# Patient Record
Sex: Female | Born: 1940 | Race: White | Hispanic: No | Smoking: Never smoker
Health system: Southern US, Community
[De-identification: ages and names within clinical notes are randomized; demographics above are authoritative.]

## PROBLEM LIST (undated history)

## (undated) DIAGNOSIS — T7840XA Allergy, unspecified, initial encounter: Secondary | ICD-10-CM

## (undated) DIAGNOSIS — I10 Essential (primary) hypertension: Secondary | ICD-10-CM

## (undated) HISTORY — PX: NO PAST SURGERIES: SHX2092

## (undated) HISTORY — DX: Allergy, unspecified, initial encounter: T78.40XA

## (undated) HISTORY — DX: Essential (primary) hypertension: I10

---

## 2005-11-29 ENCOUNTER — Emergency Department (HOSPITAL_COMMUNITY): Admission: EM | Admit: 2005-11-29 | Discharge: 2005-11-29 | Payer: Self-pay | Admitting: Emergency Medicine

## 2006-10-23 ENCOUNTER — Ambulatory Visit: Payer: Self-pay | Admitting: Family Medicine

## 2006-10-23 DIAGNOSIS — I10 Essential (primary) hypertension: Secondary | ICD-10-CM

## 2006-10-23 DIAGNOSIS — J309 Allergic rhinitis, unspecified: Secondary | ICD-10-CM | POA: Insufficient documentation

## 2006-10-26 LAB — CONVERTED CEMR LAB
BUN: 15 mg/dL (ref 6–23)
CO2: 33 meq/L — ABNORMAL HIGH (ref 19–32)
Calcium: 9.2 mg/dL (ref 8.4–10.5)
Chloride: 104 meq/L (ref 96–112)
Creatinine, Ser: 0.9 mg/dL (ref 0.4–1.2)

## 2007-01-11 ENCOUNTER — Encounter: Admission: RE | Admit: 2007-01-11 | Discharge: 2007-01-11 | Payer: Self-pay | Admitting: Family Medicine

## 2007-01-23 ENCOUNTER — Encounter: Payer: Self-pay | Admitting: Family Medicine

## 2007-02-06 ENCOUNTER — Encounter: Payer: Self-pay | Admitting: Family Medicine

## 2007-02-06 ENCOUNTER — Ambulatory Visit: Payer: Self-pay | Admitting: Family Medicine

## 2007-02-06 ENCOUNTER — Other Ambulatory Visit: Admission: RE | Admit: 2007-02-06 | Discharge: 2007-02-06 | Payer: Self-pay | Admitting: Family Medicine

## 2007-02-06 DIAGNOSIS — K921 Melena: Secondary | ICD-10-CM

## 2007-02-07 LAB — CONVERTED CEMR LAB
Alkaline Phosphatase: 109 units/L (ref 39–117)
Bilirubin, Direct: 0.1 mg/dL (ref 0.0–0.3)
CO2: 29 meq/L (ref 19–32)
Chloride: 103 meq/L (ref 96–112)
Creatinine, Ser: 0.8 mg/dL (ref 0.4–1.2)
Eosinophils Absolute: 0.1 10*3/uL (ref 0.0–0.6)
GFR calc Af Amer: 92 mL/min
HCT: 43.1 % (ref 36.0–46.0)
LDL Cholesterol: 89 mg/dL (ref 0–99)
MCHC: 33.6 g/dL (ref 30.0–36.0)
MCV: 89.1 fL (ref 78.0–100.0)
Monocytes Relative: 6.3 % (ref 3.0–11.0)
Neutrophils Relative %: 74.4 % (ref 43.0–77.0)
RBC: 4.84 M/uL (ref 3.87–5.11)
RDW: 12.1 % (ref 11.5–14.6)
Sodium: 140 meq/L (ref 135–145)
Total CHOL/HDL Ratio: 3.3
Triglycerides: 126 mg/dL (ref 0–149)
VLDL: 25 mg/dL (ref 0–40)

## 2007-02-10 DIAGNOSIS — R87619 Unspecified abnormal cytological findings in specimens from cervix uteri: Secondary | ICD-10-CM

## 2007-05-18 ENCOUNTER — Telehealth (INDEPENDENT_AMBULATORY_CARE_PROVIDER_SITE_OTHER): Payer: Self-pay | Admitting: *Deleted

## 2007-05-20 ENCOUNTER — Emergency Department (HOSPITAL_COMMUNITY): Admission: EM | Admit: 2007-05-20 | Discharge: 2007-05-20 | Payer: Self-pay | Admitting: Family Medicine

## 2008-01-23 ENCOUNTER — Encounter: Payer: Self-pay | Admitting: Family Medicine

## 2008-01-24 ENCOUNTER — Ambulatory Visit: Payer: Self-pay | Admitting: Family Medicine

## 2008-01-25 ENCOUNTER — Encounter: Payer: Self-pay | Admitting: Family Medicine

## 2008-01-28 ENCOUNTER — Encounter (INDEPENDENT_AMBULATORY_CARE_PROVIDER_SITE_OTHER): Payer: Self-pay | Admitting: *Deleted

## 2008-01-30 ENCOUNTER — Encounter (INDEPENDENT_AMBULATORY_CARE_PROVIDER_SITE_OTHER): Payer: Self-pay | Admitting: *Deleted

## 2008-01-30 LAB — CONVERTED CEMR LAB
BUN: 17 mg/dL (ref 6–23)
Basophils Relative: 0.1 % (ref 0.0–3.0)
Bilirubin, Direct: 0.1 mg/dL (ref 0.0–0.3)
Calcium: 9.6 mg/dL (ref 8.4–10.5)
Chloride: 105 meq/L (ref 96–112)
Creatinine, Ser: 1.1 mg/dL (ref 0.4–1.2)
Eosinophils Absolute: 0.1 10*3/uL (ref 0.0–0.7)
GFR calc non Af Amer: 52 mL/min
Glucose, Bld: 117 mg/dL — ABNORMAL HIGH (ref 70–99)
HCT: 44 % (ref 36.0–46.0)
Hemoglobin: 15 g/dL (ref 12.0–15.0)
Lymphocytes Relative: 15.9 % (ref 12.0–46.0)
MCV: 88 fL (ref 78.0–100.0)
Neutro Abs: 6.2 10*3/uL (ref 1.4–7.7)
Neutrophils Relative %: 77.2 % — ABNORMAL HIGH (ref 43.0–77.0)
Platelets: 256 10*3/uL (ref 150–400)
RDW: 12.2 % (ref 11.5–14.6)
TSH: 2.63 microintl units/mL (ref 0.35–5.50)
Total CHOL/HDL Ratio: 3.2
Triglycerides: 130 mg/dL (ref 0–149)
WBC: 8.1 10*3/uL (ref 4.5–10.5)

## 2008-10-16 ENCOUNTER — Ambulatory Visit: Payer: Self-pay | Admitting: Family Medicine

## 2008-10-23 ENCOUNTER — Ambulatory Visit: Payer: Self-pay | Admitting: Family Medicine

## 2008-10-23 LAB — CONVERTED CEMR LAB
AST: 19 units/L (ref 0–37)
BUN: 20 mg/dL (ref 6–23)
Bilirubin, Direct: 0.1 mg/dL (ref 0.0–0.3)
Chloride: 108 meq/L (ref 96–112)
Cholesterol: 152 mg/dL (ref 0–200)
GFR calc non Af Amer: 47.19 mL/min (ref >60)
Glucose, Bld: 88 mg/dL (ref 70–99)
LDL Cholesterol: 78 mg/dL (ref 0–99)
Potassium: 4.2 meq/L (ref 3.5–5.1)
Total Bilirubin: 0.9 mg/dL (ref 0.3–1.2)
Total CHOL/HDL Ratio: 3
Total Protein: 6.9 g/dL (ref 6.0–8.3)

## 2008-10-27 ENCOUNTER — Encounter (INDEPENDENT_AMBULATORY_CARE_PROVIDER_SITE_OTHER): Payer: Self-pay | Admitting: *Deleted

## 2009-05-25 ENCOUNTER — Telehealth (INDEPENDENT_AMBULATORY_CARE_PROVIDER_SITE_OTHER): Payer: Self-pay | Admitting: *Deleted

## 2009-06-04 ENCOUNTER — Ambulatory Visit: Payer: Self-pay | Admitting: Family Medicine

## 2009-10-23 ENCOUNTER — Ambulatory Visit: Payer: Self-pay | Admitting: Family Medicine

## 2009-10-23 DIAGNOSIS — R7309 Other abnormal glucose: Secondary | ICD-10-CM

## 2009-11-02 LAB — CONVERTED CEMR LAB
ALT: 16 units/L (ref 0–35)
AST: 21 units/L (ref 0–37)
BUN: 20 mg/dL (ref 6–23)
Chloride: 106 meq/L (ref 96–112)
Cholesterol: 166 mg/dL (ref 0–200)
Eosinophils Absolute: 0.1 10*3/uL (ref 0.0–0.7)
Eosinophils Relative: 1.7 % (ref 0.0–5.0)
GFR calc non Af Amer: 57.41 mL/min (ref >60)
Glucose, Bld: 102 mg/dL — ABNORMAL HIGH (ref 70–99)
LDL Cholesterol: 85 mg/dL (ref 0–99)
Lymphocytes Relative: 22.1 % (ref 12.0–46.0)
Lymphs Abs: 1.2 10*3/uL (ref 0.7–4.0)
MCHC: 34.1 g/dL (ref 30.0–36.0)
Monocytes Absolute: 0.5 10*3/uL (ref 0.1–1.0)
Monocytes Relative: 9.4 % (ref 3.0–12.0)
Neutro Abs: 3.7 10*3/uL (ref 1.4–7.7)
RBC: 4.42 M/uL (ref 3.87–5.11)
Total Bilirubin: 0.7 mg/dL (ref 0.3–1.2)
Total Protein: 6.9 g/dL (ref 6.0–8.3)
Triglycerides: 114 mg/dL (ref 0.0–149.0)
WBC: 5.6 10*3/uL (ref 4.5–10.5)

## 2010-05-06 NOTE — Progress Notes (Signed)
Summary: Phone  Phone Note Call from Patient Call back at [phone]   Caller: Patient Summary of Call: Patient requesting labs stated she requested a med refill on amlodpine. Please advise which labs Initial call taken by: Silva Bandy,  May 25, 2009 12:33 PM  Follow-up for Phone Call        pt need OV per note attached to refills. labs not due until 10-23-09, last OV 10-16-08.Marland KitchenMarland KitchenFelecia Deloach CMA  May 25, 2009 12:46 PM     Additional Follow-up for Phone Call Additional follow up Details #2::    patient has an ov scheduled for Feb 24,2011 and labs scheduled on July 22,2011 Follow-up by: Silva Bandy,  May 25, 2009 1:07 PM

## 2010-05-06 NOTE — Assessment & Plan Note (Signed)
Summary: [facility]/LABS-790.6,272.4,401.9,LIP,BMP,HEP,HGBA1C--PH   Vital Signs:  Patient profile:   70 year old female Height:      62.75 inches Weight:      124 pounds BMI:     22.22 Temp:     98.3 degrees F oral Pulse rate:   68 / minute BP sitting:   130 / 76  (left arm)  Vitals Entered By: Rolla Flatten CMA (October 23, 2009 8:12 AM) CC: fasting labs   History of Present Illness:  Hypertension follow-up      This is a 70 year old woman who presents for Hypertension follow-up.  The patient denies lightheadedness, urinary frequency, headaches, edema, impotence, rash, and fatigue.  The patient denies the following associated symptoms: chest pain, chest pressure, exercise intolerance, dyspnea, palpitations, syncope, leg edema, and pedal edema.  Compliance with medications (by patient report) has been near 100%.  The patient reports that dietary compliance has been good.  The patient reports exercising 3-4X per week.  Adjunctive measures currently used by the patient include salt restriction.    Current Medications (verified): 1)  Norvasc 10 Mg  Tabs (Amlodipine Besylate) .Marland Kitchen.. 1 Once Daily. 2)  Lisinopril 20 Mg Tabs (Lisinopril) .Marland Kitchen.. 1 By Mouth Once Daily 3)  Ibuprofen 200 Mg Caps (Ibuprofen) 4)  Ranitidine Hcl 150 Mg Tabs (Ranitidine Hcl)  Allergies (verified): No Known Drug Allergies  Past History:  Past Medical History: Last updated: 10/23/2006 Allergic rhinitis Hypertension  Past Surgical History: Last updated: 10/23/2006 Denies surgical history  Family History: Last updated: 10/23/2006 Family History Hypertension- MOTHER AND FATHER  Family History of Arthritis-FATHER Family History Depression-MOTHER  Social History: Last updated: 10/23/2009  Single Never Smoked Alcohol use-no Drug use-no  Regular exercise-no  Risk Factors: Caffeine Use: 0 (06/04/2009) Exercise: no (06/04/2009)  Risk Factors: Smoking Status: never (06/04/2009) Passive Smoke Exposure:  no (06/04/2009)  Family History: Reviewed history from 10/23/2006 and no changes required. Family History Hypertension- MOTHER AND FATHER  Family History of Arthritis-FATHER Family History Depression-MOTHER  Social History:  Single Never Smoked Alcohol use-no Drug use-no  Regular exercise-no  Review of Systems      See HPI  Physical Exam  General:  Well-developed,well-nourished,in no acute distress; alert,appropriate and cooperative throughout examination Neck:  No deformities, masses, or tenderness noted. Lungs:  Normal respiratory effort, chest expands symmetrically. Lungs are clear to auscultation, no crackles or wheezes. Heart:  normal rate and no murmur.   Extremities:  No clubbing, cyanosis, edema, or deformity noted with normal full range of motion of all joints.   Psych:  Oriented X3 and flat affect.     Impression & Recommendations:  Problem # 1:  HYPERTENSION (ICD-401.9)  Her updated medication list for this problem includes:    Norvasc 10 Mg Tabs (Amlodipine besylate) .Marland Kitchen... 1 once daily.    Lisinopril 20 Mg Tabs (Lisinopril) .Marland Kitchen... 1 by mouth once daily  Orders: Venipuncture (40768) TLB-Lipid Panel (80061-LIPID) TLB-BMP (Basic Metabolic Panel-BMET) (08811-SRPRXYV) TLB-CBC Platelet - w/Differential (85025-CBCD) TLB-Hepatic/Liver Function Pnl (80076-HEPATIC) TLB-A1C / Hgb A1C (Glycohemoglobin) (83036-A1C)  BP today: 130/76 Prior BP: 124/78 (06/04/2009)  Labs Reviewed: K+: 4.2 (10/23/2008) Creat: : 1.2 (10/23/2008)   Chol: 152 (10/23/2008)   HDL: 59.30 (10/23/2008)   LDL: 78 (10/23/2008)   TG: 76.0 (10/23/2008)  Problem # 2:  [place] (ICD-V70.0) pt refused vaccines, pap, mammo, bmd, colon or ifob.  Complete Medication List: 1)  Norvasc 10 Mg Tabs (Amlodipine besylate) .Marland Kitchen.. 1 once daily. 2)  Lisinopril 20 Mg Tabs (Lisinopril) .Marland KitchenMarland KitchenMarland Kitchen  1 by mouth once daily 3)  Ibuprofen 200 Mg Caps (Ibuprofen) 4)  Ranitidine Hcl 150 Mg Tabs (Ranitidine  hcl) Prescriptions: NORVASC 10 MG  TABS (AMLODIPINE BESYLATE) 1 once daily.  #30 x 5   Entered and Authorized by:   [name] DO   Signed by:   [name] DO on 10/23/2009   Method used:   Electronically to        [facility] (retail)       [address]       [place], [place]  [address]       Ph: [phone]       Fax: [phone]   RxID:   [id number]   Flu Vaccine Next Due:  Refused TD Next Due:  Refused Pneumovax Next Due:  Refused Herpes Zoster Next Due:  Not Indicated Last Colonoscopy:  Normal (10/09/1991 10:49:15 AM) Colonoscopy Next Due:  Refused Last PAP:  Normal (10/13/1999 10:49:45 AM) PAP Next Due:  Not Indicated Last Mammogram:  Abnormal Right (01/16/2007 10:28:50 AM) Mammogram Next Due:  Refused Bone Density Next Due: Refused    Appended Document: [facility]/LABS-790.6,272.4,401.9,LIP,BMP,HEP,HGBA1C--PH    Clinical Lists Changes  Orders: Added new Service order of Specimen Handling (99000) - Signed      Appended Document: [facility]/LABS-790.6,272.4,401.9,LIP,BMP,HEP,HGBA1C--PH    Clinical Lists Changes  Orders: Added new Service order of UA Dipstick w/o Micro (manual) (81002) - Signed Observations: Added new observation of [place] URINE: 5.0  (10/23/2009 9:20) Added new observation of SPEC GR URIN: 1.010  (10/23/2009 9:20) Added new observation of WBC DIPSTK U: negative  (10/23/2009 9:20) Added new observation of NITRITE URN: negative  (10/23/2009 9:20) Added new observation of UROBILINOGEN: 0.2  (10/23/2009 9:20) Added new observation of PROTEIN, URN: negative  (10/23/2009 9:20) Added new observation of BLOOD UR DIP: negative  (10/23/2009 9:20) Added new observation of KETONES URN: negative  (10/23/2009 9:20) Added new observation of BILIRUBIN UR: negative  (10/23/2009 9:20) Added new observation of GLUCOSE, URN: negative  (10/23/2009 9:20)      Laboratory Results   Urine Tests    Routine  Urinalysis   Glucose: negative   (Normal Range: Negative) Bilirubin: negative   (Normal Range: Negative) Ketone: negative   (Normal Range: Negative) Spec. Gravity: 1.010   (Normal Range: 1.003-1.035) Blood: negative   (Normal Range: Negative) pH: 5.0   (Normal Range: 5.0-8.0) Protein: negative   (Normal Range: Negative) Urobilinogen: 0.2   (Normal Range: 0-1) Nitrite: negative   (Normal Range: Negative) Leukocyte Esterace: negative   (Normal Range: Negative)

## 2010-05-06 NOTE — Assessment & Plan Note (Signed)
Summary: ov for refills/kdc   Vital Signs:  Patient profile:   70 year old female Weight:      117 pounds Temp:     97.4 degrees F oral Pulse rate:   76 / minute Pulse rhythm:   regular BP sitting:   124 / 78  (left arm) Cuff size:   regular  Vitals Entered By: Allyn Kenner CMA (June 04, 2009 11:04 AM) CC: refill on meds    History of Present Illness:  Hypertension follow-up      This is a 70 year old woman who presents for Hypertension follow-up.  The patient denies lightheadedness, urinary frequency, headaches, edema, impotence, rash, and fatigue.  The patient denies the following associated symptoms: chest pain, chest pressure, exercise intolerance, dyspnea, palpitations, syncope, leg edema, and pedal edema.  Compliance with medications (by patient report) has been near 100%.  The patient reports that dietary compliance has been good.  The patient reports exercising 3-4X per week.  Adjunctive measures currently used by the patient include salt restriction.    Preventive Screening-Counseling & Management  Alcohol-Tobacco     Smoking Status: never     Passive Smoke Exposure: no  Caffeine-Diet-Exercise     Caffeine use/day: 0     Does Patient Exercise: no     Type of exercise: walking     Times/week: <3  Current Medications (verified): 1)  Norvasc 10 Mg  Tabs (Amlodipine Besylate) .Marland Kitchen.. 1 Once Daily. 2)  Lisinopril 20 Mg Tabs (Lisinopril) .Marland Kitchen.. 1 By Mouth Once Daily 3)  Ibuprofen 200 Mg Caps (Ibuprofen) 4)  Ranitidine Hcl 150 Mg Tabs (Ranitidine Hcl)  Allergies (verified): No Known Drug Allergies  Past History:  Past medical, surgical, family and social histories (including risk factors) reviewed for relevance to current acute and chronic problems.  Past Medical History: Reviewed history from 10/23/2006 and no changes required. Allergic rhinitis Hypertension  Past Surgical History: Reviewed history from 10/23/2006 and no changes required. Denies surgical  history  Family History: Reviewed history from 10/23/2006 and no changes required. Family History Hypertension- MOTHER AND FATHER  Family History of Arthritis-FATHER Family History Depression-MOTHER  Social History: Reviewed history from 01/24/2008 and no changes required. Occupation:  [employer] Single Never Smoked Alcohol use-no Drug use-no  Regular exercise-no Does Patient Exercise:  no  Review of Systems      See HPI  Physical Exam  General:  Well-developed,well-nourished,in no acute distress; alert,appropriate and cooperative throughout examination Lungs:  Normal respiratory effort, chest expands symmetrically. Lungs are clear to auscultation, no crackles or wheezes. Heart:  normal rate and no murmur.   Extremities:  No clubbing, cyanosis, edema, or deformity noted with normal full range of motion of all joints.   Psych:  Oriented X3 and normally interactive.     Impression & Recommendations:  Problem # 1:  HYPERTENSION (ICD-401.9)  Her updated medication list for this problem includes:    Norvasc 10 Mg Tabs (Amlodipine besylate) .Marland Kitchen... 1 once daily.    Lisinopril 20 Mg Tabs (Lisinopril) .Marland Kitchen... 1 by mouth once daily  BP today: 124/78 Prior BP: 124/72 (10/16/2008)  Labs Reviewed: K+: 4.2 (10/23/2008) Creat: : 1.2 (10/23/2008)   Chol: 152 (10/23/2008)   HDL: 59.30 (10/23/2008)   LDL: 78 (10/23/2008)   TG: 76.0 (10/23/2008)  Complete Medication List: 1)  Norvasc 10 Mg Tabs (Amlodipine besylate) .Marland Kitchen.. 1 once daily. 2)  Lisinopril 20 Mg Tabs (Lisinopril) .Marland Kitchen.. 1 by mouth once daily 3)  Ibuprofen 200 Mg Caps (Ibuprofen)  4)  Ranitidine Hcl 150 Mg Tabs (Ranitidine hcl)  Patient Instructions: 1)  cpe july Prescriptions: NORVASC 10 MG  TABS (AMLODIPINE BESYLATE) 1 once daily.  #30 x 5   Entered and Authorized by:   [name] DO   Signed by:   [name] DO on 06/04/2009   Method used:   Electronically to        [facility] (retail)        [address]       [place], [place]  [address]       Ph: [phone]       Fax: [phone]   RxID:   [id number]

## 2010-05-17 ENCOUNTER — Encounter (INDEPENDENT_AMBULATORY_CARE_PROVIDER_SITE_OTHER): Payer: Self-pay | Admitting: *Deleted

## 2010-05-24 ENCOUNTER — Ambulatory Visit: Payer: Self-pay | Admitting: Family Medicine

## 2010-05-26 NOTE — Letter (Signed)
Summary: Primary Care Appointment Letter  [facility]   [place], [place] [address]   Phone: [phone]  Fax: [phone]    05/17/2010 MRN: [id number]    [facility] [place], [place]  [address]     Dear [name],     Your Primary Care Physician [name] DO has indicated that:    ___X____it is time to schedule an appointment for your Annual physical with fastin labs.    _______you missed your appointment on______ and need to call and          reschedule.    _______you need to have lab work done.    _______you need to schedule an appointment discuss lab or test results.    _______you need to call to reschedule your appointment that is                       scheduled on _________.     Please call our office as soon as possible. Our phone number is [phone]. Please press option 1. Our office is open 8a-5p, Monday through Friday.     Thank you,  [place]

## 2010-05-27 ENCOUNTER — Encounter: Payer: Self-pay | Admitting: Family Medicine

## 2010-05-27 ENCOUNTER — Ambulatory Visit (INDEPENDENT_AMBULATORY_CARE_PROVIDER_SITE_OTHER): Payer: Medicare Other | Admitting: Family Medicine

## 2010-05-27 ENCOUNTER — Other Ambulatory Visit: Payer: Self-pay | Admitting: Family Medicine

## 2010-05-27 DIAGNOSIS — I10 Essential (primary) hypertension: Secondary | ICD-10-CM

## 2010-05-27 DIAGNOSIS — R7309 Other abnormal glucose: Secondary | ICD-10-CM

## 2010-05-27 LAB — HEPATIC FUNCTION PANEL
ALT: 20 U/L (ref 0–35)
AST: 20 U/L (ref 0–37)
Albumin: 3.9 g/dL (ref 3.5–5.2)
Alkaline Phosphatase: 84 U/L (ref 39–117)
Bilirubin, Direct: 0.1 mg/dL (ref 0.0–0.3)
Total Bilirubin: 0.6 mg/dL (ref 0.3–1.2)
Total Protein: 6.5 g/dL (ref 6.0–8.3)

## 2010-05-27 LAB — BASIC METABOLIC PANEL WITH GFR
BUN: 21 mg/dL (ref 6–23)
CO2: 30 meq/L (ref 19–32)
Calcium: 9.7 mg/dL (ref 8.4–10.5)
Chloride: 106 meq/L (ref 96–112)
Creatinine, Ser: 1.1 mg/dL (ref 0.4–1.2)
GFR: 54.8 mL/min — ABNORMAL LOW (ref >60.00)
Glucose, Bld: 89 mg/dL (ref 70–99)
Potassium: 4.7 meq/L (ref 3.5–5.1)
Sodium: 140 meq/L (ref 135–145)

## 2010-05-27 LAB — HEMOGLOBIN A1C: Hgb A1c MFr Bld: 5.9 % (ref 4.6–6.5)

## 2010-05-27 LAB — LIPID PANEL: LDL Cholesterol: 84 mg/dL (ref 0–99)

## 2010-06-01 NOTE — Assessment & Plan Note (Signed)
Summary: med refill/cbs   Vital Signs:  Patient profile:   70 year old female Weight:      117.4 pounds Pulse rate:   76 / minute Pulse rhythm:   regular BP sitting:   114 / 78  (left arm) Cuff size:   regular  Vitals Entered By: Aron Baba CMA Deborra Medina) (May 27, 2010 9:57 AM) CC: ROV---MED REVIEW AND FASTING LABS   History of Present Illness:  Hypertension follow-up      This is a 70 year old woman who presents for Hypertension follow-up.  The patient denies lightheadedness, urinary frequency, headaches, edema, impotence, rash, and fatigue.  The patient denies the following associated symptoms: chest pain, chest pressure, exercise intolerance, dyspnea, palpitations, syncope, leg edema, and pedal edema.  Compliance with medications (by patient report) has been near 100%.  The patient reports that dietary compliance has been good.  The patient reports exercising occasionally.  Adjunctive measures currently used by the patient include salt restriction.    Current Medications (verified): 1)  Norvasc 10 Mg  Tabs (Amlodipine Besylate) .Marland Kitchen.. 1 Once Daily. Labs Are Due Now_call For and Appointment! 2)  Lisinopril 20 Mg Tabs (Lisinopril) .Marland Kitchen.. 1 By Mouth Once Daily 3)  Ibuprofen 200 Mg Caps (Ibuprofen)  Allergies (verified): No Known Drug Allergies  Physical Exam  General:  Well-developed,well-nourished,in no acute distress; alert,appropriate and cooperative throughout examination Lungs:  Normal respiratory effort, chest expands symmetrically. Lungs are clear to auscultation, no crackles or wheezes. Heart:  normal rate and no murmur.   Extremities:  No clubbing, cyanosis, edema, or deformity noted with normal full range of motion of all joints.   Psych:  Oriented X3 and normally interactive.     Impression & Recommendations:  Problem # 1:  HYPERGLYCEMIA (ICD-790.29)  Orders: Venipuncture (77939) TLB-Lipid Panel (80061-LIPID) TLB-BMP (Basic Metabolic Panel-BMET)  (03009-QZRAQTM) TLB-Hepatic/Liver Function Pnl (80076-HEPATIC) TLB-A1C / Hgb A1C (Glycohemoglobin) (83036-A1C) Specimen Handling (99000)  Labs Reviewed: Creat: 1.0 (10/23/2009)     Problem # 2:  HYPERTENSION (ICD-401.9)  Her updated medication list for this problem includes:    Norvasc 10 Mg Tabs (Amlodipine besylate) .Marland Kitchen... 1 once daily.    Lisinopril 20 Mg Tabs (Lisinopril) .Marland Kitchen... 1 by mouth once daily  Orders: Venipuncture (22633) TLB-Lipid Panel (80061-LIPID) TLB-BMP (Basic Metabolic Panel-BMET) (35456-YBWLSLH) TLB-Hepatic/Liver Function Pnl (80076-HEPATIC) TLB-A1C / Hgb A1C (Glycohemoglobin) (83036-A1C) Specimen Handling (99000)  BP today: 114/78 Prior BP: 130/76 (10/23/2009)  Labs Reviewed: K+: 4.7 (10/23/2009) Creat: : 1.0 (10/23/2009)   Chol: 166 (10/23/2009)   HDL: 58.00 (10/23/2009)   LDL: 85 (10/23/2009)   TG: 114.0 (10/23/2009)  Complete Medication List: 1)  Norvasc 10 Mg Tabs (Amlodipine besylate) .Marland Kitchen.. 1 once daily. 2)  Lisinopril 20 Mg Tabs (Lisinopril) .Marland Kitchen.. 1 by mouth once daily 3)  Ibuprofen 200 Mg Caps (Ibuprofen)  Patient Instructions: 1)  Please schedule a follow-up appointment in 6 months .  Prescriptions: NORVASC 10 MG  TABS (AMLODIPINE BESYLATE) 1 once daily.  #90 x 3   Entered and Authorized by:   [name] DO   Signed by:   [name] DO on 05/27/2010   Method used:   Electronically to        [facility] (retail)       [address]       [place], [place]  [address]       Ph: [phone]       Fax: [phone]   RxID:  [id number] LISINOPRIL 20 MG TABS (LISINOPRIL) 1 by mouth once daily  #90 Tablet x 3   Entered and Authorized by:   [name] DO   Signed by:   [name] DO on 05/27/2010   Method used:   Electronically to        [facility] (retail)       [address]       [place], [place]  [address]       Ph: [phone]       Fax: [phone]    RxID:   [id number]    Orders Added: 1)  Venipuncture [78412] 2)  TLB-Lipid Panel [80061-LIPID] 3)  TLB-BMP (Basic Metabolic Panel-BMET) [82081-NGITJLL] 4)  TLB-Hepatic/Liver Function Pnl [80076-HEPATIC] 5)  TLB-A1C / Hgb A1C (Glycohemoglobin) [83036-A1C] 6)  Specimen Handling [99000] 7)  Est. Patient Level III [97471]

## 2010-11-01 ENCOUNTER — Ambulatory Visit (INDEPENDENT_AMBULATORY_CARE_PROVIDER_SITE_OTHER): Payer: Medicare Other | Admitting: Family Medicine

## 2010-11-01 ENCOUNTER — Encounter: Payer: Self-pay | Admitting: Family Medicine

## 2010-11-01 VITALS — BP 132/74 | Temp 99.2°F | Ht 62.0 in | Wt 117.4 lb

## 2010-11-01 DIAGNOSIS — R05 Cough: Secondary | ICD-10-CM | POA: Insufficient documentation

## 2010-11-01 MED ORDER — BENZONATATE 200 MG PO CAPS: 200.0000 mg | ORAL_CAPSULE | Freq: Three times a day (TID) | ORAL | Status: AC | PRN

## 2010-11-01 MED ORDER — AZITHROMYCIN 250 MG PO TABS: 250.0000 mg | ORAL_TABLET | Freq: Every day | ORAL | Status: AC

## 2010-11-01 NOTE — Patient Instructions (Signed)
Start the Azithromycin for possible pneumonia Go to [facility] tomorrow and get your chest xray- we'll call you with the results Use the cough pills as needed Continue to alternate the tylenol and ibuprofen every 4 hours as needed for pain relief Use a heating pad for pain relief Call with any questions or concerns Hang in there!!!

## 2010-11-01 NOTE — Progress Notes (Signed)
  Subjective:    Patient ID: Morgan Morrow, female    DOB: [birth year], 70 y.o.   MRN: [id number]  HPI Chest congestion- sxs started 10-11 days ago after traveling.  Pt reports cough is very 'wet and loose'.  Denies recent fevers.  Reports this AM developed severe thoracic back pain on R side.  Denies ear pain, sore throat, facial pain/pressure.  Has been alternating tylenol and ibuprofen for pain.   Review of Systems For ROS see HPI     Objective:   Physical Exam  Vitals reviewed. Constitutional: She appears well-developed and well-nourished. No distress.  HENT:  Head: Normocephalic and atraumatic.       TMs normal bilaterally no nasal congestion Throat w/out erythema, edema, or exudate  Eyes: Conjunctivae and EOM are normal. Pupils are equal, round, and reactive to light.  Neck: Normal range of motion. Neck supple.  Cardiovascular: Normal rate, regular rhythm and intact distal pulses.   Pulmonary/Chest: Effort normal. No respiratory distress. She has no wheezes. She has rales (coarse BS, R>L).       + loose,hacking cough  Musculoskeletal: She exhibits no tenderness (no TTP over thoracic spine, + pain w/ cough).  Lymphadenopathy:    She has no cervical adenopathy.          Assessment & Plan:   No problem-specific assessment & plan notes found for this encounter.

## 2010-11-02 ENCOUNTER — Ambulatory Visit (INDEPENDENT_AMBULATORY_CARE_PROVIDER_SITE_OTHER)
Admission: RE | Admit: 2010-11-02 | Discharge: 2010-11-02 | Disposition: A | Discharge status: Home / Self Care | Payer: Medicare Other | Source: Ambulatory Visit | Attending: Family Medicine | Admitting: Family Medicine

## 2010-11-02 DIAGNOSIS — R05 Cough: Secondary | ICD-10-CM

## 2010-11-09 NOTE — Assessment & Plan Note (Signed)
Given pt's duration of illness, character of cough, and sudden back pain there is a high suspicion for PNA.  Start cough meds and abx.  Send pt for CXR.  Reviewed supportive care and red flags that should prompt return.  Pt expressed understanding and is in agreement w/ plan.

## 2011-06-02 ENCOUNTER — Other Ambulatory Visit: Payer: Self-pay | Admitting: Family Medicine

## 2011-06-02 NOTE — Telephone Encounter (Signed)
Letter mailed to schedule and OV     KP

## 2011-07-08 ENCOUNTER — Other Ambulatory Visit: Payer: Self-pay | Admitting: Family Medicine

## 2011-07-27 ENCOUNTER — Encounter: Payer: Self-pay | Admitting: Family Medicine

## 2011-07-27 ENCOUNTER — Ambulatory Visit (INDEPENDENT_AMBULATORY_CARE_PROVIDER_SITE_OTHER): Payer: Medicare Other | Admitting: Family Medicine

## 2011-07-27 VITALS — BP 122/78 | HR 88 | Temp 97.8°F | Ht 62.0 in | Wt 120.6 lb

## 2011-07-27 DIAGNOSIS — I10 Essential (primary) hypertension: Secondary | ICD-10-CM

## 2011-07-27 MED ORDER — AMLODIPINE BESYLATE 10 MG PO TABS: 10.0000 mg | ORAL_TABLET | Freq: Every day | ORAL | Status: DC

## 2011-07-27 MED ORDER — LISINOPRIL 20 MG PO TABS: 20.0000 mg | ORAL_TABLET | Freq: Every day | ORAL | Status: DC

## 2011-07-27 NOTE — Assessment & Plan Note (Signed)
Chronic problem.  Well controlled.  Asymptomatic.  Needs CPE w/ PCP.  No changes.

## 2011-07-27 NOTE — Patient Instructions (Signed)
Schedule your complete physical w/ Dr Etter Sjogren You look good!  Keep it up! Call with any questions or concerns [place]!!!

## 2011-07-27 NOTE — Progress Notes (Signed)
  Subjective:    Patient ID: Samayah Novinger, female    DOB: 02-13-1941, 71 y.o.   MRN: [id number]  HPI HTN- chronic problem for pt, excellent control on Amlodipine and Lisinopril.  No CP, SOB, HAs, visual changes, edema.  Overdue for CPE.  No concerns about health.   Review of Systems For ROS see HPI     Objective:   Physical Exam  Vitals reviewed. Constitutional: She is oriented to person, place, and time. She appears well-developed and well-nourished. No distress.  HENT:  Head: Normocephalic and atraumatic.  Eyes: Conjunctivae and EOM are normal. Pupils are equal, round, and reactive to light.  Neck: Normal range of motion. Neck supple. No thyromegaly present.  Cardiovascular: Normal rate, regular rhythm, normal heart sounds and intact distal pulses.   No murmur heard. Pulmonary/Chest: Effort normal and breath sounds normal. No respiratory distress.  Abdominal: Soft. She exhibits no distension. There is no tenderness.  Musculoskeletal: She exhibits no edema.  Lymphadenopathy:    She has no cervical adenopathy.  Neurological: She is alert and oriented to person, place, and time.  Skin: Skin is warm and dry.  Psychiatric: She has a normal mood and affect. Her behavior is normal.          Assessment & Plan:

## 2011-09-16 ENCOUNTER — Encounter: Payer: Self-pay | Admitting: Family Medicine

## 2011-09-16 ENCOUNTER — Ambulatory Visit (INDEPENDENT_AMBULATORY_CARE_PROVIDER_SITE_OTHER): Payer: Medicare Other | Admitting: Family Medicine

## 2011-09-16 VITALS — BP 134/72 | HR 75 | Temp 97.3°F | Ht 61.5 in | Wt 121.8 lb

## 2011-09-16 DIAGNOSIS — R7309 Other abnormal glucose: Secondary | ICD-10-CM

## 2011-09-16 DIAGNOSIS — R739 Hyperglycemia, unspecified: Secondary | ICD-10-CM

## 2011-09-16 DIAGNOSIS — Z0000 Encounter for general adult medical examination without abnormal findings: Secondary | ICD-10-CM

## 2011-09-16 DIAGNOSIS — I10 Essential (primary) hypertension: Secondary | ICD-10-CM

## 2011-09-16 LAB — CBC WITH DIFFERENTIAL/PLATELET
Hemoglobin: 13 g/dL (ref 12.0–15.0)
Monocytes Absolute: 0.4 10*3/uL (ref 0.1–1.0)
Neutrophils Relative %: 66.2 % (ref 43.0–77.0)
Platelets: 199 10*3/uL (ref 150.0–400.0)
RBC: 4.42 Mil/uL (ref 3.87–5.11)
RDW: 13.7 % (ref 11.5–14.6)
WBC: 5.4 10*3/uL (ref 4.5–10.5)

## 2011-09-16 LAB — BASIC METABOLIC PANEL
Calcium: 9.4 mg/dL (ref 8.4–10.5)
Chloride: 106 mEq/L (ref 96–112)
Creatinine, Ser: 1.1 mg/dL (ref 0.4–1.2)
Glucose, Bld: 85 mg/dL (ref 70–99)
Sodium: 140 mEq/L (ref 135–145)

## 2011-09-16 LAB — HEPATIC FUNCTION PANEL
ALT: 16 U/L (ref 0–35)
Albumin: 3.7 g/dL (ref 3.5–5.2)
Alkaline Phosphatase: 85 U/L (ref 39–117)
Bilirubin, Direct: 0.1 mg/dL (ref 0.0–0.3)
Total Protein: 6.9 g/dL (ref 6.0–8.3)

## 2011-09-16 LAB — LIPID PANEL
Cholesterol: 157 mg/dL (ref 0–200)
LDL Cholesterol: 75 mg/dL (ref 0–99)
VLDL: 17.2 mg/dL (ref 0.0–40.0)

## 2011-09-16 MED ORDER — LISINOPRIL 20 MG PO TABS: 20.0000 mg | ORAL_TABLET | Freq: Every day | ORAL | Status: DC

## 2011-09-16 MED ORDER — AMLODIPINE BESYLATE 10 MG PO TABS: 10.0000 mg | ORAL_TABLET | Freq: Every day | ORAL | Status: DC

## 2011-09-16 NOTE — Assessment & Plan Note (Signed)
Stable con't meds 

## 2011-09-16 NOTE — Patient Instructions (Addendum)
Preventive Care for Adults, Female A healthy lifestyle and preventive care can promote health and wellness. Preventive health guidelines for women include the following key practices.  A routine yearly physical is a good way to check with your caregiver about your health and preventive screening. It is a chance to share any concerns and updates on your health, and to receive a thorough exam.   Visit your dentist for a routine exam and preventive care every 6 months. Brush your teeth twice a day and floss once a day. Good oral hygiene prevents tooth decay and gum disease.   The frequency of eye exams is based on your age, health, family medical history, use of contact lenses, and other factors. Follow your caregiver's recommendations for frequency of eye exams.   Eat a healthy diet. Foods like vegetables, fruits, whole grains, low-fat dairy products, and lean protein foods contain the nutrients you need without too many calories. Decrease your intake of foods high in solid fats, added sugars, and salt. Eat the right amount of calories for you.Get information about a proper diet from your caregiver, if necessary.   Regular physical exercise is one of the most important things you can do for your health. Most adults should get at least 150 minutes of moderate-intensity exercise (any activity that increases your heart rate and causes you to sweat) each week. In addition, most adults need muscle-strengthening exercises on 2 or more days a week.   Maintain a healthy weight. The body mass index (BMI) is a screening tool to identify possible weight problems. It provides an estimate of body fat based on height and weight. Your caregiver can help determine your BMI, and can help you achieve or maintain a healthy weight.For adults 20 years and older:   A BMI below 18.5 is considered underweight.   A BMI of 18.5 to 24.9 is normal.   A BMI of 25 to 29.9 is considered overweight.   A BMI of 30 and above is  considered obese.   Maintain normal blood lipids and cholesterol levels by exercising and minimizing your intake of saturated fat. Eat a balanced diet with plenty of fruit and vegetables. Blood tests for lipids and cholesterol should begin at age 60 and be repeated every 5 years. If your lipid or cholesterol levels are high, you are over 50, or you are at high risk for heart disease, you may need your cholesterol levels checked more frequently.Ongoing high lipid and cholesterol levels should be treated with medicines if diet and exercise are not effective.   If you smoke, find out from your caregiver how to quit. If you do not use tobacco, do not start.   If you are pregnant, do not drink alcohol. If you are breastfeeding, be very cautious about drinking alcohol. If you are not pregnant and choose to drink alcohol, do not exceed 1 drink per day. One drink is considered to be 12 ounces (355 mL) of beer, 5 ounces (148 mL) of wine, or 1.5 ounces (44 mL) of liquor.   Avoid use of street drugs. Do not share needles with anyone. Ask for help if you need support or instructions about stopping the use of drugs.   High blood pressure causes heart disease and increases the risk of stroke. Your blood pressure should be checked at least every 1 to 2 years. Ongoing high blood pressure should be treated with medicines if weight loss and exercise are not effective.   If you are 55 to 73  years old, ask your caregiver if you should take aspirin to prevent strokes.   Diabetes screening involves taking a blood sample to check your fasting blood sugar level. This should be done once every 3 years, after age 49, if you are within normal weight and without risk factors for diabetes. Testing should be considered at a younger age or be carried out more frequently if you are overweight and have at least 1 risk factor for diabetes.   Breast cancer screening is essential preventive care for women. You should practice "breast  self-awareness." This means understanding the normal appearance and feel of your breasts and may include breast self-examination. Any changes detected, no matter how small, should be reported to a caregiver. Women in their 71s and 30s should have a clinical breast exam (CBE) by a caregiver as part of a regular health exam every 1 to 3 years. After age 46, women should have a CBE every year. Starting at age 30, women should consider having a mammography (breast X-ray test) every year. Women who have a family history of breast cancer should talk to their caregiver about genetic screening. Women at a high risk of breast cancer should talk to their caregivers about having magnetic resonance imaging (MRI) and a mammography every year.   The Pap test is a screening test for cervical cancer. A Pap test can show cell changes on the cervix that might become cervical cancer if left untreated. A Pap test is a procedure in which cells are obtained and examined from the lower end of the uterus (cervix).   Women should have a Pap test starting at age 42.   Between ages 45 and 66, Pap tests should be repeated every 2 years.   Beginning at age 48, you should have a Pap test every 3 years as long as the past 3 Pap tests have been normal.   Some women have medical problems that increase the chance of getting cervical cancer. Talk to your caregiver about these problems. It is especially important to talk to your caregiver if a new problem develops soon after your last Pap test. In these cases, your caregiver may recommend more frequent screening and Pap tests.   The above recommendations are the same for women who have or have not gotten the vaccine for human papillomavirus (HPV).   If you had a hysterectomy for a problem that was not cancer or a condition that could lead to cancer, then you no longer need Pap tests. Even if you no longer need a Pap test, a regular exam is a good idea to make sure no other problems are  starting.   If you are between ages 1 and 22, and you have had normal Pap tests going back 10 years, you no longer need Pap tests. Even if you no longer need a Pap test, a regular exam is a good idea to make sure no other problems are starting.   If you have had past treatment for cervical cancer or a condition that could lead to cancer, you need Pap tests and screening for cancer for at least 20 years after your treatment.   If Pap tests have been discontinued, risk factors (such as a new sexual partner) need to be reassessed to determine if screening should be resumed.   The HPV test is an additional test that may be used for cervical cancer screening. The HPV test looks for the virus that can cause the cell changes on the cervix.  The cells collected during the Pap test can be tested for HPV. The HPV test could be used to screen women aged 70 years and older, and should be used in women of any age who have unclear Pap test results. After the age of 15, women should have HPV testing at the same frequency as a Pap test.   Colorectal cancer can be detected and often prevented. Most routine colorectal cancer screening begins at the age of 66 and continues through age 65. However, your caregiver may recommend screening at an earlier age if you have risk factors for colon cancer. On a yearly basis, your caregiver may provide home test kits to check for hidden blood in the stool. Use of a small camera at the end of a tube, to directly examine the colon (sigmoidoscopy or colonoscopy), can detect the earliest forms of colorectal cancer. Talk to your caregiver about this at age 76, when routine screening begins. Direct examination of the colon should be repeated every 5 to 10 years through age 41, unless early forms of pre-cancerous polyps or small growths are found.   Hepatitis C blood testing is recommended for all people born from [birth year] through 1965 and any individual with known risks for hepatitis C.    Practice safe sex. Use condoms and avoid high-risk sexual practices to reduce the spread of sexually transmitted infections (STIs). STIs include gonorrhea, chlamydia, syphilis, trichomonas, herpes, HPV, and human immunodeficiency virus (HIV). Herpes, HIV, and HPV are viral illnesses that have no cure. They can result in disability, cancer, and death. Sexually active women aged 72 and younger should be checked for chlamydia. Older women with new or multiple partners should also be tested for chlamydia. Testing for other STIs is recommended if you are sexually active and at increased risk.   Osteoporosis is a disease in which the bones lose minerals and strength with aging. This can result in serious bone fractures. The risk of osteoporosis can be identified using a bone density scan. Women ages 15 and over and women at risk for fractures or osteoporosis should discuss screening with their caregivers. Ask your caregiver whether you should take a calcium supplement or vitamin D to reduce the rate of osteoporosis.   Menopause can be associated with physical symptoms and risks. Hormone replacement therapy is available to decrease symptoms and risks. You should talk to your caregiver about whether hormone replacement therapy is right for you.   Use sunscreen with sun protection factor (SPF) of 30 or more. Apply sunscreen liberally and repeatedly throughout the day. You should seek shade when your shadow is shorter than you. Protect yourself by wearing long sleeves, pants, a wide-brimmed hat, and sunglasses year round, whenever you are outdoors.   Once a month, do a whole body skin exam, using a mirror to look at the skin on your back. Notify your caregiver of new moles, moles that have irregular borders, moles that are larger than a pencil eraser, or moles that have changed in shape or color.   Stay current with required immunizations.   Influenza. You need a dose every fall (or winter). The composition of  the flu vaccine changes each year, so being vaccinated once is not enough.   Pneumococcal polysaccharide. You need 1 to 2 doses if you smoke cigarettes or if you have certain chronic medical conditions. You need 1 dose at age 38 (or older) if you have never been vaccinated.   Tetanus, diphtheria, pertussis (Tdap, Td). Get 1 dose of  Tdap vaccine if you are younger than age 44, are over 93 and have contact with an infant, are a Dietitian, are pregnant, or simply want to be protected from whooping cough. After that, you need a Td booster dose every 10 years. Consult your caregiver if you have not had at least 3 tetanus and diphtheria-containing shots sometime in your life or have a deep or dirty wound.   HPV. You need this vaccine if you are a woman age 62 or younger. The vaccine is given in 3 doses over 6 months.   Measles, mumps, rubella (MMR). You need at least 1 dose of MMR if you were born in 1957 or later. You may also need a second dose.   Meningococcal. If you are age 41 to 41 and a first-year college student living in a residence hall, or have one of several medical conditions, you need to get vaccinated against meningococcal disease. You may also need additional booster doses.   Zoster (shingles). If you are age 49 or older, you should get this vaccine.   Varicella (chickenpox). If you have never had chickenpox or you were vaccinated but received only 1 dose, talk to your caregiver to find out if you need this vaccine.   Hepatitis A. You need this vaccine if you have a specific risk factor for hepatitis A virus infection or you simply wish to be protected from this disease. The vaccine is usually given as 2 doses, 6 to 18 months apart.   Hepatitis B. You need this vaccine if you have a specific risk factor for hepatitis B virus infection or you simply wish to be protected from this disease. The vaccine is given in 3 doses, usually over 6 months.  Preventive Services /  Frequency Ages 79 to 53  Blood pressure check.** / Every 1 to 2 years.   Lipid and cholesterol check.** / Every 5 years beginning at age 62.   Clinical breast exam.** / Every 3 years for women in their 74s and 52s.   Pap test.** / Every 2 years from ages 36 through 35. Every 3 years starting at age 28 through age 2 or 71 with a history of 3 consecutive normal Pap tests.   HPV screening.** / Every 3 years from ages 63 through ages 36 to 79 with a history of 3 consecutive normal Pap tests.   Hepatitis C blood test.** / For any individual with known risks for hepatitis C.   Skin self-exam. / Monthly.   Influenza immunization.** / Every year.   Pneumococcal polysaccharide immunization.** / 1 to 2 doses if you smoke cigarettes or if you have certain chronic medical conditions.   Tetanus, diphtheria, pertussis (Tdap, Td) immunization. / A one-time dose of Tdap vaccine. After that, you need a Td booster dose every 10 years.   HPV immunization. / 3 doses over 6 months, if you are 15 and younger.   Measles, mumps, rubella (MMR) immunization. / You need at least 1 dose of MMR if you were born in 1957 or later. You may also need a second dose.   Meningococcal immunization. / 1 dose if you are age 84 to 25 and a first-year college student living in a residence hall, or have one of several medical conditions, you need to get vaccinated against meningococcal disease. You may also need additional booster doses.   Varicella immunization.** / Consult your caregiver.   Hepatitis A immunization.** / Consult your caregiver. 2 doses, 6 to 18 months  apart.   Hepatitis B immunization.** / Consult your caregiver. 3 doses usually over 6 months.  Ages 7 to 39  Blood pressure check.** / Every 1 to 2 years.   Lipid and cholesterol check.** / Every 5 years beginning at age 60.   Clinical breast exam.** / Every year after age 57.   Mammogram.** / Every year beginning at age 27 and continuing for as  long as you are in good health. Consult with your caregiver.   Pap test.** / Every 3 years starting at age 67 through age 22 or 70 with a history of 3 consecutive normal Pap tests.   HPV screening.** / Every 3 years from ages 66 through ages 24 to 37 with a history of 3 consecutive normal Pap tests.   Fecal occult blood test (FOBT) of stool. / Every year beginning at age 69 and continuing until age 10. You may not need to do this test if you get a colonoscopy every 10 years.   Flexible sigmoidoscopy or colonoscopy.** / Every 5 years for a flexible sigmoidoscopy or every 10 years for a colonoscopy beginning at age 48 and continuing until age 77.   Hepatitis C blood test.** / For all people born from [birth year] through 1967 and any individual with known risks for hepatitis C.   Skin self-exam. / Monthly.   Influenza immunization.** / Every year.   Pneumococcal polysaccharide immunization.** / 1 to 2 doses if you smoke cigarettes or if you have certain chronic medical conditions.   Tetanus, diphtheria, pertussis (Tdap, Td) immunization.** / A one-time dose of Tdap vaccine. After that, you need a Td booster dose every 10 years.   Measles, mumps, rubella (MMR) immunization. / You need at least 1 dose of MMR if you were born in 1957 or later. You may also need a second dose.   Varicella immunization.** / Consult your caregiver.   Meningococcal immunization.** / Consult your caregiver.   Hepatitis A immunization.** / Consult your caregiver. 2 doses, 6 to 18 months apart.   Hepatitis B immunization.** / Consult your caregiver. 3 doses, usually over 6 months.  Ages 79 and over  Blood pressure check.** / Every 1 to 2 years.   Lipid and cholesterol check.** / Every 5 years beginning at age 27.   Clinical breast exam.** / Every year after age 55.   Mammogram.** / Every year beginning at age 44 and continuing for as long as you are in good health. Consult with your caregiver.   Pap test.** /  Every 3 years starting at age 65 through age 87 or 69 with a 3 consecutive normal Pap tests. Testing can be stopped between 65 and 70 with 3 consecutive normal Pap tests and no abnormal Pap or HPV tests in the past 10 years.   HPV screening.** / Every 3 years from ages 37 through ages 41 or 85 with a history of 3 consecutive normal Pap tests. Testing can be stopped between 65 and 70 with 3 consecutive normal Pap tests and no abnormal Pap or HPV tests in the past 10 years.   Fecal occult blood test (FOBT) of stool. / Every year beginning at age 7 and continuing until age 46. You may not need to do this test if you get a colonoscopy every 10 years.   Flexible sigmoidoscopy or colonoscopy.** / Every 5 years for a flexible sigmoidoscopy or every 10 years for a colonoscopy beginning at age 74 and continuing until age 48.   Hepatitis  C blood test.** / For all people born from [birth year] through 1967 and any individual with known risks for hepatitis C.   Osteoporosis screening.** / A one-time screening for women ages 7 and over and women at risk for fractures or osteoporosis.   Skin self-exam. / Monthly.   Influenza immunization.** / Every year.   Pneumococcal polysaccharide immunization.** / 1 dose at age 88 (or older) if you have never been vaccinated.   Tetanus, diphtheria, pertussis (Tdap, Td) immunization. / A one-time dose of Tdap vaccine if you are over 65 and have contact with an infant, are a Dietitian, or simply want to be protected from whooping cough. After that, you need a Td booster dose every 10 years.   Varicella immunization.** / Consult your caregiver.   Meningococcal immunization.** / Consult your caregiver.   Hepatitis A immunization.** / Consult your caregiver. 2 doses, 6 to 18 months apart.   Hepatitis B immunization.** / Check with your caregiver. 3 doses, usually over 6 months.  ** Family history and personal history of risk and conditions may change your caregiver's  recommendations. Document Released: 05/17/2001 Document Revised: 03/10/2011 Document Reviewed: 08/16/2010 [facility] Patient Information 2012 [place], [place].

## 2011-09-16 NOTE — Progress Notes (Signed)
Subjective:    Morgan Morrow is a 71 y.o. female who presents for Medicare Annual/Subsequent preventive examination.  Preventive Screening-Counseling & Management  Tobacco History  Smoking status  . Never Smoker   Smokeless tobacco  . Not on file     Problems Prior to Visit 1.   Current Problems (verified) Patient Active Problem List  Diagnosis  . HYPERTENSION  . ALLERGIC RHINITIS  . HEMOCCULT POSITIVE STOOL  . HYPERGLYCEMIA  . PAP SMEAR, ABNORMAL  . Cough    Medications Prior to Visit Current Outpatient Prescriptions on File Prior to Visit  Medication Sig Dispense Refill  . ibuprofen (ADVIL,MOTRIN) 200 MG tablet Take 200 mg by mouth every 6 (six) hours as needed.        Marland Kitchen DISCONTD: amLODipine (NORVASC) 10 MG tablet Take 1 tablet (10 mg total) by mouth daily.  30 tablet  1  . DISCONTD: lisinopril (PRINIVIL,ZESTRIL) 20 MG tablet Take 1 tablet (20 mg total) by mouth daily.  30 tablet  1    Current Medications (verified) Current Outpatient Prescriptions  Medication Sig Dispense Refill  . amLODipine (NORVASC) 10 MG tablet Take 1 tablet (10 mg total) by mouth daily.  30 tablet  11  . ibuprofen (ADVIL,MOTRIN) 200 MG tablet Take 200 mg by mouth every 6 (six) hours as needed.        Marland Kitchen lisinopril (PRINIVIL,ZESTRIL) 20 MG tablet Take 1 tablet (20 mg total) by mouth daily.  90 tablet  3  . DISCONTD: amLODipine (NORVASC) 10 MG tablet Take 1 tablet (10 mg total) by mouth daily.  30 tablet  1  . DISCONTD: lisinopril (PRINIVIL,ZESTRIL) 20 MG tablet Take 1 tablet (20 mg total) by mouth daily.  30 tablet  1     Allergies (verified) Review of patient's allergies indicates no known allergies.   PAST HISTORY  Family History Family History  Problem Relation Age of Onset  . Hypertension Mother   . Depression Mother   . Hypertension Father   . Arthritis Father     Social History History  Substance Use Topics  . Smoking status: Never Smoker   . Smokeless tobacco: Not on file    . Alcohol Use: No     Are there smokers in your home (other than you)? No  Risk Factors Current exercise habits: walks daily  Dietary issues discussed: na   Cardiac risk factors: advanced age (older than 59 for men, 37 for women), dyslipidemia and hypertension.  Depression Screen (Note: if answer to either of the following is "Yes", a more complete depression screening is indicated)   Over the past two weeks, have you felt down, depressed or hopeless? No  Over the past two weeks, have you felt little interest or pleasure in doing things? No  Have you lost interest or pleasure in daily life? No  Do you often feel hopeless? No  Do you cry easily over simple problems? No  Activities of Daily Living In your present state of health, do you have any difficulty performing the following activities?:  Driving? No Managing money?  No Feeding yourself? No Getting from bed to chair? No Climbing a flight of stairs? No Preparing food and eating?: No Bathing or showering? No Getting dressed: No Getting to the toilet? No Using the toilet:No Moving around from place to place: No In the past year have you fallen or had a near fall?:No   Are you sexually active?  No  Do you have more than one partner?  No  Hearing Difficulties: No Do you often ask people to speak up or repeat themselves? No Do you experience ringing or noises in your ears? No Do you have difficulty understanding soft or whispered voices? No   Do you feel that you have a problem with memory? No  Do you often misplace items? No  Do you feel safe at home?  No  Cognitive Testing  Alert? Yes  Normal Appearance?Yes  Oriented to person? Yes  Place? Yes   Time? Yes  Recall of three objects?  Yes  Can perform simple calculations? Yes  Displays appropriate judgment?Yes  Can read the correct time from a watch face?Yes   Advanced Directives have been discussed with the patient? Yes  List the Names of Other  Physician/Practitioners you currently use: 1.oph--pearl 2 dentist-- no Indicate any recent Medical Services you may have received from other than [facility] providers in the past year (date may be approximate).   There is no immunization history on file for this patient.  Screening Tests Health Maintenance  Topic Date Due  . Tetanus/tdap  09/06/1957  . Zostavax  09/07/1998  . Pneumococcal Polysaccharide Vaccine Age 22 And Over  09/07/2003  . Mammogram  10/24/2010  . Influenza Vaccine  01/03/2012  . Colonoscopy  10/24/2019    All answers were reviewed with the patient and necessary referrals were made:  [name], DO   09/16/2011   History reviewed: allergies, current medications, past family history, past medical history, past social history, past surgical history and problem list  Review of Systems  Review of Systems  Constitutional: Negative for activity change, appetite change and fatigue.  HENT: Negative for hearing loss, congestion, tinnitus and ear discharge.   Eyes: Negative for visual disturbance (see optho q1y -- vision corrected to 20/20 with glasses).  Respiratory: Negative for cough, chest tightness and shortness of breath.   Cardiovascular: Negative for chest pain, palpitations and leg swelling.  Gastrointestinal: Negative for abdominal pain, diarrhea, constipation and abdominal distention.  Genitourinary: Negative for urgency, frequency, decreased urine volume and difficulty urinating.  Musculoskeletal: Negative for back pain, arthralgias and gait problem.  Skin: Negative for color change, pallor and rash.  Neurological: Negative for dizziness, light-headedness, numbness and headaches.  Hematological: Negative for adenopathy. Does not bruise/bleed easily.  Psychiatric/Behavioral: Negative for suicidal ideas, confusion, sleep disturbance, self-injury, dysphoric mood, decreased concentration and agitation.  Pt is able to read and write and can do all ADLs No risk for  falling No abuse/ violence in home     Objective:     Vision by Snellen chart: right eye:20/50, left eye:20/50  With glasses  Body mass index is 22.64 kg/(m^2). BP 134/72  Pulse 75  Temp 97.3 F (36.3 C) (Oral)  Ht 5' 1.5" (1.562 m)  Wt 121 lb 12.8 oz (55.248 kg)  BMI 22.64 kg/m2  SpO2 97%  BP 134/72  Pulse 75  Temp 97.3 F (36.3 C) (Oral)  Ht 5' 1.5" (1.562 m)  Wt 121 lb 12.8 oz (55.248 kg)  BMI 22.64 kg/m2  SpO2 97% General appearance: alert, cooperative, appears stated age and no distress Head: Normocephalic, without obvious abnormality, atraumatic Eyes: conjunctivae/corneas clear. PERRL, EOM's intact. Fundi benign. Ears: normal TM's and external ear canals both ears Nose: Nares normal. Septum midline. Mucosa normal. No drainage or sinus tenderness. Throat: lips, mucosa, and tongue normal; teeth and gums normal Neck: no adenopathy, no carotid bruit, no JVD, supple, symmetrical, trachea midline and thyroid not enlarged, symmetric, no tenderness/mass/nodules Back: no skin  lesions, erythema, or scars, no tenderness to percussion or palpation Lungs: clear to auscultation bilaterally Breasts: Inspection negative, No nipple retraction or dimpling, No nipple discharge or bleeding, No axillary or supraclavicular adenopathy, dense breasts Heart: S1, S2 normal,  + murmur Abdomen: soft, non-tender; bowel sounds normal; no masses,  no organomegaly Pelvic: not indicated; post-menopausal, no abnormal Pap smears in past and pt refused Extremities: extremities normal, atraumatic, no cyanosis or edema Pulses: 2+ and symmetric Skin: Skin color, texture, turgor normal. No rashes or lesions Lymph nodes: Cervical, supraclavicular, and axillary nodes normal. Neurologic: Alert and oriented X 3, normal strength and tone. Normal symmetric reflexes. Normal coordination and gait Psych-- no depression, no anxiety     Assessment:     cpe      Plan:     During the course of the visit  the patient was educated and counseled about appropriate screening and preventive services including:  All vaccines and preventative tests were refused.  Pt states " I don't think anyone at my age needs those things. "  Pneumococcal vaccine   Screening mammography  Screening Pap smear and pelvic exam   Advanced directives: has NO advanced directive - not interested in additional information  Diet review for nutrition referral? Yes ____  Not Indicated __x__   Patient Instructions (the written plan) was given to the patient.  Medicare Attestation I have personally reviewed: The patient's medical and social history Their use of alcohol, tobacco or illicit drugs Their current medications and supplements The patient's functional ability including ADLs,fall risks, home safety risks, cognitive, and hearing and visual impairment Diet and physical activities Evidence for depression or mood disorders  The patient's weight, height, BMI, and visual acuity have been recorded in the chart.  I have made referrals, counseling, and provided education to the patient based on review of the above and I have provided the patient with a written personalized care plan for preventive services.     [name], DO   09/16/2011

## 2012-09-04 ENCOUNTER — Other Ambulatory Visit: Payer: Self-pay | Admitting: Family Medicine

## 2012-10-04 ENCOUNTER — Other Ambulatory Visit: Payer: Self-pay | Admitting: Family Medicine

## 2012-10-18 ENCOUNTER — Encounter: Payer: Self-pay | Admitting: Family Medicine

## 2012-10-18 ENCOUNTER — Ambulatory Visit (INDEPENDENT_AMBULATORY_CARE_PROVIDER_SITE_OTHER): Payer: Medicare Other | Admitting: Family Medicine

## 2012-10-18 VITALS — BP 122/70 | HR 74 | Temp 98.0°F | Ht 62.0 in | Wt 120.4 lb

## 2012-10-18 DIAGNOSIS — Z0000 Encounter for general adult medical examination without abnormal findings: Secondary | ICD-10-CM

## 2012-10-18 DIAGNOSIS — I10 Essential (primary) hypertension: Secondary | ICD-10-CM

## 2012-10-18 LAB — HEPATIC FUNCTION PANEL
Albumin: 4 g/dL (ref 3.5–5.2)
Total Protein: 7 g/dL (ref 6.0–8.3)

## 2012-10-18 LAB — BASIC METABOLIC PANEL
Creatinine, Ser: 1.1 mg/dL (ref 0.4–1.2)
Potassium: 4.1 mEq/L (ref 3.5–5.1)

## 2012-10-18 LAB — CBC WITH DIFFERENTIAL/PLATELET
Basophils Relative: 1 % (ref 0.0–3.0)
Eosinophils Relative: 1.2 % (ref 0.0–5.0)
Hemoglobin: 13.1 g/dL (ref 12.0–15.0)
Lymphocytes Relative: 17.5 % (ref 12.0–46.0)
Lymphs Abs: 1.3 10*3/uL (ref 0.7–4.0)
MCHC: 33.3 g/dL (ref 30.0–36.0)
MCV: 89.9 fl (ref 78.0–100.0)
Neutro Abs: 5.3 10*3/uL (ref 1.4–7.7)
Neutrophils Relative %: 74.2 % (ref 43.0–77.0)
RBC: 4.39 Mil/uL (ref 3.87–5.11)
RDW: 13.2 % (ref 11.5–14.6)
WBC: 7.2 10*3/uL (ref 4.5–10.5)

## 2012-10-18 LAB — LIPID PANEL
Cholesterol: 153 mg/dL (ref 0–200)
HDL: 56.4 mg/dL (ref >39.00)
Triglycerides: 83 mg/dL (ref 0.0–149.0)
VLDL: 16.6 mg/dL (ref 0.0–40.0)

## 2012-10-18 MED ORDER — LISINOPRIL 20 MG PO TABS: ORAL_TABLET | ORAL | Status: DC

## 2012-10-18 MED ORDER — AMLODIPINE BESYLATE 10 MG PO TABS: ORAL_TABLET | ORAL | Status: DC

## 2012-10-18 MED ORDER — LORATADINE 10 MG PO TABS: 10.0000 mg | ORAL_TABLET | Freq: Every day | ORAL | Status: DC

## 2012-10-18 MED ORDER — DOXYCYCLINE HYCLATE 100 MG PO TABS: 100.0000 mg | ORAL_TABLET | Freq: Two times a day (BID) | ORAL | Status: DC

## 2012-10-18 NOTE — Progress Notes (Signed)
Subjective:    Morgan Morrow is a 72 y.o. female who presents for Medicare Annual/Subsequent preventive examination.  Preventive Screening-Counseling & Management  Tobacco History  Smoking status  . Never Smoker   Smokeless tobacco  . Not on file     Problems Prior to Visit 1.   Current Problems (verified) Patient Active Problem List   Diagnosis Date Noted  . Cough 11/01/2010  . HYPERGLYCEMIA 10/23/2009  . PAP SMEAR, ABNORMAL 02/10/2007  . HEMOCCULT POSITIVE STOOL 02/06/2007  . HYPERTENSION 10/23/2006  . ALLERGIC RHINITIS 10/23/2006    Medications Prior to Visit Current Outpatient Prescriptions on File Prior to Visit  Medication Sig Dispense Refill  . ibuprofen (ADVIL,MOTRIN) 200 MG tablet Take 200 mg by mouth every 6 (six) hours as needed.         No current facility-administered medications on file prior to visit.    Current Medications (verified) Current Outpatient Prescriptions  Medication Sig Dispense Refill  . amLODipine (NORVASC) 10 MG tablet TAKE ONE TABLET BY MOUTH ONE TIME DAILY  90 tablet  3  . ibuprofen (ADVIL,MOTRIN) 200 MG tablet Take 200 mg by mouth every 6 (six) hours as needed.        Marland Kitchen lisinopril (PRINIVIL,ZESTRIL) 20 MG tablet TAKE ONE TABLET BY MOUTH ONE TIME DAILY  90 tablet  3   No current facility-administered medications for this visit.     Allergies (verified) Review of patient's allergies indicates no known allergies.   PAST HISTORY  Family History Family History  Problem Relation Age of Onset  . Hypertension Mother   . Depression Mother   . Hypertension Father   . Arthritis Father     Social History History  Substance Use Topics  . Smoking status: Never Smoker   . Smokeless tobacco: Not on file  . Alcohol Use: No     Are there smokers in your home (other than you)? No  Risk Factors Current exercise habits: walk   Dietary issues discussed: na   Cardiac risk factors: advanced age (older than 16 for men, 48 for women)  and hypertension.  Depression Screen (Note: if answer to either of the following is "Yes", a more complete depression screening is indicated)   Over the past two weeks, have you felt down, depressed or hopeless? No  Over the past two weeks, have you felt little interest or pleasure in doing things? No  Have you lost interest or pleasure in daily life? No  Do you often feel hopeless? No  Do you cry easily over simple problems? No  Activities of Daily Living In your present state of health, do you have any difficulty performing the following activities?:  Driving? No Managing money?  No Feeding yourself? No Getting from bed to chair? No Climbing a flight of stairs? No Preparing food and eating?: No Bathing or showering? No Getting dressed: No Getting to the toilet? No Using the toilet:No Moving around from place to place: No In the past year have you fallen or had a near fall?:No   Are you sexually active?  No  Do you have more than one partner?  No  Hearing Difficulties: No Do you often ask people to speak up or repeat themselves? No Do you experience ringing or noises in your ears? No Do you have difficulty understanding soft or whispered voices? No   Do you feel that you have a problem with memory? No  Do you often misplace items? No  Do you feel safe at home?  No  Cognitive Testing  Alert? Yes  Normal Appearance?Yes  Oriented to person? Yes  Place? Yes   Time? Yes  Recall of three objects?  Yes  Can perform simple calculations? Yes  Displays appropriate judgment?Yes  Can read the correct time from a watch face?Yes   Advanced Directives have been discussed with the patient? Yes  List the Names of Other Physician/Practitioners you currently use: 1.    Indicate any recent Medical Services you may have received from other than [facility] providers in the past year (date may be approximate).   There is no immunization history on file for this patient.  Screening  Tests Health Maintenance  Topic Date Due  . Tetanus/tdap  09/06/1957  . Mammogram  10/24/2010  . Influenza Vaccine  12/03/2012  . Colonoscopy  10/24/2019  . Pneumococcal Polysaccharide Vaccine Age 30 And Over  Addressed  . Zostavax  Addressed    All answers were reviewed with the patient and necessary referrals were made:  [name], DO   10/18/2012   History reviewed:  She  has a past medical history of Allergy and Hypertension. She  does not have any pertinent problems on file. She  has no past surgical history on file. Her family history includes Arthritis in her father; Depression in her mother; and Hypertension in her father and mother. She  reports that she has never smoked. She does not have any smokeless tobacco history on file. She reports that she does not drink alcohol or use illicit drugs. She has a current medication list which includes the following prescription(s): amlodipine, ibuprofen, and lisinopril. Current Outpatient Prescriptions on File Prior to Visit  Medication Sig Dispense Refill  . ibuprofen (ADVIL,MOTRIN) 200 MG tablet Take 200 mg by mouth every 6 (six) hours as needed.         No current facility-administered medications on file prior to visit.   She has No Known Allergies.  Review of Systems  Review of Systems  Constitutional: Negative for activity change, appetite change and fatigue.  HENT: Negative for hearing loss, congestion, tinnitus and ear discharge.   Eyes: Negative for visual disturbance (see optho q1y -- vision corrected to 20/20 with glasses).  Respiratory: Negative for cough, chest tightness and shortness of breath.   Cardiovascular: Negative for chest pain, palpitations and leg swelling.  Gastrointestinal: Negative for abdominal pain, diarrhea, constipation and abdominal distention.  Genitourinary: Negative for urgency, frequency, decreased urine volume and difficulty urinating.  Musculoskeletal: Negative for back pain, arthralgias  and gait problem.  Skin: Negative for color change, pallor and rash.  Neurological: Negative for dizziness, light-headedness, numbness and headaches.  Hematological: Negative for adenopathy. Does not bruise/bleed easily.  Psychiatric/Behavioral: Negative for suicidal ideas, confusion, sleep disturbance, self-injury, dysphoric mood, decreased concentration and agitation.  Pt is able to read and write and can do all ADLs No risk for falling No abuse/ violence in home     Objective:     Vision by Snellen chart: oph Body mass index is 22.02 kg/(m^2). BP 122/70  Pulse 74  Temp(Src) 98 F (36.7 C) (Oral)  Ht 5' 2"  (1.575 m)  Wt 120 lb 6.4 oz (54.613 kg)  BMI 22.02 kg/m2  SpO2 95%  BP 122/70  Pulse 74  Temp(Src) 98 F (36.7 C) (Oral)  Ht 5' 2"  (1.575 m)  Wt 120 lb 6.4 oz (54.613 kg)  BMI 22.02 kg/m2  SpO2 95% General appearance: alert, cooperative, appears stated age and no distress Head: Normocephalic, without obvious abnormality,  atraumatic Eyes: conjunctivae/corneas clear. PERRL, EOM's intact. Fundi benign. Ears: normal TM's and external ear canals both ears Nose: Nares normal. Septum midline. Mucosa normal. No drainage or sinus tenderness. Throat: lips, mucosa, and tongue normal; teeth and gums normal Neck: no adenopathy, no carotid bruit, no JVD, supple, symmetrical, trachea midline and thyroid not enlarged, symmetric, no tenderness/mass/nodules Back: symmetric, no curvature. ROM normal. No CVA tenderness. Lungs: clear to auscultation bilaterally Breasts: normal appearance, no masses or tenderness, positive findings: skin dimpling on the right upper outer quadrant Heart: regular rate and rhythm, S1, S2 normal, no murmur, click, rub or gallop Abdomen: soft, non-tender; bowel sounds normal; no masses,  no organomegaly Pelvic: deferred-  Pt preference Extremities: extremities normal, atraumatic, no cyanosis or edema Pulses: 2+ and symmetric Skin: Skin color, texture, turgor  normal. No rashes or lesions Lymph nodes: Cervical, supraclavicular, and axillary nodes normal. Neurologic: Alert and oriented X 3, normal strength and tone. Normal symmetric reflexes. Normal coordination and gait Psych--no anxiety, no depression      Assessment:     cPE     Plan:     During the course of the visit the patient was educated and counseled about appropriate screening and preventive services including:    pt declined all preventative care-- no mammogram--pt knew I suspected a problem but refused mammogram.  Diet review for nutrition referral? Yes ____  Not Indicated ___x_   Patient Instructions (the written plan) was given to the patient.  Medicare Attestation I have personally reviewed: The patient's medical and social history Their use of alcohol, tobacco or illicit drugs Their current medications and supplements The patient's functional ability including ADLs,fall risks, home safety risks, cognitive, and hearing and visual impairment Diet and physical activities Evidence for depression or mood disorders  The patient's weight, height, BMI, and visual acuity have been recorded in the chart.  I have made referrals, counseling, and provided education to the patient based on review of the above and I have provided the patient with a written personalized care plan for preventive services.     [name], DO   10/18/2012

## 2012-10-18 NOTE — Patient Instructions (Addendum)
Preventive Care for Adults, Female A healthy lifestyle and preventive care can promote health and wellness. Preventive health guidelines for women include the following key practices.  A routine yearly physical is a good way to check with your caregiver about your health and preventive screening. It is a chance to share any concerns and updates on your health, and to receive a thorough exam.  Visit your dentist for a routine exam and preventive care every 6 months. Brush your teeth twice a day and floss once a day. Good oral hygiene prevents tooth decay and gum disease.  The frequency of eye exams is based on your age, health, family medical history, use of contact lenses, and other factors. Follow your caregiver's recommendations for frequency of eye exams.  Eat a healthy diet. Foods like vegetables, fruits, whole grains, low-fat dairy products, and lean protein foods contain the nutrients you need without too many calories. Decrease your intake of foods high in solid fats, added sugars, and salt. Eat the right amount of calories for you.Get information about a proper diet from your caregiver, if necessary.  Regular physical exercise is one of the most important things you can do for your health. Most adults should get at least 150 minutes of moderate-intensity exercise (any activity that increases your heart rate and causes you to sweat) each week. In addition, most adults need muscle-strengthening exercises on 2 or more days a week.  Maintain a healthy weight. The body mass index (BMI) is a screening tool to identify possible weight problems. It provides an estimate of body fat based on height and weight. Your caregiver can help determine your BMI, and can help you achieve or maintain a healthy weight.For adults 20 years and older:  A BMI below 18.5 is considered underweight.  A BMI of 18.5 to 24.9 is normal.  A BMI of 25 to 29.9 is considered overweight.  A BMI of 30 and above is  considered obese.  Maintain normal blood lipids and cholesterol levels by exercising and minimizing your intake of saturated fat. Eat a balanced diet with plenty of fruit and vegetables. Blood tests for lipids and cholesterol should begin at age 91 and be repeated every 5 years. If your lipid or cholesterol levels are high, you are over 50, or you are at high risk for heart disease, you may need your cholesterol levels checked more frequently.Ongoing high lipid and cholesterol levels should be treated with medicines if diet and exercise are not effective.  If you smoke, find out from your caregiver how to quit. If you do not use tobacco, do not start.  If you are pregnant, do not drink alcohol. If you are breastfeeding, be very cautious about drinking alcohol. If you are not pregnant and choose to drink alcohol, do not exceed 1 drink per day. One drink is considered to be 12 ounces (355 mL) of beer, 5 ounces (148 mL) of wine, or 1.5 ounces (44 mL) of liquor.  Avoid use of street drugs. Do not share needles with anyone. Ask for help if you need support or instructions about stopping the use of drugs.  High blood pressure causes heart disease and increases the risk of stroke. Your blood pressure should be checked at least every 1 to 2 years. Ongoing high blood pressure should be treated with medicines if weight loss and exercise are not effective.  If you are 68 to 74 years old, ask your caregiver if you should take aspirin to prevent strokes.  Diabetes  screening involves taking a blood sample to check your fasting blood sugar level. This should be done once every 3 years, after age 18, if you are within normal weight and without risk factors for diabetes. Testing should be considered at a younger age or be carried out more frequently if you are overweight and have at least 1 risk factor for diabetes.  Breast cancer screening is essential preventive care for women. You should practice "breast  self-awareness." This means understanding the normal appearance and feel of your breasts and may include breast self-examination. Any changes detected, no matter how small, should be reported to a caregiver. Women in their 8s and 30s should have a clinical breast exam (CBE) by a caregiver as part of a regular health exam every 1 to 3 years. After age 24, women should have a CBE every year. Starting at age 42, women should consider having a mammography (breast X-ray test) every year. Women who have a family history of breast cancer should talk to their caregiver about genetic screening. Women at a high risk of breast cancer should talk to their caregivers about having magnetic resonance imaging (MRI) and a mammography every year.  The Pap test is a screening test for cervical cancer. A Pap test can show cell changes on the cervix that might become cervical cancer if left untreated. A Pap test is a procedure in which cells are obtained and examined from the lower end of the uterus (cervix).  Women should have a Pap test starting at age 74.  Between ages 39 and 78, Pap tests should be repeated every 2 years.  Beginning at age 59, you should have a Pap test every 3 years as long as the past 3 Pap tests have been normal.  Some women have medical problems that increase the chance of getting cervical cancer. Talk to your caregiver about these problems. It is especially important to talk to your caregiver if a new problem develops soon after your last Pap test. In these cases, your caregiver may recommend more frequent screening and Pap tests.  The above recommendations are the same for women who have or have not gotten the vaccine for human papillomavirus (HPV).  If you had a hysterectomy for a problem that was not cancer or a condition that could lead to cancer, then you no longer need Pap tests. Even if you no longer need a Pap test, a regular exam is a good idea to make sure no other problems are  starting.  If you are between ages 36 and 63, and you have had normal Pap tests going back 10 years, you no longer need Pap tests. Even if you no longer need a Pap test, a regular exam is a good idea to make sure no other problems are starting.  If you have had past treatment for cervical cancer or a condition that could lead to cancer, you need Pap tests and screening for cancer for at least 20 years after your treatment.  If Pap tests have been discontinued, risk factors (such as a new sexual partner) need to be reassessed to determine if screening should be resumed.  The HPV test is an additional test that may be used for cervical cancer screening. The HPV test looks for the virus that can cause the cell changes on the cervix. The cells collected during the Pap test can be tested for HPV. The HPV test could be used to screen women aged 23 years and older, and should  be used in women of any age who have unclear Pap test results. After the age of 19, women should have HPV testing at the same frequency as a Pap test.  Colorectal cancer can be detected and often prevented. Most routine colorectal cancer screening begins at the age of 51 and continues through age 70. However, your caregiver may recommend screening at an earlier age if you have risk factors for colon cancer. On a yearly basis, your caregiver may provide home test kits to check for hidden blood in the stool. Use of a small camera at the end of a tube, to directly examine the colon (sigmoidoscopy or colonoscopy), can detect the earliest forms of colorectal cancer. Talk to your caregiver about this at age 26, when routine screening begins. Direct examination of the colon should be repeated every 5 to 10 years through age 31, unless early forms of pre-cancerous polyps or small growths are found.  Hepatitis C blood testing is recommended for all people born from [birth year] through 1965 and any individual with known risks for hepatitis C.  Practice  safe sex. Use condoms and avoid high-risk sexual practices to reduce the spread of sexually transmitted infections (STIs). STIs include gonorrhea, chlamydia, syphilis, trichomonas, herpes, HPV, and human immunodeficiency virus (HIV). Herpes, HIV, and HPV are viral illnesses that have no cure. They can result in disability, cancer, and death. Sexually active women aged 96 and younger should be checked for chlamydia. Older women with new or multiple partners should also be tested for chlamydia. Testing for other STIs is recommended if you are sexually active and at increased risk.  Osteoporosis is a disease in which the bones lose minerals and strength with aging. This can result in serious bone fractures. The risk of osteoporosis can be identified using a bone density scan. Women ages 45 and over and women at risk for fractures or osteoporosis should discuss screening with their caregivers. Ask your caregiver whether you should take a calcium supplement or vitamin D to reduce the rate of osteoporosis.  Menopause can be associated with physical symptoms and risks. Hormone replacement therapy is available to decrease symptoms and risks. You should talk to your caregiver about whether hormone replacement therapy is right for you.  Use sunscreen with sun protection factor (SPF) of 30 or more. Apply sunscreen liberally and repeatedly throughout the day. You should seek shade when your shadow is shorter than you. Protect yourself by wearing long sleeves, pants, a wide-brimmed hat, and sunglasses year round, whenever you are outdoors.  Once a month, do a whole body skin exam, using a mirror to look at the skin on your back. Notify your caregiver of new moles, moles that have irregular borders, moles that are larger than a pencil eraser, or moles that have changed in shape or color.  Stay current with required immunizations.  Influenza. You need a dose every fall (or winter). The composition of the flu vaccine  changes each year, so being vaccinated once is not enough.  Pneumococcal polysaccharide. You need 1 to 2 doses if you smoke cigarettes or if you have certain chronic medical conditions. You need 1 dose at age 61 (or older) if you have never been vaccinated.  Tetanus, diphtheria, pertussis (Tdap, Td). Get 1 dose of Tdap vaccine if you are younger than age 74, are over 1 and have contact with an infant, are a Dietitian, are pregnant, or simply want to be protected from whooping cough. After that, you need a Td  booster dose every 10 years. Consult your caregiver if you have not had at least 3 tetanus and diphtheria-containing shots sometime in your life or have a deep or dirty wound.  HPV. You need this vaccine if you are a woman age 96 or younger. The vaccine is given in 3 doses over 6 months.  Measles, mumps, rubella (MMR). You need at least 1 dose of MMR if you were born in 1957 or later. You may also need a second dose.  Meningococcal. If you are age 80 to 42 and a first-year college student living in a residence hall, or have one of several medical conditions, you need to get vaccinated against meningococcal disease. You may also need additional booster doses.  Zoster (shingles). If you are age 43 or older, you should get this vaccine.  Varicella (chickenpox). If you have never had chickenpox or you were vaccinated but received only 1 dose, talk to your caregiver to find out if you need this vaccine.  Hepatitis A. You need this vaccine if you have a specific risk factor for hepatitis A virus infection or you simply wish to be protected from this disease. The vaccine is usually given as 2 doses, 6 to 18 months apart.  Hepatitis B. You need this vaccine if you have a specific risk factor for hepatitis B virus infection or you simply wish to be protected from this disease. The vaccine is given in 3 doses, usually over 6 months. Preventive Services / Frequency Ages 70 to 64  Blood  pressure check.** / Every 1 to 2 years.  Lipid and cholesterol check.** / Every 5 years beginning at age 80.  Clinical breast exam.** / Every 3 years for women in their 81s and 49s.  Pap test.** / Every 2 years from ages 87 through 45. Every 3 years starting at age 51 through age 70 or 81 with a history of 3 consecutive normal Pap tests.  HPV screening.** / Every 3 years from ages 47 through ages 48 to 76 with a history of 3 consecutive normal Pap tests.  Hepatitis C blood test.** / For any individual with known risks for hepatitis C.  Skin self-exam. / Monthly.  Influenza immunization.** / Every year.  Pneumococcal polysaccharide immunization.** / 1 to 2 doses if you smoke cigarettes or if you have certain chronic medical conditions.  Tetanus, diphtheria, pertussis (Tdap, Td) immunization. / A one-time dose of Tdap vaccine. After that, you need a Td booster dose every 10 years.  HPV immunization. / 3 doses over 6 months, if you are 26 and younger.  Measles, mumps, rubella (MMR) immunization. / You need at least 1 dose of MMR if you were born in 1957 or later. You may also need a second dose.  Meningococcal immunization. / 1 dose if you are age 11 to 56 and a first-year college student living in a residence hall, or have one of several medical conditions, you need to get vaccinated against meningococcal disease. You may also need additional booster doses.  Varicella immunization.** / Consult your caregiver.  Hepatitis A immunization.** / Consult your caregiver. 2 doses, 6 to 18 months apart.  Hepatitis B immunization.** / Consult your caregiver. 3 doses usually over 6 months. Ages 25 to 4  Blood pressure check.** / Every 1 to 2 years.  Lipid and cholesterol check.** / Every 5 years beginning at age 4.  Clinical breast exam.** / Every year after age 82.  Mammogram.** / Every year beginning at age 74  and continuing for as long as you are in good health. Consult with your  caregiver.  Pap test.** / Every 3 years starting at age 75 through age 77 or 55 with a history of 3 consecutive normal Pap tests.  HPV screening.** / Every 3 years from ages 42 through ages 45 to 63 with a history of 3 consecutive normal Pap tests.  Fecal occult blood test (FOBT) of stool. / Every year beginning at age 80 and continuing until age 78. You may not need to do this test if you get a colonoscopy every 10 years.  Flexible sigmoidoscopy or colonoscopy.** / Every 5 years for a flexible sigmoidoscopy or every 10 years for a colonoscopy beginning at age 47 and continuing until age 6.  Hepatitis C blood test.** / For all people born from [birth year] through 1967 and any individual with known risks for hepatitis C.  Skin self-exam. / Monthly.  Influenza immunization.** / Every year.  Pneumococcal polysaccharide immunization.** / 1 to 2 doses if you smoke cigarettes or if you have certain chronic medical conditions.  Tetanus, diphtheria, pertussis (Tdap, Td) immunization.** / A one-time dose of Tdap vaccine. After that, you need a Td booster dose every 10 years.  Measles, mumps, rubella (MMR) immunization. / You need at least 1 dose of MMR if you were born in 1957 or later. You may also need a second dose.  Varicella immunization.** / Consult your caregiver.  Meningococcal immunization.** / Consult your caregiver.  Hepatitis A immunization.** / Consult your caregiver. 2 doses, 6 to 18 months apart.  Hepatitis B immunization.** / Consult your caregiver. 3 doses, usually over 6 months. Ages 37 and over  Blood pressure check.** / Every 1 to 2 years.  Lipid and cholesterol check.** / Every 5 years beginning at age 40.  Clinical breast exam.** / Every year after age 50.  Mammogram.** / Every year beginning at age 40 and continuing for as long as you are in good health. Consult with your caregiver.  Pap test.** / Every 3 years starting at age 19 through age 40 or 65 with a 3  consecutive normal Pap tests. Testing can be stopped between 65 and 70 with 3 consecutive normal Pap tests and no abnormal Pap or HPV tests in the past 10 years.  HPV screening.** / Every 3 years from ages 5 through ages 67 or 37 with a history of 3 consecutive normal Pap tests. Testing can be stopped between 65 and 70 with 3 consecutive normal Pap tests and no abnormal Pap or HPV tests in the past 10 years.  Fecal occult blood test (FOBT) of stool. / Every year beginning at age 26 and continuing until age 69. You may not need to do this test if you get a colonoscopy every 10 years.  Flexible sigmoidoscopy or colonoscopy.** / Every 5 years for a flexible sigmoidoscopy or every 10 years for a colonoscopy beginning at age 45 and continuing until age 89.  Hepatitis C blood test.** / For all people born from [birth year] through 1967 and any individual with known risks for hepatitis C.  Osteoporosis screening.** / A one-time screening for women ages 57 and over and women at risk for fractures or osteoporosis.  Skin self-exam. / Monthly.  Influenza immunization.** / Every year.  Pneumococcal polysaccharide immunization.** / 1 dose at age 31 (or older) if you have never been vaccinated.  Tetanus, diphtheria, pertussis (Tdap, Td) immunization. / A one-time dose of Tdap vaccine if you are over  53 and have contact with an infant, are a Dietitian, or simply want to be protected from whooping cough. After that, you need a Td booster dose every 10 years.  Varicella immunization.** / Consult your caregiver.  Meningococcal immunization.** / Consult your caregiver.  Hepatitis A immunization.** / Consult your caregiver. 2 doses, 6 to 18 months apart.  Hepatitis B immunization.** / Check with your caregiver. 3 doses, usually over 6 months. ** Family history and personal history of risk and conditions may change your caregiver's recommendations. Document Released: 05/17/2001 Document Revised: 06/13/2011  Document Reviewed: 08/16/2010 [facility] Patient Information 2014 [place], [place].

## 2012-10-22 LAB — POCT URINALYSIS DIPSTICK
Bilirubin, UA: NEGATIVE
Ketones, UA: NEGATIVE
Leukocytes, UA: NEGATIVE
Protein, UA: NEGATIVE
Spec Grav, UA: >=1.03
pH, UA: 5

## 2013-11-06 ENCOUNTER — Other Ambulatory Visit: Payer: Self-pay | Admitting: Family Medicine

## 2013-11-11 ENCOUNTER — Ambulatory Visit (INDEPENDENT_AMBULATORY_CARE_PROVIDER_SITE_OTHER): Payer: Medicare Other | Admitting: Family Medicine

## 2013-11-11 ENCOUNTER — Encounter: Payer: Self-pay | Admitting: Family Medicine

## 2013-11-11 VITALS — BP 118/70 | HR 84 | Resp 16 | Wt 130.2 lb

## 2013-11-11 DIAGNOSIS — I10 Essential (primary) hypertension: Secondary | ICD-10-CM

## 2013-11-11 MED ORDER — AMLODIPINE BESYLATE 10 MG PO TABS: ORAL_TABLET | ORAL | Status: DC

## 2013-11-11 MED ORDER — LISINOPRIL 20 MG PO TABS
ORAL_TABLET | ORAL | Status: DC
Start: 2013-11-11 — End: 2014-01-02

## 2013-11-11 NOTE — Patient Instructions (Signed)

## 2013-11-11 NOTE — Progress Notes (Signed)
  Subjective:    Patient here for follow-up of elevated blood pressure.  She is exercising and is adherent to a low-salt diet.  Blood pressure is well controlled at home. Cardiac symptoms: none. Patient denies: chest pain, chest pressure/discomfort, claudication, dyspnea, exertional chest pressure/discomfort, fatigue, irregular heart beat, lower extremity edema, near-syncope, orthopnea, palpitations, paroxysmal nocturnal dyspnea, syncope and tachypnea. Cardiovascular risk factors: advanced age (older than 55 for men, 33 for women) and hypertension. Use of agents associated with hypertension: none. History of target organ damage: none.  The following portions of the patient's history were reviewed and updated as appropriate: allergies, current medications, past family history, past medical history, past social history, past surgical history and problem list.  Review of Systems Pertinent items are noted in HPI.     Objective:    BP 118/70  Pulse 84  Resp 16  Wt 130 lb 3.2 oz (59.058 kg)  SpO2 97% General appearance: alert, cooperative, appears stated age and no distress Neck: no adenopathy, no carotid bruit, no JVD, supple, symmetrical, trachea midline and thyroid not enlarged, symmetric, no tenderness/mass/nodules Lungs: clear to auscultation bilaterally Heart: S1, S2 normal Extremities: extremities normal, atraumatic, no cyanosis or edema    Assessment:    Hypertension, normal blood pressure . Evidence of target organ damage: none.    Plan:    Medication: no change. Dietary sodium restriction. Regular aerobic exercise. Check blood pressures 2-3 times weekly and record. Follow up: 2 months and as needed. --  cpe

## 2013-11-11 NOTE — Progress Notes (Signed)
Pre visit review using our clinic review tool, if applicable. No additional management support is needed unless otherwise documented below in the visit note. 

## 2013-11-12 ENCOUNTER — Telehealth: Payer: Self-pay | Admitting: Family Medicine

## 2013-11-12 NOTE — Telephone Encounter (Signed)
Relevant patient education mailed to patient.  

## 2014-01-02 ENCOUNTER — Encounter: Payer: Self-pay | Admitting: Family Medicine

## 2014-01-02 ENCOUNTER — Ambulatory Visit (INDEPENDENT_AMBULATORY_CARE_PROVIDER_SITE_OTHER): Payer: Medicare Other | Admitting: Family Medicine

## 2014-01-02 VITALS — BP 138/82 | HR 103 | Temp 97.7°F | Ht 61.5 in | Wt 129.4 lb

## 2014-01-02 DIAGNOSIS — I10 Essential (primary) hypertension: Secondary | ICD-10-CM

## 2014-01-02 DIAGNOSIS — Z0000 Encounter for general adult medical examination without abnormal findings: Secondary | ICD-10-CM

## 2014-01-02 LAB — LIPID PANEL
CHOL/HDL RATIO: 3
CHOLESTEROL: 174 mg/dL (ref 0–200)
HDL: 57 mg/dL (ref >39.00)
LDL CALC: 90 mg/dL (ref 0–99)
NONHDL: 117
Triglycerides: 134 mg/dL (ref 0.0–149.0)
VLDL: 26.8 mg/dL (ref 0.0–40.0)

## 2014-01-02 LAB — BASIC METABOLIC PANEL
BUN: 24 mg/dL — AB (ref 6–23)
CO2: 28 mEq/L (ref 19–32)
Calcium: 10 mg/dL (ref 8.4–10.5)
Chloride: 104 mEq/L (ref 96–112)
Creatinine, Ser: 1.1 mg/dL (ref 0.4–1.2)
GFR: 50.36 mL/min — AB (ref >60.00)
Glucose, Bld: 102 mg/dL — ABNORMAL HIGH (ref 70–99)
POTASSIUM: 4.9 meq/L (ref 3.5–5.1)
SODIUM: 139 meq/L (ref 135–145)

## 2014-01-02 LAB — POCT URINALYSIS DIPSTICK
BILIRUBIN UA: NEGATIVE
Blood, UA: NEGATIVE
Glucose, UA: NEGATIVE
KETONES UA: NEGATIVE
LEUKOCYTES UA: NEGATIVE
Nitrite, UA: NEGATIVE
PROTEIN UA: NEGATIVE
Spec Grav, UA: >=1.03
Urobilinogen, UA: 0.2
pH, UA: 5

## 2014-01-02 LAB — CBC WITH DIFFERENTIAL/PLATELET
Basophils Absolute: 0 10*3/uL (ref 0.0–0.1)
Basophils Relative: 0.5 % (ref 0.0–3.0)
EOS PCT: 1.7 % (ref 0.0–5.0)
Eosinophils Absolute: 0.1 10*3/uL (ref 0.0–0.7)
HEMATOCRIT: 41.1 % (ref 36.0–46.0)
Hemoglobin: 13.9 g/dL (ref 12.0–15.0)
LYMPHS ABS: 1.3 10*3/uL (ref 0.7–4.0)
Lymphocytes Relative: 17.5 % (ref 12.0–46.0)
MCHC: 33.9 g/dL (ref 30.0–36.0)
MCV: 88.6 fl (ref 78.0–100.0)
MONOS PCT: 6.4 % (ref 3.0–12.0)
Monocytes Absolute: 0.5 10*3/uL (ref 0.1–1.0)
Neutro Abs: 5.7 10*3/uL (ref 1.4–7.7)
Neutrophils Relative %: 73.9 % (ref 43.0–77.0)
PLATELETS: 232 10*3/uL (ref 150.0–400.0)
RBC: 4.64 Mil/uL (ref 3.87–5.11)
RDW: 13.4 % (ref 11.5–15.5)
WBC: 7.7 10*3/uL (ref 4.0–10.5)

## 2014-01-02 LAB — HEPATIC FUNCTION PANEL
ALT: 16 U/L (ref 0–35)
AST: 20 U/L (ref 0–37)
Albumin: 4.2 g/dL (ref 3.5–5.2)
Alkaline Phosphatase: 103 U/L (ref 39–117)
BILIRUBIN DIRECT: 0.1 mg/dL (ref 0.0–0.3)
BILIRUBIN TOTAL: 0.5 mg/dL (ref 0.2–1.2)
Total Protein: 7.9 g/dL (ref 6.0–8.3)

## 2014-01-02 MED ORDER — LISINOPRIL 20 MG PO TABS: ORAL_TABLET | ORAL | Status: DC

## 2014-01-02 MED ORDER — AMLODIPINE BESYLATE 10 MG PO TABS: ORAL_TABLET | ORAL | Status: DC

## 2014-01-02 NOTE — Patient Instructions (Signed)
Preventive Care for Adults A healthy lifestyle and preventive care can promote health and wellness. Preventive health guidelines for women include the following key practices.  A routine yearly physical is a good way to check with your health care provider about your health and preventive screening. It is a chance to share any concerns and updates on your health and to receive a thorough exam.  Visit your dentist for a routine exam and preventive care every 6 months. Brush your teeth twice a day and floss once a day. Good oral hygiene prevents tooth decay and gum disease.  The frequency of eye exams is based on your age, health, family medical history, use of contact lenses, and other factors. Follow your health care provider's recommendations for frequency of eye exams.  Eat a healthy diet. Foods like vegetables, fruits, whole grains, low-fat dairy products, and lean protein foods contain the nutrients you need without too many calories. Decrease your intake of foods high in solid fats, added sugars, and salt. Eat the right amount of calories for you.Get information about a proper diet from your health care provider, if necessary.  Regular physical exercise is one of the most important things you can do for your health. Most adults should get at least 150 minutes of moderate-intensity exercise (any activity that increases your heart rate and causes you to sweat) each week. In addition, most adults need muscle-strengthening exercises on 2 or more days a week.  Maintain a healthy weight. The body mass index (BMI) is a screening tool to identify possible weight problems. It provides an estimate of body fat based on height and weight. Your health care provider can find your BMI and can help you achieve or maintain a healthy weight.For adults 20 years and older:  A BMI below 18.5 is considered underweight.  A BMI of 18.5 to 24.9 is normal.  A BMI of 25 to 29.9 is considered overweight.  A BMI of  30 and above is considered obese.  Maintain normal blood lipids and cholesterol levels by exercising and minimizing your intake of saturated fat. Eat a balanced diet with plenty of fruit and vegetables. Blood tests for lipids and cholesterol should begin at age 76 and be repeated every 5 years. If your lipid or cholesterol levels are high, you are over 50, or you are at high risk for heart disease, you may need your cholesterol levels checked more frequently.Ongoing high lipid and cholesterol levels should be treated with medicines if diet and exercise are not working.  If you smoke, find out from your health care provider how to quit. If you do not use tobacco, do not start.  Lung cancer screening is recommended for adults aged 22-80 years who are at high risk for developing lung cancer because of a history of smoking. A yearly low-dose CT scan of the lungs is recommended for people who have at least a 30-pack-year history of smoking and are a current smoker or have quit within the past 15 years. A pack year of smoking is smoking an average of 1 pack of cigarettes a day for 1 year (for example: 1 pack a day for 30 years or 2 packs a day for 15 years). Yearly screening should continue until the smoker has stopped smoking for at least 15 years. Yearly screening should be stopped for people who develop a health problem that would prevent them from having lung cancer treatment.  If you are pregnant, do not drink alcohol. If you are breastfeeding,  be very cautious about drinking alcohol. If you are not pregnant and choose to drink alcohol, do not have more than 1 drink per day. One drink is considered to be 12 ounces (355 mL) of beer, 5 ounces (148 mL) of wine, or 1.5 ounces (44 mL) of liquor.  Avoid use of street drugs. Do not share needles with anyone. Ask for help if you need support or instructions about stopping the use of drugs.  High blood pressure causes heart disease and increases the risk of  stroke. Your blood pressure should be checked at least every 1 to 2 years. Ongoing high blood pressure should be treated with medicines if weight loss and exercise do not work.  If you are 75-52 years old, ask your health care provider if you should take aspirin to prevent strokes.  Diabetes screening involves taking a blood sample to check your fasting blood sugar level. This should be done once every 3 years, after age 15, if you are within normal weight and without risk factors for diabetes. Testing should be considered at a younger age or be carried out more frequently if you are overweight and have at least 1 risk factor for diabetes.  Breast cancer screening is essential preventive care for women. You should practice "breast self-awareness." This means understanding the normal appearance and feel of your breasts and may include breast self-examination. Any changes detected, no matter how small, should be reported to a health care provider. Women in their 58s and 30s should have a clinical breast exam (CBE) by a health care provider as part of a regular health exam every 1 to 3 years. After age 16, women should have a CBE every year. Starting at age 53, women should consider having a mammogram (breast X-ray test) every year. Women who have a family history of breast cancer should talk to their health care provider about genetic screening. Women at a high risk of breast cancer should talk to their health care providers about having an MRI and a mammogram every year.  Breast cancer gene (BRCA)-related cancer risk assessment is recommended for women who have family members with BRCA-related cancers. BRCA-related cancers include breast, ovarian, tubal, and peritoneal cancers. Having family members with these cancers may be associated with an increased risk for harmful changes (mutations) in the breast cancer genes BRCA1 and BRCA2. Results of the assessment will determine the need for genetic counseling and  BRCA1 and BRCA2 testing.  Routine pelvic exams to screen for cancer are no longer recommended for nonpregnant women who are considered low risk for cancer of the pelvic organs (ovaries, uterus, and vagina) and who do not have symptoms. Ask your health care provider if a screening pelvic exam is right for you.  If you have had past treatment for cervical cancer or a condition that could lead to cancer, you need Pap tests and screening for cancer for at least 20 years after your treatment. If Pap tests have been discontinued, your risk factors (such as having a new sexual partner) need to be reassessed to determine if screening should be resumed. Some women have medical problems that increase the chance of getting cervical cancer. In these cases, your health care provider may recommend more frequent screening and Pap tests.  The HPV test is an additional test that may be used for cervical cancer screening. The HPV test looks for the virus that can cause the cell changes on the cervix. The cells collected during the Pap test can be  tested for HPV. The HPV test could be used to screen women aged 30 years and older, and should be used in women of any age who have unclear Pap test results. After the age of 30, women should have HPV testing at the same frequency as a Pap test.  Colorectal cancer can be detected and often prevented. Most routine colorectal cancer screening begins at the age of 50 years and continues through age 75 years. However, your health care provider may recommend screening at an earlier age if you have risk factors for colon cancer. On a yearly basis, your health care provider may provide home test kits to check for hidden blood in the stool. Use of a small camera at the end of a tube, to directly examine the colon (sigmoidoscopy or colonoscopy), can detect the earliest forms of colorectal cancer. Talk to your health care provider about this at age 50, when routine screening begins. Direct  exam of the colon should be repeated every 5-10 years through age 75 years, unless early forms of pre-cancerous polyps or small growths are found.  People who are at an increased risk for hepatitis B should be screened for this virus. You are considered at high risk for hepatitis B if:  You were born in a country where hepatitis B occurs often. Talk with your health care provider about which countries are considered high risk.  Your parents were born in a high-risk country and you have not received a shot to protect against hepatitis B (hepatitis B vaccine).  You have HIV or AIDS.  You use needles to inject street drugs.  You live with, or have sex with, someone who has hepatitis B.  You get hemodialysis treatment.  You take certain medicines for conditions like cancer, organ transplantation, and autoimmune conditions.  Hepatitis C blood testing is recommended for all people born from 1945 through 1965 and any individual with known risks for hepatitis C.  Practice safe sex. Use condoms and avoid high-risk sexual practices to reduce the spread of sexually transmitted infections (STIs). STIs include gonorrhea, chlamydia, syphilis, trichomonas, herpes, HPV, and human immunodeficiency virus (HIV). Herpes, HIV, and HPV are viral illnesses that have no cure. They can result in disability, cancer, and death.  You should be screened for sexually transmitted illnesses (STIs) including gonorrhea and chlamydia if:  You are sexually active and are younger than 24 years.  You are older than 24 years and your health care provider tells you that you are at risk for this type of infection.  Your sexual activity has changed since you were last screened and you are at an increased risk for chlamydia or gonorrhea. Ask your health care provider if you are at risk.  If you are at risk of being infected with HIV, it is recommended that you take a prescription medicine daily to prevent HIV infection. This is  called preexposure prophylaxis (PrEP). You are considered at risk if:  You are a heterosexual woman, are sexually active, and are at increased risk for HIV infection.  You take drugs by injection.  You are sexually active with a partner who has HIV.  Talk with your health care provider about whether you are at high risk of being infected with HIV. If you choose to begin PrEP, you should first be tested for HIV. You should then be tested every 3 months for as long as you are taking PrEP.  Osteoporosis is a disease in which the bones lose minerals and strength   with aging. This can result in serious bone fractures or breaks. The risk of osteoporosis can be identified using a bone density scan. Women ages 65 years and over and women at risk for fractures or osteoporosis should discuss screening with their health care providers. Ask your health care provider whether you should take a calcium supplement or vitamin D to reduce the rate of osteoporosis.  Menopause can be associated with physical symptoms and risks. Hormone replacement therapy is available to decrease symptoms and risks. You should talk to your health care provider about whether hormone replacement therapy is right for you.  Use sunscreen. Apply sunscreen liberally and repeatedly throughout the day. You should seek shade when your shadow is shorter than you. Protect yourself by wearing long sleeves, pants, a wide-brimmed hat, and sunglasses year round, whenever you are outdoors.  Once a month, do a whole body skin exam, using a mirror to look at the skin on your back. Tell your health care provider of new moles, moles that have irregular borders, moles that are larger than a pencil eraser, or moles that have changed in shape or color.  Stay current with required vaccines (immunizations).  Influenza vaccine. All adults should be immunized every year.  Tetanus, diphtheria, and acellular pertussis (Td, Tdap) vaccine. Pregnant women should  receive 1 dose of Tdap vaccine during each pregnancy. The dose should be obtained regardless of the length of time since the last dose. Immunization is preferred during the 27th-36th week of gestation. An adult who has not previously received Tdap or who does not know her vaccine status should receive 1 dose of Tdap. This initial dose should be followed by tetanus and diphtheria toxoids (Td) booster doses every 10 years. Adults with an unknown or incomplete history of completing a 3-dose immunization series with Td-containing vaccines should begin or complete a primary immunization series including a Tdap dose. Adults should receive a Td booster every 10 years.  Varicella vaccine. An adult without evidence of immunity to varicella should receive 2 doses or a second dose if she has previously received 1 dose. Pregnant females who do not have evidence of immunity should receive the first dose after pregnancy. This first dose should be obtained before leaving the health care facility. The second dose should be obtained 4-8 weeks after the first dose.  Human papillomavirus (HPV) vaccine. Females aged 13-26 years who have not received the vaccine previously should obtain the 3-dose series. The vaccine is not recommended for use in pregnant females. However, pregnancy testing is not needed before receiving a dose. If a female is found to be pregnant after receiving a dose, no treatment is needed. In that case, the remaining doses should be delayed until after the pregnancy. Immunization is recommended for any person with an immunocompromised condition through the age of 26 years if she did not get any or all doses earlier. During the 3-dose series, the second dose should be obtained 4-8 weeks after the first dose. The third dose should be obtained 24 weeks after the first dose and 16 weeks after the second dose.  Zoster vaccine. One dose is recommended for adults aged 60 years or older unless certain conditions are  present.  Measles, mumps, and rubella (MMR) vaccine. Adults born before 1957 generally are considered immune to measles and mumps. Adults born in 1957 or later should have 1 or more doses of MMR vaccine unless there is a contraindication to the vaccine or there is laboratory evidence of immunity to   each of the three diseases. A routine second dose of MMR vaccine should be obtained at least 28 days after the first dose for students attending postsecondary schools, health care workers, or international travelers. People who received inactivated measles vaccine or an unknown type of measles vaccine during 1963-1967 should receive 2 doses of MMR vaccine. People who received inactivated mumps vaccine or an unknown type of mumps vaccine before 1979 and are at high risk for mumps infection should consider immunization with 2 doses of MMR vaccine. For females of childbearing age, rubella immunity should be determined. If there is no evidence of immunity, females who are not pregnant should be vaccinated. If there is no evidence of immunity, females who are pregnant should delay immunization until after pregnancy. Unvaccinated health care workers born before 1957 who lack laboratory evidence of measles, mumps, or rubella immunity or laboratory confirmation of disease should consider measles and mumps immunization with 2 doses of MMR vaccine or rubella immunization with 1 dose of MMR vaccine.  Pneumococcal 13-valent conjugate (PCV13) vaccine. When indicated, a person who is uncertain of her immunization history and has no record of immunization should receive the PCV13 vaccine. An adult aged 19 years or older who has certain medical conditions and has not been previously immunized should receive 1 dose of PCV13 vaccine. This PCV13 should be followed with a dose of pneumococcal polysaccharide (PPSV23) vaccine. The PPSV23 vaccine dose should be obtained at least 8 weeks after the dose of PCV13 vaccine. An adult aged 19  years or older who has certain medical conditions and previously received 1 or more doses of PPSV23 vaccine should receive 1 dose of PCV13. The PCV13 vaccine dose should be obtained 1 or more years after the last PPSV23 vaccine dose.  Pneumococcal polysaccharide (PPSV23) vaccine. When PCV13 is also indicated, PCV13 should be obtained first. All adults aged 65 years and older should be immunized. An adult younger than age 65 years who has certain medical conditions should be immunized. Any person who resides in a nursing home or long-term care facility should be immunized. An adult smoker should be immunized. People with an immunocompromised condition and certain other conditions should receive both PCV13 and PPSV23 vaccines. People with human immunodeficiency virus (HIV) infection should be immunized as soon as possible after diagnosis. Immunization during chemotherapy or radiation therapy should be avoided. Routine use of PPSV23 vaccine is not recommended for American Indians, [place] Natives, or people younger than 65 years unless there are medical conditions that require PPSV23 vaccine. When indicated, people who have unknown immunization and have no record of immunization should receive PPSV23 vaccine. One-time revaccination 5 years after the first dose of PPSV23 is recommended for people aged 19-64 years who have chronic kidney failure, nephrotic syndrome, asplenia, or immunocompromised conditions. People who received 1-2 doses of PPSV23 before age 65 years should receive another dose of PPSV23 vaccine at age 65 years or later if at least 5 years have passed since the previous dose. Doses of PPSV23 are not needed for people immunized with PPSV23 at or after age 65 years.  Meningococcal vaccine. Adults with asplenia or persistent complement component deficiencies should receive 2 doses of quadrivalent meningococcal conjugate (MenACWY-D) vaccine. The doses should be obtained at least 2 months apart.  Microbiologists working with certain meningococcal bacteria, military recruits, people at risk during an outbreak, and people who travel to or live in countries with a high rate of meningitis should be immunized. A first-year college student up through age   21 years who is living in a residence hall should receive a dose if she did not receive a dose on or after her 16th birthday. Adults who have certain high-risk conditions should receive one or more doses of vaccine.  Hepatitis A vaccine. Adults who wish to be protected from this disease, have certain high-risk conditions, work with hepatitis A-infected animals, work in hepatitis A research labs, or travel to or work in countries with a high rate of hepatitis A should be immunized. Adults who were previously unvaccinated and who anticipate close contact with an international adoptee during the first 60 days after arrival in the [place] from a country with a high rate of hepatitis A should be immunized.  Hepatitis B vaccine. Adults who wish to be protected from this disease, have certain high-risk conditions, may be exposed to blood or other infectious body fluids, are household contacts or sex partners of hepatitis B positive people, are clients or workers in certain care facilities, or travel to or work in countries with a high rate of hepatitis B should be immunized.  Haemophilus influenzae type b (Hib) vaccine. A previously unvaccinated person with asplenia or sickle cell disease or having a scheduled splenectomy should receive 1 dose of Hib vaccine. Regardless of previous immunization, a recipient of a hematopoietic stem cell transplant should receive a 3-dose series 6-12 months after her successful transplant. Hib vaccine is not recommended for adults with HIV infection. Preventive Services / Frequency Ages 64 to 68 years  Blood pressure check.** / Every 1 to 2 years.  Lipid and cholesterol check.** / Every 5 years beginning at age  22.  Clinical breast exam.** / Every 3 years for women in their 88s and 53s.  BRCA-related cancer risk assessment.** / For women who have family members with a BRCA-related cancer (breast, ovarian, tubal, or peritoneal cancers).  Pap test.** / Every 2 years from ages 90 through 51. Every 3 years starting at age 21 through age 56 or 3 with a history of 3 consecutive normal Pap tests.  HPV screening.** / Every 3 years from ages 24 through ages 1 to 46 with a history of 3 consecutive normal Pap tests.  Hepatitis C blood test.** / For any individual with known risks for hepatitis C.  Skin self-exam. / Monthly.  Influenza vaccine. / Every year.  Tetanus, diphtheria, and acellular pertussis (Tdap, Td) vaccine.** / Consult your health care provider. Pregnant women should receive 1 dose of Tdap vaccine during each pregnancy. 1 dose of Td every 10 years.  Varicella vaccine.** / Consult your health care provider. Pregnant females who do not have evidence of immunity should receive the first dose after pregnancy.  HPV vaccine. / 3 doses over 6 months, if 72 and younger. The vaccine is not recommended for use in pregnant females. However, pregnancy testing is not needed before receiving a dose.  Measles, mumps, rubella (MMR) vaccine.** / You need at least 1 dose of MMR if you were born in 1957 or later. You may also need a 2nd dose. For females of childbearing age, rubella immunity should be determined. If there is no evidence of immunity, females who are not pregnant should be vaccinated. If there is no evidence of immunity, females who are pregnant should delay immunization until after pregnancy.  Pneumococcal 13-valent conjugate (PCV13) vaccine.** / Consult your health care provider.  Pneumococcal polysaccharide (PPSV23) vaccine.** / 1 to 2 doses if you smoke cigarettes or if you have certain conditions.  Meningococcal vaccine.** /  1 dose if you are age 19 to 21 years and a first-year college  student living in a residence hall, or have one of several medical conditions, you need to get vaccinated against meningococcal disease. You may also need additional booster doses.  Hepatitis A vaccine.** / Consult your health care provider.  Hepatitis B vaccine.** / Consult your health care provider.  Haemophilus influenzae type b (Hib) vaccine.** / Consult your health care provider. Ages 40 to 64 years  Blood pressure check.** / Every 1 to 2 years.  Lipid and cholesterol check.** / Every 5 years beginning at age 20 years.  Lung cancer screening. / Every year if you are aged 55-80 years and have a 30-pack-year history of smoking and currently smoke or have quit within the past 15 years. Yearly screening is stopped once you have quit smoking for at least 15 years or develop a health problem that would prevent you from having lung cancer treatment.  Clinical breast exam.** / Every year after age 40 years.  BRCA-related cancer risk assessment.** / For women who have family members with a BRCA-related cancer (breast, ovarian, tubal, or peritoneal cancers).  Mammogram.** / Every year beginning at age 40 years and continuing for as long as you are in good health. Consult with your health care provider.  Pap test.** / Every 3 years starting at age 30 years through age 65 or 70 years with a history of 3 consecutive normal Pap tests.  HPV screening.** / Every 3 years from ages 30 years through ages 65 to 70 years with a history of 3 consecutive normal Pap tests.  Fecal occult blood test (FOBT) of stool. / Every year beginning at age 50 years and continuing until age 75 years. You may not need to do this test if you get a colonoscopy every 10 years.  Flexible sigmoidoscopy or colonoscopy.** / Every 5 years for a flexible sigmoidoscopy or every 10 years for a colonoscopy beginning at age 50 years and continuing until age 75 years.  Hepatitis C blood test.** / For all people born from 1945 through  1965 and any individual with known risks for hepatitis C.  Skin self-exam. / Monthly.  Influenza vaccine. / Every year.  Tetanus, diphtheria, and acellular pertussis (Tdap/Td) vaccine.** / Consult your health care provider. Pregnant women should receive 1 dose of Tdap vaccine during each pregnancy. 1 dose of Td every 10 years.  Varicella vaccine.** / Consult your health care provider. Pregnant females who do not have evidence of immunity should receive the first dose after pregnancy.  Zoster vaccine.** / 1 dose for adults aged 60 years or older.  Measles, mumps, rubella (MMR) vaccine.** / You need at least 1 dose of MMR if you were born in 1957 or later. You may also need a 2nd dose. For females of childbearing age, rubella immunity should be determined. If there is no evidence of immunity, females who are not pregnant should be vaccinated. If there is no evidence of immunity, females who are pregnant should delay immunization until after pregnancy.  Pneumococcal 13-valent conjugate (PCV13) vaccine.** / Consult your health care provider.  Pneumococcal polysaccharide (PPSV23) vaccine.** / 1 to 2 doses if you smoke cigarettes or if you have certain conditions.  Meningococcal vaccine.** / Consult your health care provider.  Hepatitis A vaccine.** / Consult your health care provider.  Hepatitis B vaccine.** / Consult your health care provider.  Haemophilus influenzae type b (Hib) vaccine.** / Consult your health care provider. Ages 65   years and over  Blood pressure check.** / Every 1 to 2 years.  Lipid and cholesterol check.** / Every 5 years beginning at age 22 years.  Lung cancer screening. / Every year if you are aged 73-80 years and have a 30-pack-year history of smoking and currently smoke or have quit within the past 15 years. Yearly screening is stopped once you have quit smoking for at least 15 years or develop a health problem that would prevent you from having lung cancer  treatment.  Clinical breast exam.** / Every year after age 4 years.  BRCA-related cancer risk assessment.** / For women who have family members with a BRCA-related cancer (breast, ovarian, tubal, or peritoneal cancers).  Mammogram.** / Every year beginning at age 40 years and continuing for as long as you are in good health. Consult with your health care provider.  Pap test.** / Every 3 years starting at age 9 years through age 34 or 91 years with 3 consecutive normal Pap tests. Testing can be stopped between 65 and 70 years with 3 consecutive normal Pap tests and no abnormal Pap or HPV tests in the past 10 years.  HPV screening.** / Every 3 years from ages 57 years through ages 64 or 45 years with a history of 3 consecutive normal Pap tests. Testing can be stopped between 65 and 70 years with 3 consecutive normal Pap tests and no abnormal Pap or HPV tests in the past 10 years.  Fecal occult blood test (FOBT) of stool. / Every year beginning at age 15 years and continuing until age 17 years. You may not need to do this test if you get a colonoscopy every 10 years.  Flexible sigmoidoscopy or colonoscopy.** / Every 5 years for a flexible sigmoidoscopy or every 10 years for a colonoscopy beginning at age 86 years and continuing until age 71 years.  Hepatitis C blood test.** / For all people born from [birth year] through 1967 and any individual with known risks for hepatitis C.  Osteoporosis screening.** / A one-time screening for women ages 83 years and over and women at risk for fractures or osteoporosis.  Skin self-exam. / Monthly.  Influenza vaccine. / Every year.  Tetanus, diphtheria, and acellular pertussis (Tdap/Td) vaccine.** / 1 dose of Td every 10 years.  Varicella vaccine.** / Consult your health care provider.  Zoster vaccine.** / 1 dose for adults aged 61 years or older.  Pneumococcal 13-valent conjugate (PCV13) vaccine.** / Consult your health care provider.  Pneumococcal  polysaccharide (PPSV23) vaccine.** / 1 dose for all adults aged 28 years and older.  Meningococcal vaccine.** / Consult your health care provider.  Hepatitis A vaccine.** / Consult your health care provider.  Hepatitis B vaccine.** / Consult your health care provider.  Haemophilus influenzae type b (Hib) vaccine.** / Consult your health care provider. ** Family history and personal history of risk and conditions may change your health care provider's recommendations. Document Released: 05/17/2001 Document Revised: 08/05/2013 Document Reviewed: 08/16/2010 [facility] Patient Information 2015 [place], [place]. This information is not intended to replace advice given to you by your health care provider. Make sure you discuss any questions you have with your health care provider.

## 2014-01-02 NOTE — Progress Notes (Signed)
Pre visit review using our clinic review tool, if applicable. No additional management support is needed unless otherwise documented below in the visit note. 

## 2014-01-02 NOTE — Progress Notes (Signed)
Subjective:    Morgan Morrow is a 73 y.o. female who presents for Medicare Annual/Subsequent preventive examination.  Preventive Screening-Counseling & Management  Tobacco History  Smoking status  . Never Smoker   Smokeless tobacco  . Not on file     Problems Prior to Visit 1. none  Current Problems (verified) Patient Active Problem List   Diagnosis Date Noted  . Cough 11/01/2010  . HYPERGLYCEMIA 10/23/2009  . PAP SMEAR, ABNORMAL 02/10/2007  . HEMOCCULT POSITIVE STOOL 02/06/2007  . HYPERTENSION 10/23/2006  . ALLERGIC RHINITIS 10/23/2006    Medications Prior to Visit Current Outpatient Prescriptions on File Prior to Visit  Medication Sig Dispense Refill  . ibuprofen (ADVIL,MOTRIN) 200 MG tablet Take 200 mg by mouth every 6 (six) hours as needed.         No current facility-administered medications on file prior to visit.    Current Medications (verified) Current Outpatient Prescriptions  Medication Sig Dispense Refill  . amLODipine (NORVASC) 10 MG tablet TAKE ONE TABLET BY MOUTH ONE TIME DAILY  90 tablet  3  . ibuprofen (ADVIL,MOTRIN) 200 MG tablet Take 200 mg by mouth every 6 (six) hours as needed.        Marland Kitchen lisinopril (PRINIVIL,ZESTRIL) 20 MG tablet TAKE ONE TABLET BY MOUTH ONE TIME DAILY  90 tablet  3   No current facility-administered medications for this visit.     Allergies (verified) Review of patient's allergies indicates no known allergies.   PAST HISTORY  Family History Family History  Problem Relation Age of Onset  . Hypertension Mother   . Depression Mother   . Hypertension Father   . Arthritis Father     Social History History  Substance Use Topics  . Smoking status: Never Smoker   . Smokeless tobacco: Not on file  . Alcohol Use: No     Are there smokers in your home (other than you)? No  Risk Factors Current exercise habits: walking  Dietary issues discussed: none   Cardiac risk factors: advanced age (older than 38 for men, 71 for  women) and hypertension.  Depression Screen (Note: if answer to either of the following is "Yes", a more complete depression screening is indicated)   Over the past two weeks, have you felt down, depressed or hopeless? No  Over the past two weeks, have you felt little interest or pleasure in doing things? No  Have you lost interest or pleasure in daily life? No  Do you often feel hopeless? No  Do you cry easily over simple problems? No  Activities of Daily Living In your present state of health, do you have any difficulty performing the following activities?:  Driving? No Managing money?  No Feeding yourself? No Getting from bed to chair? No Climbing a flight of stairs? No Preparing food and eating?: No Bathing or showering? No Getting dressed: No Getting to the toilet? No Using the toilet:No Moving around from place to place: No In the past year have you fallen or had a near fall?:No   Are you sexually active?  No  Do you have more than one partner?  No  Hearing Difficulties: No Do you often ask people to speak up or repeat themselves? No Do you experience ringing or noises in your ears? No Do you have difficulty understanding soft or whispered voices? No   Do you feel that you have a problem with memory? No  Do you often misplace items? No  Do you feel safe at home?  No  Cognitive Testing  Alert? Yes  Normal Appearance?Yes  Oriented to person? Yes  Place? Yes   Time? Yes  Recall of three objects?  Yes  Can perform simple calculations? Yes  Displays appropriate judgment?Yes  Can read the correct time from a watch face?Yes   Advanced Directives have been discussed with the patient? Yes  List the Names of Other Physician/Practitioners you currently use: 1. [facility]    Indicate any recent [employer] you may have received from other than [facility] providers in the past year (date may be approximate).   There is no immunization history on file for this  patient.  Screening Tests Health Maintenance  Topic Date Due  . Influenza Vaccine  01/03/2015 (Originally 11/02/2013)  . Mammogram  01/03/2015 (Originally 10/24/2010)  . Pneumococcal Polysaccharide Vaccine Age 69 And Over  01/03/2015 (Originally )  . Colonoscopy  10/24/2019  . Tetanus/tdap  11/12/2023  . Zostavax  Addressed    All answers were reviewed with the patient and necessary referrals were made:  Morgan Koyanagi, DO   01/02/2014   History reviewed:  She  has a past medical history of Allergy and Hypertension. She  does not have any pertinent problems on file. She  has no past surgical history on file. Her family history includes Arthritis in her father; Depression in her mother; Hypertension in her father and mother. She  reports that she has never smoked. She does not have any smokeless tobacco history on file. She reports that she does not drink alcohol or use illicit drugs. She has a current medication list which includes the following prescription(s): amlodipine, ibuprofen, and lisinopril. Current Outpatient Prescriptions on File Prior to Visit  Medication Sig Dispense Refill  . ibuprofen (ADVIL,MOTRIN) 200 MG tablet Take 200 mg by mouth every 6 (six) hours as needed.         No current facility-administered medications on file prior to visit.   She has No Known Allergies.  Review of Systems  Review of Systems  Constitutional: Negative for activity change, appetite change and fatigue.  HENT: Negative for hearing loss, congestion, tinnitus and ear discharge.   Eyes: Negative for visual disturbance (see optho q2y -- vision corrected to 20/20 with glasses).  Respiratory: Negative for cough, chest tightness and shortness of breath.   Cardiovascular: Negative for chest pain, palpitations and leg swelling.  Gastrointestinal: Negative for abdominal pain, diarrhea, constipation and abdominal distention.  Genitourinary: Negative for urgency, frequency, decreased urine volume and  difficulty urinating.  Musculoskeletal: Negative for back pain, arthralgias and gait problem.  Skin: Negative for color change, pallor and rash.  Neurological: Negative for dizziness, light-headedness, numbness and headaches.  Hematological: Negative for adenopathy. Does not bruise/bleed easily.  Psychiatric/Behavioral: Negative for suicidal ideas, confusion, sleep disturbance, self-injury, dysphoric mood, decreased concentration and agitation.  Pt is able to read and write and can do all ADLs No risk for falling No abuse/ violence in home      Objective:     Vision by Snellen chart: right eye:20/40, left eye:20/40,  Both  20/25  Body mass index is 24.06 kg/(m^2). BP 138/82  Pulse 103  Temp(Src) 97.7 F (36.5 C) (Oral)  Ht 5' 1.5" (1.562 m)  Wt 129 lb 6.6 oz (58.7 kg)  BMI 24.06 kg/m2  SpO2 99%  BP 138/82  Pulse 103  Temp(Src) 97.7 F (36.5 C) (Oral)  Ht 5' 1.5" (1.562 m)  Wt 129 lb 6.6 oz (58.7 kg)  BMI 24.06 kg/m2  SpO2  99% General appearance: alert, cooperative, appears stated age and no distress Head: Normocephalic, without obvious abnormality, atraumatic Eyes: negative findings: lids and lashes normal and pupils equal, round, reactive to light and accomodation Ears: normal TM's and external ear canals both ears Nose: Nares normal. Septum midline. Mucosa normal. No drainage or sinus tenderness. Throat: lips, mucosa, and tongue normal; teeth and gums normal Neck: no adenopathy, no carotid bruit, no JVD, supple, symmetrical, trachea midline and thyroid not enlarged, symmetric, no tenderness/mass/nodules Back: symmetric, no curvature. ROM normal. No CVA tenderness. Lungs: clear to auscultation bilaterally Breasts: pt refused exam Heart: regular rate and rhythm, S1, S2 normal, no murmur, click, rub or gallop Abdomen: soft, non-tender; bowel sounds normal; no masses,  no organomegaly Pelvic: deferred and pt refused exam Extremities: extremities normal, atraumatic, no  cyanosis or edema Pulses: 2+ and symmetric Skin: nothing seen on back-- pt refused to put gown on Lymph nodes: Cervical, supraclavicular, and axillary nodes normal. Neurologic: Alert and oriented X 3, normal strength and tone. Normal symmetric reflexes. Normal coordination and gait Psych-- normal--no depression, no anxiety      Assessment:     cpe      Plan:     During the course of the visit the patient was educated and counseled about appropriate screening and preventive services including:    Advanced directives: has NO advanced directive - not interested in additional information -  Pt refused mammogram, bmd, all vaccines and would not put a gown on for exam Diet review for nutrition referral? Yes ____  Not Indicated ___x_   Patient Instructions (the written plan) was given to the patient.  Medicare Attestation I have personally reviewed: The patient's medical and social history Their use of alcohol, tobacco or illicit drugs Their current medications and supplements The patient's functional ability including ADLs,fall risks, home safety risks, cognitive, and hearing and visual impairment Diet and physical activities Evidence for depression or mood disorders  The patient's weight, height, BMI, and visual acuity have been recorded in the chart.  I have made referrals, counseling, and provided education to the patient based on review of the above and I have provided the patient with a written personalized care plan for preventive services.   1. Essential hypertension Check labs, con't meds - Basic metabolic panel - CBC with Differential - Hepatic function panel - Lipid panel - POCT urinalysis dipstick - amLODipine (NORVASC) 10 MG tablet; TAKE ONE TABLET BY MOUTH ONE TIME DAILY  Dispense: 90 tablet; Refill: 3 - lisinopril (PRINIVIL,ZESTRIL) 20 MG tablet; TAKE ONE TABLET BY MOUTH ONE TIME DAILY  Dispense: 90 tablet; Refill: 3  2. Preventative health care    Morgan Koyanagi, DO   01/02/2014

## 2014-06-17 DIAGNOSIS — H2513 Age-related nuclear cataract, bilateral: Secondary | ICD-10-CM | POA: Diagnosis not present

## 2014-06-17 DIAGNOSIS — H524 Presbyopia: Secondary | ICD-10-CM | POA: Diagnosis not present

## 2014-11-24 ENCOUNTER — Other Ambulatory Visit: Payer: Self-pay | Admitting: Family Medicine

## 2014-12-01 ENCOUNTER — Ambulatory Visit: Payer: Self-pay | Admitting: Family Medicine

## 2015-01-28 ENCOUNTER — Other Ambulatory Visit: Payer: Self-pay | Admitting: Family Medicine

## 2015-10-16 ENCOUNTER — Other Ambulatory Visit: Payer: Self-pay | Admitting: Family Medicine

## 2018-04-13 ENCOUNTER — Inpatient Hospital Stay (HOSPITAL_COMMUNITY)
Admission: EM | Admit: 2018-04-13 | Discharge: 2018-04-20 | DRG: 598 | Disposition: A | Discharge status: Skilled Nursing Facility | Payer: Medicare Other | Attending: Family Medicine | Admitting: Family Medicine

## 2018-04-13 ENCOUNTER — Encounter (HOSPITAL_COMMUNITY): Payer: Self-pay | Admitting: Emergency Medicine

## 2018-04-13 ENCOUNTER — Other Ambulatory Visit: Payer: Self-pay

## 2018-04-13 ENCOUNTER — Emergency Department (HOSPITAL_COMMUNITY): Payer: Medicare Other

## 2018-04-13 DIAGNOSIS — I10 Essential (primary) hypertension: Secondary | ICD-10-CM | POA: Diagnosis present

## 2018-04-13 DIAGNOSIS — Z8249 Family history of ischemic heart disease and other diseases of the circulatory system: Secondary | ICD-10-CM

## 2018-04-13 DIAGNOSIS — C787 Secondary malignant neoplasm of liver and intrahepatic bile duct: Secondary | ICD-10-CM | POA: Diagnosis present

## 2018-04-13 DIAGNOSIS — R195 Other fecal abnormalities: Secondary | ICD-10-CM

## 2018-04-13 DIAGNOSIS — K5641 Fecal impaction: Secondary | ICD-10-CM

## 2018-04-13 DIAGNOSIS — R1310 Dysphagia, unspecified: Secondary | ICD-10-CM

## 2018-04-13 DIAGNOSIS — R159 Full incontinence of feces: Secondary | ICD-10-CM | POA: Diagnosis present

## 2018-04-13 DIAGNOSIS — C50911 Malignant neoplasm of unspecified site of right female breast: Secondary | ICD-10-CM | POA: Diagnosis present

## 2018-04-13 DIAGNOSIS — I340 Nonrheumatic mitral (valve) insufficiency: Secondary | ICD-10-CM | POA: Diagnosis not present

## 2018-04-13 DIAGNOSIS — K626 Ulcer of anus and rectum: Secondary | ICD-10-CM | POA: Diagnosis not present

## 2018-04-13 DIAGNOSIS — K222 Esophageal obstruction: Secondary | ICD-10-CM | POA: Diagnosis present

## 2018-04-13 DIAGNOSIS — K921 Melena: Secondary | ICD-10-CM | POA: Diagnosis present

## 2018-04-13 DIAGNOSIS — E440 Moderate protein-calorie malnutrition: Secondary | ICD-10-CM

## 2018-04-13 DIAGNOSIS — K5130 Ulcerative (chronic) rectosigmoiditis without complications: Secondary | ICD-10-CM | POA: Diagnosis present

## 2018-04-13 DIAGNOSIS — C50919 Malignant neoplasm of unspecified site of unspecified female breast: Secondary | ICD-10-CM | POA: Diagnosis present

## 2018-04-13 DIAGNOSIS — R490 Dysphonia: Secondary | ICD-10-CM | POA: Diagnosis present

## 2018-04-13 DIAGNOSIS — R1312 Dysphagia, oropharyngeal phase: Secondary | ICD-10-CM | POA: Diagnosis present

## 2018-04-13 DIAGNOSIS — D649 Anemia, unspecified: Secondary | ICD-10-CM | POA: Diagnosis not present

## 2018-04-13 DIAGNOSIS — R945 Abnormal results of liver function studies: Secondary | ICD-10-CM | POA: Diagnosis present

## 2018-04-13 DIAGNOSIS — M8950 Osteolysis, unspecified site: Secondary | ICD-10-CM

## 2018-04-13 DIAGNOSIS — Z7722 Contact with and (suspected) exposure to environmental tobacco smoke (acute) (chronic): Secondary | ICD-10-CM | POA: Diagnosis not present

## 2018-04-13 DIAGNOSIS — R197 Diarrhea, unspecified: Secondary | ICD-10-CM | POA: Diagnosis present

## 2018-04-13 DIAGNOSIS — K5909 Other constipation: Secondary | ICD-10-CM

## 2018-04-13 DIAGNOSIS — C7801 Secondary malignant neoplasm of right lung: Secondary | ICD-10-CM | POA: Diagnosis present

## 2018-04-13 DIAGNOSIS — R64 Cachexia: Secondary | ICD-10-CM | POA: Diagnosis present

## 2018-04-13 DIAGNOSIS — C7802 Secondary malignant neoplasm of left lung: Secondary | ICD-10-CM | POA: Diagnosis present

## 2018-04-13 DIAGNOSIS — Z6820 Body mass index (BMI) 20.0-20.9, adult: Secondary | ICD-10-CM

## 2018-04-13 DIAGNOSIS — Z170 Estrogen receptor positive status [ER+]: Secondary | ICD-10-CM

## 2018-04-13 DIAGNOSIS — K5900 Constipation, unspecified: Secondary | ICD-10-CM

## 2018-04-13 DIAGNOSIS — K449 Diaphragmatic hernia without obstruction or gangrene: Secondary | ICD-10-CM | POA: Diagnosis present

## 2018-04-13 DIAGNOSIS — R680 Hypothermia, not associated with low environmental temperature: Secondary | ICD-10-CM | POA: Diagnosis present

## 2018-04-13 DIAGNOSIS — C792 Secondary malignant neoplasm of skin: Secondary | ICD-10-CM | POA: Diagnosis present

## 2018-04-13 DIAGNOSIS — D638 Anemia in other chronic diseases classified elsewhere: Secondary | ICD-10-CM | POA: Diagnosis present

## 2018-04-13 DIAGNOSIS — N63 Unspecified lump in unspecified breast: Secondary | ICD-10-CM

## 2018-04-13 DIAGNOSIS — C7951 Secondary malignant neoplasm of bone: Secondary | ICD-10-CM | POA: Diagnosis present

## 2018-04-13 DIAGNOSIS — Z79899 Other long term (current) drug therapy: Secondary | ICD-10-CM

## 2018-04-13 DIAGNOSIS — Z823 Family history of stroke: Secondary | ICD-10-CM

## 2018-04-13 DIAGNOSIS — R011 Cardiac murmur, unspecified: Secondary | ICD-10-CM | POA: Diagnosis present

## 2018-04-13 DIAGNOSIS — J181 Lobar pneumonia, unspecified organism: Secondary | ICD-10-CM | POA: Diagnosis not present

## 2018-04-13 DIAGNOSIS — I959 Hypotension, unspecified: Secondary | ICD-10-CM | POA: Diagnosis present

## 2018-04-13 DIAGNOSIS — R220 Localized swelling, mass and lump, head: Secondary | ICD-10-CM

## 2018-04-13 DIAGNOSIS — R627 Adult failure to thrive: Secondary | ICD-10-CM | POA: Diagnosis present

## 2018-04-13 DIAGNOSIS — E876 Hypokalemia: Secondary | ICD-10-CM | POA: Diagnosis present

## 2018-04-13 DIAGNOSIS — R54 Age-related physical debility: Secondary | ICD-10-CM | POA: Diagnosis present

## 2018-04-13 DIAGNOSIS — R1314 Dysphagia, pharyngoesophageal phase: Secondary | ICD-10-CM | POA: Diagnosis present

## 2018-04-13 DIAGNOSIS — J189 Pneumonia, unspecified organism: Secondary | ICD-10-CM

## 2018-04-13 DIAGNOSIS — I951 Orthostatic hypotension: Secondary | ICD-10-CM

## 2018-04-13 DIAGNOSIS — R112 Nausea with vomiting, unspecified: Secondary | ICD-10-CM

## 2018-04-13 DIAGNOSIS — R269 Unspecified abnormalities of gait and mobility: Secondary | ICD-10-CM | POA: Diagnosis present

## 2018-04-13 DIAGNOSIS — R0902 Hypoxemia: Secondary | ICD-10-CM

## 2018-04-13 DIAGNOSIS — Z8261 Family history of arthritis: Secondary | ICD-10-CM

## 2018-04-13 LAB — CBC WITH DIFFERENTIAL/PLATELET
Abs Immature Granulocytes: 0.04 10*3/uL (ref 0.00–0.07)
Basophils Absolute: 0 10*3/uL (ref 0.0–0.1)
Basophils Relative: 0 %
Eosinophils Absolute: 0 10*3/uL (ref 0.0–0.5)
Eosinophils Relative: 0 %
HCT: 33.5 % — ABNORMAL LOW (ref 36.0–46.0)
Hemoglobin: 10.7 g/dL — ABNORMAL LOW (ref 12.0–15.0)
Immature Granulocytes: 1 %
Lymphocytes Relative: 11 %
Lymphs Abs: 0.6 10*3/uL — ABNORMAL LOW (ref 0.7–4.0)
MCH: 30.6 pg (ref 26.0–34.0)
MCHC: 31.9 g/dL (ref 30.0–36.0)
MCV: 95.7 fL (ref 80.0–100.0)
Monocytes Absolute: 0.4 10*3/uL (ref 0.1–1.0)
Monocytes Relative: 7 %
Neutro Abs: 4.2 10*3/uL (ref 1.7–7.7)
Neutrophils Relative %: 81 %
Platelets: 216 10*3/uL (ref 150–400)
RBC: 3.5 MIL/uL — AB (ref 3.87–5.11)
RDW: 13.8 % (ref 11.5–15.5)
WBC: 5.2 10*3/uL (ref 4.0–10.5)
nRBC: 0 % (ref 0.0–0.2)

## 2018-04-13 LAB — LACTIC ACID, PLASMA
Lactic Acid, Venous: 1.6 mmol/L (ref 0.5–1.9)
Lactic Acid, Venous: 1.5 mmol/L (ref 0.5–1.9)

## 2018-04-13 LAB — COMPREHENSIVE METABOLIC PANEL
ALT: 30 U/L (ref 0–44)
AST: 103 U/L — ABNORMAL HIGH (ref 15–41)
Albumin: 3.2 g/dL — ABNORMAL LOW (ref 3.5–5.0)
Alkaline Phosphatase: 249 U/L — ABNORMAL HIGH (ref 38–126)
Anion gap: 12 (ref 5–15)
BUN: 51 mg/dL — ABNORMAL HIGH (ref 8–23)
CO2: 28 mmol/L (ref 22–32)
Calcium: 9.7 mg/dL (ref 8.9–10.3)
Chloride: 101 mmol/L (ref 98–111)
Creatinine, Ser: 1.09 mg/dL — ABNORMAL HIGH (ref 0.44–1.00)
GFR calc Af Amer: 56 mL/min — ABNORMAL LOW (ref >60)
GFR calc non Af Amer: 48 mL/min — ABNORMAL LOW (ref >60)
Glucose, Bld: 132 mg/dL — ABNORMAL HIGH (ref 70–99)
Potassium: 3.2 mmol/L — ABNORMAL LOW (ref 3.5–5.1)
SODIUM: 141 mmol/L (ref 135–145)
Total Bilirubin: 0.7 mg/dL (ref 0.3–1.2)
Total Protein: 6.6 g/dL (ref 6.5–8.1)

## 2018-04-13 LAB — POC OCCULT BLOOD, ED: FECAL OCCULT BLD: POSITIVE — AB

## 2018-04-13 LAB — MAGNESIUM: Magnesium: 2.3 mg/dL (ref 1.7–2.4)

## 2018-04-13 LAB — TROPONIN I: Troponin I: <0.03 ng/mL (ref <0.03)

## 2018-04-13 LAB — LIPASE, BLOOD: Lipase: 321 U/L — ABNORMAL HIGH (ref 11–51)

## 2018-04-13 MED ORDER — POTASSIUM CHLORIDE CRYS ER 20 MEQ PO TBCR
40.0000 meq | EXTENDED_RELEASE_TABLET | Freq: Once | ORAL | Status: AC
  Administered 2018-04-13: 40 meq via ORAL
  Filled 2018-04-13: qty 2

## 2018-04-13 MED ORDER — SODIUM CHLORIDE (PF) 0.9 % IJ SOLN
INTRAMUSCULAR | Status: AC
  Filled 2018-04-13: qty 50

## 2018-04-13 MED ORDER — SODIUM CHLORIDE 0.9 % IV SOLN
INTRAVENOUS | Status: DC
  Administered 2018-04-13 – 2018-04-16 (×3): via INTRAVENOUS

## 2018-04-13 MED ORDER — SODIUM CHLORIDE 0.9 % IV SOLN
1.0000 g | INTRAVENOUS | Status: DC
  Administered 2018-04-13 – 2018-04-14 (×2): 1 g via INTRAVENOUS
  Filled 2018-04-13: qty 1
  Filled 2018-04-13 (×2): qty 10

## 2018-04-13 MED ORDER — KCL IN DEXTROSE-NACL 10-5-0.45 MEQ/L-%-% IV SOLN
INTRAVENOUS | Status: DC
  Administered 2018-04-13: 23:00:00 via INTRAVENOUS
  Filled 2018-04-13 (×2): qty 1000

## 2018-04-13 MED ORDER — SODIUM CHLORIDE 0.9 % IV BOLUS
250.0000 mL | Freq: Once | INTRAVENOUS | Status: AC
  Administered 2018-04-13: 250 mL via INTRAVENOUS

## 2018-04-13 MED ORDER — IOPAMIDOL (ISOVUE-300) INJECTION 61%
100.0000 mL | Freq: Once | INTRAVENOUS | Status: AC | PRN
  Administered 2018-04-13: 80 mL via INTRAVENOUS

## 2018-04-13 MED ORDER — IOPAMIDOL (ISOVUE-300) INJECTION 61%
INTRAVENOUS | Status: AC
  Filled 2018-04-13: qty 100

## 2018-04-13 MED ORDER — SODIUM CHLORIDE 0.9 % IV BOLUS
500.0000 mL | Freq: Once | INTRAVENOUS | Status: AC
  Administered 2018-04-13: 500 mL via INTRAVENOUS

## 2018-04-13 MED ORDER — SODIUM CHLORIDE 0.9 % IV SOLN
500.0000 mg | INTRAVENOUS | Status: DC
  Administered 2018-04-13 – 2018-04-14 (×2): 500 mg via INTRAVENOUS
  Filled 2018-04-13 (×3): qty 500

## 2018-04-13 MED ORDER — DOXYCYCLINE HYCLATE 100 MG PO TABS
100.0000 mg | ORAL_TABLET | Freq: Two times a day (BID) | ORAL | Status: DC
  Administered 2018-04-13: 100 mg via ORAL
  Filled 2018-04-13: qty 1

## 2018-04-13 MED ORDER — IOHEXOL 300 MG/ML  SOLN
30.0000 mL | Freq: Once | INTRAMUSCULAR | Status: AC | PRN
  Administered 2018-04-13: 30 mL via ORAL

## 2018-04-13 MED ORDER — FLEET ENEMA 7-19 GM/118ML RE ENEM
1.0000 | ENEMA | Freq: Once | RECTAL | Status: AC
  Administered 2018-04-13: 1 via RECTAL
  Filled 2018-04-13: qty 1

## 2018-04-13 NOTE — ED Provider Notes (Signed)
[facility] DEPT Provider Note   CSN: [id number] Arrival date & time: 04/13/18  1613     History   Chief Complaint Chief Complaint  Patient presents with  . Diarrhea  . Fecal Impaction    HPI Morgan Morrow is a 78 y.o. female.  HPI  Pt was seen at 1630. Per pt, c/o gradual onset and persistence of multiple intermittent episodes of diarrhea that began 2 weeks ago. Describes the stools as "watery." Pt states she was constipated approximately 3 weeks ago, look a laxative, and then the diarrhea began. States the diarrhea "just runs out of me." Has been associated with generalized weakness/fatigue. Denies abd pain, no CP/SOB, no back pain, no fevers, no black or blood in stools, no N/V, no focal motor weakness, no falls, no syncope, no AMS.    Past Medical History:  Diagnosis Date  . Allergy   . Hypertension     Patient Active Problem List   Diagnosis Date Noted  . Cough 11/01/2010  . HYPERGLYCEMIA 10/23/2009  . PAP SMEAR, ABNORMAL 02/10/2007  . HEMOCCULT POSITIVE STOOL 02/06/2007  . HYPERTENSION 10/23/2006  . ALLERGIC RHINITIS 10/23/2006    History reviewed. No pertinent surgical history.   OB History   No obstetric history on file.      Home Medications    Prior to Admission medications   Medication Sig Start Date End Date Taking? Authorizing Provider  acetaminophen (TYLENOL) 500 MG tablet Take 500 mg by mouth every 6 (six) hours as needed for mild pain.   Yes [provider]  amLODipine (NORVASC) 10 MG tablet TAKE 1 TABLET (10 MG TOTAL) BY MOUTH DAILY. OFFICE VISIT DUE NOW Patient taking differently: Take 10 mg by mouth daily.  10/16/15  Yes Roma Schanz R, DO  ibuprofen (ADVIL,MOTRIN) 200 MG tablet Take 200-400 mg by mouth every 6 (six) hours as needed for mild pain.    Yes [provider]  lisinopril (PRINIVIL,ZESTRIL) 20 MG tablet TAKE ONE TABLET BY MOUTH ONE TIME DAILY Patient taking differently: Take 20  mg by mouth daily.  11/24/14  Yes Ann Held, DO    Family History Family History  Problem Relation Age of Onset  . Hypertension Mother   . Depression Mother   . Hypertension Father   . Arthritis Father     Social History Social History   Tobacco Use  . Smoking status: Never Smoker  Substance Use Topics  . Alcohol use: No  . Drug use: No     Allergies   Patient has no known allergies.   Review of Systems Review of Systems ROS: Statement: All systems negative except as marked or noted in the HPI; Constitutional: Negative for fever and chills. +generalized weakness/fatigue.; ; Eyes: Negative for eye pain, redness and discharge. ; ; ENMT: Negative for ear pain, hoarseness, nasal congestion, sinus pressure and sore throat. ; ; Cardiovascular: Negative for chest pain, palpitations, diaphoresis, dyspnea and peripheral edema. ; ; Respiratory: Negative for cough, wheezing and stridor. ; ; Gastrointestinal: +diarrhea. Negative for nausea, vomiting, abdominal pain, blood in stool, hematemesis, jaundice and rectal bleeding. . ; ; Genitourinary: Negative for dysuria, flank pain and hematuria. ; ; Musculoskeletal: Negative for back pain and neck pain. Negative for swelling and trauma.; ; Skin: Negative for pruritus, rash, abrasions, blisters, bruising and skin lesion.; ; Neuro: Negative for headache, lightheadedness and neck stiffness. Negative for altered level of consciousness, altered mental status, extremity weakness, paresthesias, involuntary movement, seizure and  syncope.       Physical Exam Updated Vital Signs BP 100/65 (BP Location: Right Arm)   Pulse 75   Temp (!) 93.5 F (34.2 C) (Rectal)   Resp 18   Ht 4' 11"  (1.499 m)   Wt 45.4 kg   SpO2 99%   BMI 20.20 kg/m    Patient Vitals for the past 24 hrs:  BP Temp Temp src Pulse Resp SpO2 Height Weight  04/13/18 1941 102/66 - - 75 14 97 % - -  04/13/18 1800 (!) 99/58 - - 66 12 99 % - -  04/13/18 1746 - (!) 93.5 F  (34.2 C) Rectal - - - - -  04/13/18 1643 - (!) 94.1 F (34.5 C) Rectal - - - - -  04/13/18 1638 - - - - - - 4' 11"  (1.499 m) 45.4 kg  04/13/18 1630 100/65 - - 75 18 98 % - -     17:52:49 Orthostatic Vital Signs MG  Orthostatic Lying   BP- Lying: 101/55  Pulse- Lying: 70      Orthostatic Sitting  BP- Sitting: 95/58  Pulse- Sitting: 70      Orthostatic Standing at 0 minutes  BP- Standing at 0 minutes: 95/55  Pulse- Standing at 0 minutes: 85     Physical Exam 1635: Physical examination:  Nursing notes reviewed; Vital signs and O2 SAT reviewed;  Constitutional: Well developed, Well nourished, In no acute distress; Head:  Normocephalic, atraumatic; Eyes: EOMI, PERRL, No scleral icterus; ENMT: Mouth and pharynx normal, Mucous membranes dry; Neck: Supple, Full range of motion, No lymphadenopathy; Cardiovascular: Regular rate and rhythm, No gallop; Respiratory: Breath sounds clear & equal bilaterally, No wheezes.  Speaking full sentences with ease, Normal respiratory effort/excursion; Chest: Nontender, Movement normal; Abdomen: Soft, Nontender, Nondistended, Normal bowel sounds. Rectal exam performed w/permission of pt and ED RN chaperone present.  Anal tone normal. +fecal impaction of large amount of brown stool in rectal vault, heme positive.;;; Genitourinary: No CVA tenderness; Extremities: Peripheral pulses normal, No tenderness, No edema, No calf edema or asymmetry.; Neuro: AA&Ox3, Major CN grossly intact.  Speech clear. No gross focal motor or sensory deficits in extremities.; Skin: Color normal, Warm, Dry.     ED Treatments / Results  Labs (all labs ordered are listed, but only abnormal results are displayed)   EKG EKG Interpretation  Date/Time:  Friday April 13 2018 16:59:42 EST Ventricular Rate:  73 PR Interval:    QRS Duration: 99 QT Interval:  441 QTC Calculation: 486 R Axis:   33 Text Interpretation:  Sinus rhythm Left atrial enlargement Borderline prolonged QT  interval Baseline wander Artifact No old tracing to compare Confirmed by Francine Graven [phone]) on 04/13/2018 5:03:43 PM   Radiology   Procedures Procedures (including critical care time)  Medications Ordered in ED Medications  0.9 %  sodium chloride infusion (has no administration in time range)  sodium chloride 0.9 % bolus 250 mL (has no administration in time range)     Initial Impression / Assessment and Plan / ED Course  I have reviewed the triage vital signs and the nursing notes.  Pertinent labs & imaging results that were available during my care of the patient were reviewed by me and considered in my medical decision making (see chart for details).  MDM Reviewed: previous chart, nursing note and vitals Reviewed previous: labs and ECG Interpretation: labs, ECG, x-ray and CT scan Total time providing critical care: 30-74 minutes. This excludes time spent performing separately  reportable procedures and services. Consults: admitting MD   CRITICAL CARE Performed by: Francine Graven Total critical care time: 35 minutes Critical care time was exclusive of separately billable procedures and treating other patients. Critical care was necessary to treat or prevent imminent or life-threatening deterioration. Critical care was time spent personally by me on the following activities: development of treatment plan with patient and/or surrogate as well as nursing, discussions with consultants, evaluation of patient's response to treatment, examination of patient, obtaining history from patient or surrogate, ordering and performing treatments and interventions, ordering and review of laboratory studies, ordering and review of radiographic studies, pulse oximetry and re-evaluation of patient's condition.   Results for orders placed or performed during the hospital encounter of 04/13/18  Comprehensive metabolic panel  Result Value Ref Range   Sodium 141 135 - 145 mmol/L   Potassium  3.2 (L) 3.5 - 5.1 mmol/L   Chloride 101 98 - 111 mmol/L   CO2 28 22 - 32 mmol/L   Glucose, Bld 132 (H) 70 - 99 mg/dL   BUN 51 (H) 8 - 23 mg/dL   Creatinine, Ser 1.09 (H) 0.44 - 1.00 mg/dL   Calcium 9.7 8.9 - 10.3 mg/dL   Total Protein 6.6 6.5 - 8.1 g/dL   Albumin 3.2 (L) 3.5 - 5.0 g/dL   AST 103 (H) 15 - 41 U/L   ALT 30 0 - 44 U/L   Alkaline Phosphatase 249 (H) 38 - 126 U/L   Total Bilirubin 0.7 0.3 - 1.2 mg/dL   GFR calc non Af Amer 48 (L) >60 mL/min   GFR calc Af Amer 56 (L) >60 mL/min   Anion gap 12 5 - 15  Lipase, blood  Result Value Ref Range   Lipase 321 (H) 11 - 51 U/L  Troponin I - Once  Result Value Ref Range   Troponin I <0.03 <0.03 ng/mL  CBC with Differential  Result Value Ref Range   WBC 5.2 4.0 - 10.5 K/uL   RBC 3.50 (L) 3.87 - 5.11 MIL/uL   Hemoglobin 10.7 (L) 12.0 - 15.0 g/dL   HCT 33.5 (L) 36.0 - 46.0 %   MCV 95.7 80.0 - 100.0 fL   MCH 30.6 26.0 - 34.0 pg   MCHC 31.9 30.0 - 36.0 g/dL   RDW 13.8 11.5 - 15.5 %   Platelets 216 150 - 400 K/uL   nRBC 0.0 0.0 - 0.2 %   Neutrophils Relative % 81 %   Neutro Abs 4.2 1.7 - 7.7 K/uL   Lymphocytes Relative 11 %   Lymphs Abs 0.6 (L) 0.7 - 4.0 K/uL   Monocytes Relative 7 %   Monocytes Absolute 0.4 0.1 - 1.0 K/uL   Eosinophils Relative 0 %   Eosinophils Absolute 0.0 0.0 - 0.5 K/uL   Basophils Relative 0 %   Basophils Absolute 0.0 0.0 - 0.1 K/uL   Immature Granulocytes 1 %   Abs Immature Granulocytes 0.04 0.00 - 0.07 K/uL  Lactic acid, plasma  Result Value Ref Range   Lactic Acid, Venous 1.6 0.5 - 1.9 mmol/L  POC occult blood, ED  Result Value Ref Range   Fecal Occult Bld POSITIVE (A) NEGATIVE   Dg Chest 2 View Result Date: 04/13/2018 CLINICAL DATA:  Per EMS, patient from home, c/o diarrhea worsening x2 days after taking a laxative. Denies N/V and abdominal pain. Weakness, short of breath, never a smoker, no other chest complaints EXAM: CHEST - 2 VIEW COMPARISON:  11/02/2010 FINDINGS: Patchy airspace opacities  in the right middle lobe, new since previous. Left lung clear. Heart size and mediastinal contours are within normal limits. No effusion. Visualized bones unremarkable. IMPRESSION: Patchy right middle lobe airspace disease suggesting pneumonia. Electronically Signed   By: Lucrezia Europe M.D.   On: 04/13/2018 17:33   Ct Abdomen Pelvis W Contrast Result Date: 04/13/2018 CLINICAL DATA:  Worsening diarrhea x2 days after taking laxatives. EXAM: CT ABDOMEN AND PELVIS WITH CONTRAST TECHNIQUE: Multidetector CT imaging of the abdomen and pelvis was performed using the standard protocol following bolus administration of intravenous contrast. CONTRAST:  3m ISOVUE-300 IOPAMIDOL (ISOVUE-300) INJECTION 61%, 352mOMNIPAQUE IOHEXOL 300 MG/ML SOLN COMPARISON:  None. FINDINGS: Lower chest: Small hiatal hernia. Mild thickening of what may represent the distal esophagus raises possibility of esophagitis versus partial volume averaging of the uppermost aspect of the hiatal hernia. Top normal heart size without pericardial effusion. Subpleural ill-defined pulmonary opacities that may represent postinfectious or inflammatory change versus atelectasis are identified. Some however appear more nodular raising concern for pulmonary nodules, example a 3 mm right lower lobe nodule on series 7/13 with smaller nodular densities seen in the right lower lobe. Given abnormality of the axial and appendicular skeleton concerning for diffuse osseous metastasis or myeloma, these nodular opacities raise concern for pulmonary metastasis as well. Hepatobiliary: Ill-defined hypodensities in the right hepatic lobe are identified, the largest measuring up to 14 mm with peripheral puddling of contrast suggestive of a hemangioma is noted. Additional 11 mm hypodensity in the right hepatic lobe also appears to demonstrate similar enhancement pattern on repeat delayed imaging and would be more in keeping with a hemangioma. Further caudad in the right hepatic lobe  is an ill-defined 12 mm hypodensity that appears to have filled in on repeat delayed imaging. Given pulmonary and osseous findings however, the possibility of hepatic metastasis is not excluded. MRI with liver protocol may help for further correlation. No biliary dilatation. Numerous gallstones are seen within the gallbladder. Pancreas: Normal Spleen: Normal size spleen with ill-defined hypodensity medially measuring 12 mm, nonspecific in etiology. Adrenals/Urinary Tract: Normal bilateral adrenal glands and kidneys. No obstructive uropathy. Urinary bladder is physiologically distended without focal mural thickening or calculus. Stomach/Bowel: Large stool ball in the rectum with circumferential anorectal thickening about this finding raises concern for stercoral proctocolitis. No bowel obstruction. The stomach and small intestine are nonacute. No evidence of acute appendicitis. Vascular/Lymphatic: Mild aortoiliac and branch vessel atherosclerosis without aneurysm. No adenopathy. Reproductive: Uterus and bilateral adnexa are unremarkable. Other: Mild soft tissue anasarca.  No ascites or free air. 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 Differential possibilities may include myeloma or diffuse osteo lytic metastasis. Moderate compression deformity of L1 with 50% height loss is noted as well as mild superior endplate compression of T11. No retropulsion is noted. IMPRESSION: 1. Ill-defined pulmonary opacities at the lung bases some which are nodular in appearance raising concern for possible metastatic disease. Concomitant in postinfectious or inflammatory abnormalities are not excluded. 2. 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 Differential possibilities may include osteolytic metastasis of unknown primary or myeloma among some considerations.  3. Three hypodense lesions of the liver that appear to demonstrate peripheral puddling of contrast more likely to represent hemangiomata. However given the pulmonary and osseous findings, metastatic disease is still within differential considerations. 4. Anal rectal circumferential mural thickening surrounding a large stool ball. Stercoral proctocolitis is raised. These results  were called by telephone at the time of interpretation on 04/13/2018 at 7:21 pm to Dr. Francine Graven , who verbally acknowledged these results. Electronically Signed   By: Ashley Royalty M.D.   On: 04/13/2018 19:21      1640:  With pt's permission and ED RN and ED Tech in exam room: pt disimpacted of large stool ball in rectum.   1945:  H/H lower than previous; stool heme positive. Pt orthostatic on VS; judicious IVF given. Bair Hugger placed for hypothermia. IV abx started for CAP. Potassium repleted PO. Concern for metastatic disease on CT scan; pt and family informed. Dx and testing d/w pt and family.  Questions answered.  Verb understanding, agreeable to admit. T/C returned from [facility] Dr. Si Raider, case discussed, including:  HPI, pertinent PM/SHx, VS/PE, dx testing, ED course and treatment:  Agreeable to admit.           Final Clinical Impressions(s) / ED Diagnoses   Final diagnoses:  None    ED Discharge Orders    None       Francine Graven, DO 04/15/18 1634

## 2018-04-13 NOTE — H&P (Addendum)
History and Physical    KAITLAN BIN NWG:[id number] DOB: Jul 25, 1940 DOA: 04/13/2018  PCP: Ann Held, DO  Patient coming from: home   Chief Complaint: weight loss, diarrhea  HPI: Morgan Morrow is a 78 y.o. female with medical history significant for htn, who presents with above.  History obtained from patient and her daughter.  Daughter reports decline in health over the past year, getting more rapid. Significant weight loss, difficulty swallowing, hoarseness of voice, difficulty with mobility, and increased confusion. Has a PCP, was last seen in approximately November, patient and family unaware of results of that work-up.  Presents to ED mainly because daughter says patient finally relented, which she explains to mean she has been concerned about her mother's health for some time. The only acute change is about 2 weeks of diarrhea, no blood or mucous. No fevers, no recent travel or antibiotics. No abdominal pain. Denies cough or shortness of breath. Denies fevers or recent falls.   ED Course: fecal disempaction, labs, antibiotics, fluids, ct, CXR  Review of Systems: As per HPI otherwise 10 point review of systems negative.    Past Medical History:  Diagnosis Date  . Allergy   . Hypertension     History reviewed. No pertinent surgical history.   reports that she has never smoked. She does not have any smokeless tobacco history on file. She reports that she does not drink alcohol or use drugs.  No Known Allergies  Family History  Problem Relation Age of Onset  . Hypertension Mother   . Depression Mother   . Hypertension Father   . Arthritis Father     Prior to Admission medications   Medication Sig Start Date End Date Taking? Authorizing Provider  acetaminophen (TYLENOL) 500 MG tablet Take 500 mg by mouth every 6 (six) hours as needed for mild pain.   Yes [provider]  amLODipine (NORVASC) 10 MG tablet TAKE 1 TABLET (10 MG TOTAL) BY MOUTH DAILY.  OFFICE VISIT DUE NOW Patient taking differently: Take 10 mg by mouth daily.  10/16/15  Yes Roma Schanz R, DO  ibuprofen (ADVIL,MOTRIN) 200 MG tablet Take 200-400 mg by mouth every 6 (six) hours as needed for mild pain.    Yes [provider]  lisinopril (PRINIVIL,ZESTRIL) 20 MG tablet TAKE ONE TABLET BY MOUTH ONE TIME DAILY Patient taking differently: Take 20 mg by mouth daily.  11/24/14  Yes Ann Held, DO    Physical Exam: Vitals:   04/13/18 1643 04/13/18 1746 04/13/18 1800 04/13/18 1941  BP:   (!) 99/58 102/66  Pulse:   66 75  Resp:   12 14  Temp: (!) 94.1 F (34.5 C) (!) 93.5 F (34.2 C)    TempSrc: Rectal Rectal    SpO2:   99% 97%  Weight:      Height:        Constitutional: No acute distress Head: Atraumatic, temporal wasting Eyes: Conjunctiva clear ENM: dry mucous membranes. poordentition.  Neck: Supple Respiratory: Clear to auscultation bilaterally but poor inspiratory effort. ,No accessory muscle use. . Cardiovascular: Regular rate and rhythm. Soft systolic murmur. Distant sounds Abdomen: Non-tender, non-distended. No masses. No rebound or guarding. Positive bowel sounds. Musculoskeletal: No joint deformity upper and lower extremities. Normal ROM, no contractures. Decreased muscle tone.  Skin: right breast is essentially consumed by an erythematous hard nodular lesion. Skin is very pale Extremities: No peripheral edema. Palpable peripheral pulses. Neurologic: Alert but somewhat confused and slow to  answer, moving all 4 extremities. Psychiatric: not assessed   Labs on Admission: I have personally reviewed following labs and imaging studies  CBC: Recent Labs  Lab 04/13/18 1740  WBC 5.2  NEUTROABS 4.2  HGB 10.7*  HCT 33.5*  MCV 95.7  PLT 937   Basic Metabolic Panel: Recent Labs  Lab 04/13/18 1740  NA 141  K 3.2*  CL 101  CO2 28  GLUCOSE 132*  BUN 51*  CREATININE 1.09*  CALCIUM 9.7  MG 2.3   GFR: Estimated Creatinine  Clearance: 28.5 mL/min (A) (by C-G formula based on SCr of 1.09 mg/dL (H)). Liver Function Tests: Recent Labs  Lab 04/13/18 1740  AST 103*  ALT 30  ALKPHOS 249*  BILITOT 0.7  PROT 6.6  ALBUMIN 3.2*   Recent Labs  Lab 04/13/18 1740  LIPASE 321*   No results for input(s): AMMONIA in the last 168 hours. Coagulation Profile: No results for input(s): INR, PROTIME in the last 168 hours. Cardiac Enzymes: Recent Labs  Lab 04/13/18 1740  TROPONINI <0.03   BNP (last 3 results) No results for input(s): PROBNP in the last 8760 hours. HbA1C: No results for input(s): HGBA1C in the last 72 hours. CBG: No results for input(s): GLUCAP in the last 168 hours. Lipid Profile: No results for input(s): CHOL, HDL, LDLCALC, TRIG, CHOLHDL, LDLDIRECT in the last 72 hours. Thyroid Function Tests: No results for input(s): TSH, T4TOTAL, FREET4, T3FREE, THYROIDAB in the last 72 hours. Anemia Panel: No results for input(s): VITAMINB12, FOLATE, FERRITIN, TIBC, IRON, RETICCTPCT in the last 72 hours. Urine analysis:    Component Value Date/Time   BILIRUBINUR neg 01/02/2014 1034   PROTEINUR neg 01/02/2014 1034   UROBILINOGEN 0.2 01/02/2014 1034   NITRITE neg 01/02/2014 1034   LEUKOCYTESUR Negative 01/02/2014 1034    Radiological Exams on Admission: Dg Chest 2 View  Result Date: 04/13/2018 CLINICAL DATA:  Per EMS, patient from home, c/o diarrhea worsening x2 days after taking a laxative. Denies N/V and abdominal pain. Weakness, short of breath, never a smoker, no other chest complaints EXAM: CHEST - 2 VIEW COMPARISON:  11/02/2010 FINDINGS: Patchy airspace opacities in the right middle lobe, new since previous. Left lung clear. Heart size and mediastinal contours are within normal limits. No effusion. Visualized bones unremarkable. IMPRESSION: Patchy right middle lobe airspace disease suggesting pneumonia. Electronically Signed   By: Lucrezia Europe M.D.   On: 04/13/2018 17:33   Ct Abdomen Pelvis W  Contrast  Result Date: 04/13/2018 CLINICAL DATA:  Worsening diarrhea x2 days after taking laxatives. EXAM: CT ABDOMEN AND PELVIS WITH CONTRAST TECHNIQUE: Multidetector CT imaging of the abdomen and pelvis was performed using the standard protocol following bolus administration of intravenous contrast. CONTRAST:  54m ISOVUE-300 IOPAMIDOL (ISOVUE-300) INJECTION 61%, 332mOMNIPAQUE IOHEXOL 300 MG/ML SOLN COMPARISON:  None. FINDINGS: Lower chest: Small hiatal hernia. Mild thickening of what may represent the distal esophagus raises possibility of esophagitis versus partial volume averaging of the uppermost aspect of the hiatal hernia. Top normal heart size without pericardial effusion. Subpleural ill-defined pulmonary opacities that may represent postinfectious or inflammatory change versus atelectasis are identified. Some however appear more nodular raising concern for pulmonary nodules, example a 3 mm right lower lobe nodule on series 7/13 with smaller nodular densities seen in the right lower lobe. Given abnormality of the axial and appendicular skeleton concerning for diffuse osseous metastasis or myeloma, these nodular opacities raise concern for pulmonary metastasis as well. Hepatobiliary: Ill-defined hypodensities in the right hepatic lobe are  identified, the largest measuring up to 14 mm with peripheral puddling of contrast suggestive of a hemangioma is noted. Additional 11 mm hypodensity in the right hepatic lobe also appears to demonstrate similar enhancement pattern on repeat delayed imaging and would be more in keeping with a hemangioma. Further caudad in the right hepatic lobe is an ill-defined 12 mm hypodensity that appears to have filled in on repeat delayed imaging. Given pulmonary and osseous findings however, the possibility of hepatic metastasis is not excluded. MRI with liver protocol may help for further correlation. No biliary dilatation. Numerous gallstones are seen within the gallbladder.  Pancreas: Normal Spleen: Normal size spleen with ill-defined hypodensity medially measuring 12 mm, nonspecific in etiology. Adrenals/Urinary Tract: Normal bilateral adrenal glands and kidneys. No obstructive uropathy. Urinary bladder is physiologically distended without focal mural thickening or calculus. Stomach/Bowel: Large stool ball in the rectum with circumferential anorectal thickening about this finding raises concern for stercoral proctocolitis. No bowel obstruction. The stomach and small intestine are nonacute. No evidence of acute appendicitis. Vascular/Lymphatic: Mild aortoiliac and branch vessel atherosclerosis without aneurysm. No adenopathy. Reproductive: Uterus and bilateral adnexa are unremarkable. Other: Mild soft tissue anasarca.  No ascites or free air. 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 Differential possibilities may include myeloma or diffuse osteo lytic metastasis. Moderate compression deformity of L1 with 50% height loss is noted as well as mild superior endplate compression of T11. No retropulsion is noted. IMPRESSION: 1. Ill-defined pulmonary opacities at the lung bases some which are nodular in appearance raising concern for possible metastatic disease. Concomitant in postinfectious or inflammatory abnormalities are not excluded. 2. 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 Differential possibilities may include osteolytic metastasis of unknown primary or myeloma among some considerations. 3. Three hypodense lesions of the liver that appear to demonstrate peripheral puddling of contrast more likely to represent hemangiomata. However given the pulmonary and osseous findings, metastatic disease is still within differential considerations. 4. Anal rectal circumferential mural thickening surrounding a large stool  ball. Stercoral proctocolitis is raised. These results were called by telephone at the time of interpretation on 04/13/2018 at 7:21 pm to Dr. Francine Graven , who verbally acknowledged these results. Electronically Signed   By: Ashley Royalty M.D.   On: 04/13/2018 19:21    EKG: Independently reviewed. 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 No overt symptoms of CAP, but is hypothermic and hypotensive, but hemodynamically otherwise stable. Think prudent to cover for CAP - ceftriaxone, azithromycin - f/u urine and blood cultures, note will be obtained after abx - swallow eval - IV fluids @ 75  # hypothermia - likely 2/2 metastatic cancer, malnutrition. Question infections process as above and below - cont bear hugger, abx  # Concern for metastatic cancer # right breast mass - One year of involuntary weight loss and increasing disability. CT showing pulnoary opacities, extensive osteolytic lesions, and hypodense lesions in liver as well as mural thickening in rectal mucosa, some or all concerning for malignancy. Right breast suspicious for primary - likely AM oncology consult  # Stercolitis - s/p disempaction in ED, though some residual stool ball seen on CT. No  signs perforation. Fecal occult positive. - fleets enema, follow clinically  # Diarrhea - likely sequelae of fecal impaction as above. Fecal occult positive, no recent abx - fleets enema ordered as above - f/u c diff and gi pathogen panel - azithromycin as above  # Anemia - h 10.7, was normal last check 2015. Fecal occult positive, but pt doesn't endorse hematochezia/melena. Question anemia chronic disease vs GI bleed. - cbc in AM  # Fecal occult blood - denies  melena/hematochezia. Could be 2/2 stercolitis, treating as above - cbc in am - hold dvt ppx for now  # hypokalemia - k 3.2, likely 2/2 poor po. ED has ordered po repletion - 10 meq of k with iv fluids - f/u mg and AM potassium  # elevated lipase - no abdominal pain, nausea, or vomiting. Normal pancreas on CT, no obstructing stone seen. Numerous possible hepatic meds, question whether 2/2 hepatic malignant process - iv fluids as above; repeat am lipase  DVT prophylaxis: SCDs Code Status: full Family Communication: daughter Peter Congo [phone]   Disposition Plan: tbd  Consults called: none thus far  Admission status: med/surg    Desma Maxim MD [facility] Pager [phone]  If 7PM-7AM, please contact night-coverage [email or web address] Password [facility]  04/13/2018, 8:17 PM

## 2018-04-13 NOTE — ED Triage Notes (Signed)
Per EMS, patient from home, c/o diarrhea worsening x2 days after taking a laxative. Denies N/V and abdominal pain. Ambulatory.

## 2018-04-13 NOTE — ED Notes (Signed)
Bed: WA02 Expected date:  Expected time:  Means of arrival:  Comments: EMS-diarrhea

## 2018-04-13 NOTE — ED Notes (Signed)
ED TO INPATIENT HANDOFF REPORT  Name/Age/Gender Morgan Morrow 78 y.o. female  Code Status   Home/SNF/Other Home  Chief Complaint Diarrhea   Level of Care/Admitting Diagnosis ED Disposition    ED Disposition Condition [place]: [place] [[id number]]  Level of Care: Med-Surg [16]  Diagnosis: Community acquired pneumonia [[id number]]  Admitting Physician: Eston Esters  Attending Physician: Gwynne Edinger [[id number]]  Estimated length of stay: past midnight tomorrow  Certification:: I certify this patient will need inpatient services for at least 2 midnights  PT Class (Do Not Modify): Inpatient [101]  PT Acc Code (Do Not Modify): Private [1]       Medical History Past Medical History:  Diagnosis Date  . Allergy   . Hypertension     Allergies No Known Allergies  IV Location/Drains/Wounds Patient Lines/Drains/Airways Status   Active Line/Drains/Airways    Name:   Placement date:   Placement time:   Site:   Days:   Peripheral IV 04/13/18 Left Antecubital   04/13/18    1735    Antecubital   less than 1          Labs/Imaging Results for orders placed or performed during the hospital encounter of 04/13/18 (from the past 48 hour(s))  POC occult blood, ED     Status: Abnormal   Collection Time: 04/13/18  5:04 PM  Result Value Ref Range   Fecal Occult Bld POSITIVE (A) NEGATIVE  Comprehensive metabolic panel     Status: Abnormal   Collection Time: 04/13/18  5:40 PM  Result Value Ref Range   Sodium 141 135 - 145 mmol/L   Potassium 3.2 (L) 3.5 - 5.1 mmol/L   Chloride 101 98 - 111 mmol/L   CO2 28 22 - 32 mmol/L   Glucose, Bld 132 (H) 70 - 99 mg/dL   BUN 51 (H) 8 - 23 mg/dL   Creatinine, Ser 1.09 (H) 0.44 - 1.00 mg/dL   Calcium 9.7 8.9 - 10.3 mg/dL   Total Protein 6.6 6.5 - 8.1 g/dL   Albumin 3.2 (L) 3.5 - 5.0 g/dL   AST 103 (H) 15 - 41 U/L   ALT 30 0 - 44 U/L   Alkaline Phosphatase 249 (H) 38 - 126 U/L    Total Bilirubin 0.7 0.3 - 1.2 mg/dL   GFR calc non Af Amer 48 (L) >60 mL/min   GFR calc Af Amer 56 (L) >60 mL/min   Anion gap 12 5 - 15    Comment: Performed at [facility], [place] [address] [place], [place] [address]  Lipase, blood     Status: Abnormal   Collection Time: 04/13/18  5:40 PM  Result Value Ref Range   Lipase 321 (H) 11 - 51 U/L    Comment: Performed at [facility] : [facility], [place] [address] [place], [place] [address]  Troponin I - Once     Status: None   Collection Time: 04/13/18  5:40 PM  Result Value Ref Range   Troponin I <0.03 <0.03 ng/mL    Comment: Performed at [facility], [place] [address], [place], [place] [address]  CBC with Differential     Status: Abnormal   Collection Time: 04/13/18  5:40 PM  Result Value Ref Range   WBC 5.2 4.0 - 10.5 K/uL   RBC 3.50 (L) 3.87 - 5.11 MIL/uL   Hemoglobin 10.7 (L) 12.0 - 15.0 g/dL   HCT 33.5 (L) 36.0 -  46.0 %   MCV 95.7 80.0 - 100.0 fL   MCH 30.6 26.0 - 34.0 pg   MCHC 31.9 30.0 - 36.0 g/dL   RDW 13.8 11.5 - 15.5 %   Platelets 216 150 - 400 K/uL   nRBC 0.0 0.0 - 0.2 %   Neutrophils Relative % 81 %   Neutro Abs 4.2 1.7 - 7.7 K/uL   Lymphocytes Relative 11 %   Lymphs Abs 0.6 (L) 0.7 - 4.0 K/uL   Monocytes Relative 7 %   Monocytes Absolute 0.4 0.1 - 1.0 K/uL   Eosinophils Relative 0 %   Eosinophils Absolute 0.0 0.0 - 0.5 K/uL   Basophils Relative 0 %   Basophils Absolute 0.0 0.0 - 0.1 K/uL   Immature Granulocytes 1 %   Abs Immature Granulocytes 0.04 0.00 - 0.07 K/uL    Comment: Performed at [facility], [place] [address], [place], [place] [address]  Magnesium     Status: None   Collection Time: 04/13/18  5:40 PM  Result Value Ref Range   Magnesium 2.3 1.7 - 2.4 mg/dL    Comment: Performed at [facility], [place] [address], [place], [place] [address]  Lactic acid, plasma     Status: None   Collection Time: 04/13/18  6:18 PM  Result Value  Ref Range   Lactic Acid, Venous 1.6 0.5 - 1.9 mmol/L    Comment: Performed at [facility], [place] [address], [place], [place] [address]   Dg Chest 2 View  Result Date: 04/13/2018 CLINICAL DATA:  Per EMS, patient from home, c/o diarrhea worsening x2 days after taking a laxative. Denies N/V and abdominal pain. Weakness, short of breath, never a smoker, no other chest complaints EXAM: CHEST - 2 VIEW COMPARISON:  11/02/2010 FINDINGS: Patchy airspace opacities in the right middle lobe, new since previous. Left lung clear. Heart size and mediastinal contours are within normal limits. No effusion. Visualized bones unremarkable. IMPRESSION: Patchy right middle lobe airspace disease suggesting pneumonia. Electronically Signed   By: Lucrezia Europe M.D.   On: 04/13/2018 17:33   Ct Abdomen Pelvis W Contrast  Result Date: 04/13/2018 CLINICAL DATA:  Worsening diarrhea x2 days after taking laxatives. EXAM: CT ABDOMEN AND PELVIS WITH CONTRAST TECHNIQUE: Multidetector CT imaging of the abdomen and pelvis was performed using the standard protocol following bolus administration of intravenous contrast. CONTRAST:  41m ISOVUE-300 IOPAMIDOL (ISOVUE-300) INJECTION 61%, 345mOMNIPAQUE IOHEXOL 300 MG/ML SOLN COMPARISON:  None. FINDINGS: Lower chest: Small hiatal hernia. Mild thickening of what may represent the distal esophagus raises possibility of esophagitis versus partial volume averaging of the uppermost aspect of the hiatal hernia. Top normal heart size without pericardial effusion. Subpleural ill-defined pulmonary opacities that may represent postinfectious or inflammatory change versus atelectasis are identified. Some however appear more nodular raising concern for pulmonary nodules, example a 3 mm right lower lobe nodule on series 7/13 with smaller nodular densities seen in the right lower lobe. Given abnormality of the axial and appendicular skeleton concerning for diffuse osseous metastasis or myeloma,  these nodular opacities raise concern for pulmonary metastasis as well. Hepatobiliary: Ill-defined hypodensities in the right hepatic lobe are identified, the largest measuring up to 14 mm with peripheral puddling of contrast suggestive of a hemangioma is noted. Additional 11 mm hypodensity in the right hepatic lobe also appears to demonstrate similar enhancement pattern on repeat delayed imaging and would be more in keeping with a hemangioma. Further caudad in the right hepatic lobe  is an ill-defined 12 mm hypodensity that appears to have filled in on repeat delayed imaging. Given pulmonary and osseous findings however, the possibility of hepatic metastasis is not excluded. MRI with liver protocol may help for further correlation. No biliary dilatation. Numerous gallstones are seen within the gallbladder. Pancreas: Normal Spleen: Normal size spleen with ill-defined hypodensity medially measuring 12 mm, nonspecific in etiology. Adrenals/Urinary Tract: Normal bilateral adrenal glands and kidneys. No obstructive uropathy. Urinary bladder is physiologically distended without focal mural thickening or calculus. Stomach/Bowel: Large stool ball in the rectum with circumferential anorectal thickening about this finding raises concern for stercoral proctocolitis. No bowel obstruction. The stomach and small intestine are nonacute. No evidence of acute appendicitis. Vascular/Lymphatic: Mild aortoiliac and branch vessel atherosclerosis without aneurysm. No adenopathy. Reproductive: Uterus and bilateral adnexa are unremarkable. Other: Mild soft tissue anasarca.  No ascites or free air. 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 Differential possibilities may include myeloma or diffuse osteo lytic metastasis. Moderate compression deformity of L1 with 50% height loss is noted as well as mild superior endplate compression of T11. No retropulsion is noted. IMPRESSION: 1.  Ill-defined pulmonary opacities at the lung bases some which are nodular in appearance raising concern for possible metastatic disease. Concomitant in postinfectious or inflammatory abnormalities are not excluded. 2. 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 Differential possibilities may include osteolytic metastasis of unknown primary or myeloma among some considerations. 3. Three hypodense lesions of the liver that appear to demonstrate peripheral puddling of contrast more likely to represent hemangiomata. However given the pulmonary and osseous findings, metastatic disease is still within differential considerations. 4. Anal rectal circumferential mural thickening surrounding a large stool ball. Stercoral proctocolitis is raised. These results were called by telephone at the time of interpretation on 04/13/2018 at 7:21 pm to Dr. Francine Graven , who verbally acknowledged these results. Electronically Signed   By: Ashley Royalty M.D.   On: 04/13/2018 19:21   EKG Interpretation  Date/Time:  Friday April 13 2018 16:59:42 EST Ventricular Rate:  73 PR Interval:    QRS Duration: 99 QT Interval:  441 QTC Calculation: 486 R Axis:   33 Text Interpretation:  Sinus rhythm Left atrial enlargement Borderline prolonged QT interval Baseline wander Artifact No old tracing to compare Confirmed by Francine Graven [phone]) on 04/13/2018 5:03:43 PM   Pending Labs Unresulted Labs (From admission, onward)    Start     Ordered   04/14/18 0500  Lipase, blood  Tomorrow morning,   R     04/13/18 2035   04/13/18 2025  Culture, Urine  Once,   R     04/13/18 2024   04/13/18 2024  Culture, blood (routine x 2)  BLOOD CULTURE X 2,   R     04/13/18 2024   04/13/18 1747  Lactic acid, plasma  Now then every 2 hours,   STAT     04/13/18 1747   04/13/18 1644  Urinalysis, Routine w reflex microscopic  ONCE -  STAT,   STAT     04/13/18 1644   04/13/18 1644  Urine culture  ONCE - STAT,   STAT     04/13/18 1644   Signed and Held  Comprehensive metabolic panel  Tomorrow morning,   R     Signed and Held   Signed and Held  CBC  Tomorrow morning,   R  Signed and Held   Signed and Held  Protime-INR  Tomorrow morning,   R     Signed and Held   Signed and Held  C difficile quick scan w PCR reflex  (C Difficile quick screen w PCR reflex panel)  Once, for 24 hours,   R     Signed and Held   Signed and Held  Gastrointestinal Panel by PCR , Stool  (Gastrointestinal Panel by PCR, Stool)  Once,   R     Signed and Held          Vitals/Pain Today's Vitals   04/13/18 2000 04/13/18 2030 04/13/18 2034 04/13/18 2100  BP: (!) 99/59 (!) 97/59  120/81  Pulse: 73 74 72 100  Resp:    (!) 23  Temp:   (!) 97.4 F (36.3 C)   TempSrc:   Oral   SpO2: 96% 99% 97% 96%  Weight:      Height:      PainSc:   0-No pain     Isolation Precautions No active isolations  Medications Medications  0.9 %  sodium chloride infusion ( Intravenous Transfusing/Transfer 04/13/18 2114)  iopamidol (ISOVUE-300) 61 % injection (has no administration in time range)  sodium chloride (PF) 0.9 % injection (has no administration in time range)  sodium chloride 0.9 % bolus 500 mL (500 mLs Intravenous New Bag/Given 04/13/18 2103)  cefTRIAXone (ROCEPHIN) 1 g in sodium chloride 0.9 % 100 mL IVPB (1 g Intravenous New Bag/Given 04/13/18 2055)  doxycycline (VIBRA-TABS) tablet 100 mg (100 mg Oral Given 04/13/18 2056)  sodium chloride 0.9 % bolus 250 mL (250 mLs Intravenous New Bag/Given 04/13/18 1819)  iopamidol (ISOVUE-300) 61 % injection 100 mL (80 mLs Intravenous Contrast Given 04/13/18 1838)  iohexol (OMNIPAQUE) 300 MG/ML solution 30 mL (30 mLs Oral Contrast Given 04/13/18 1700)  potassium chloride SA (K-DUR,KLOR-CON) CR tablet 40 mEq (40 mEq Oral Given 04/13/18 2056)    Mobility walks

## 2018-04-13 NOTE — ED Notes (Addendum)
Pt's R breast is red, hard, and inflammed.

## 2018-04-14 ENCOUNTER — Inpatient Hospital Stay (HOSPITAL_COMMUNITY): Payer: Medicare Other

## 2018-04-14 ENCOUNTER — Other Ambulatory Visit (HOSPITAL_COMMUNITY): Payer: Medicare Other

## 2018-04-14 ENCOUNTER — Encounter (HOSPITAL_COMMUNITY): Payer: Self-pay

## 2018-04-14 DIAGNOSIS — Z7722 Contact with and (suspected) exposure to environmental tobacco smoke (acute) (chronic): Secondary | ICD-10-CM

## 2018-04-14 DIAGNOSIS — R627 Adult failure to thrive: Secondary | ICD-10-CM | POA: Diagnosis present

## 2018-04-14 DIAGNOSIS — K626 Ulcer of anus and rectum: Secondary | ICD-10-CM

## 2018-04-14 DIAGNOSIS — D649 Anemia, unspecified: Secondary | ICD-10-CM

## 2018-04-14 DIAGNOSIS — R197 Diarrhea, unspecified: Secondary | ICD-10-CM

## 2018-04-14 DIAGNOSIS — K5900 Constipation, unspecified: Secondary | ICD-10-CM

## 2018-04-14 DIAGNOSIS — C50911 Malignant neoplasm of unspecified site of right female breast: Principal | ICD-10-CM

## 2018-04-14 DIAGNOSIS — J181 Lobar pneumonia, unspecified organism: Secondary | ICD-10-CM

## 2018-04-14 DIAGNOSIS — N63 Unspecified lump in unspecified breast: Secondary | ICD-10-CM

## 2018-04-14 DIAGNOSIS — K5641 Fecal impaction: Secondary | ICD-10-CM

## 2018-04-14 DIAGNOSIS — C50919 Malignant neoplasm of unspecified site of unspecified female breast: Secondary | ICD-10-CM | POA: Diagnosis present

## 2018-04-14 DIAGNOSIS — E876 Hypokalemia: Secondary | ICD-10-CM

## 2018-04-14 DIAGNOSIS — R011 Cardiac murmur, unspecified: Secondary | ICD-10-CM

## 2018-04-14 LAB — COMPREHENSIVE METABOLIC PANEL
ALK PHOS: 221 U/L — AB (ref 38–126)
ALT: 27 U/L (ref 0–44)
AST: 111 U/L — ABNORMAL HIGH (ref 15–41)
Albumin: 2.5 g/dL — ABNORMAL LOW (ref 3.5–5.0)
Anion gap: 10 (ref 5–15)
BUN: 40 mg/dL — ABNORMAL HIGH (ref 8–23)
CALCIUM: 8.9 mg/dL (ref 8.9–10.3)
CO2: 26 mmol/L (ref 22–32)
CREATININE: 0.92 mg/dL (ref 0.44–1.00)
Chloride: 102 mmol/L (ref 98–111)
GFR calc Af Amer: >60 mL/min (ref >60)
GFR calc non Af Amer: 59 mL/min — ABNORMAL LOW (ref >60)
Glucose, Bld: 82 mg/dL (ref 70–99)
Potassium: 3.6 mmol/L (ref 3.5–5.1)
Sodium: 138 mmol/L (ref 135–145)
Total Bilirubin: 0.5 mg/dL (ref 0.3–1.2)
Total Protein: 5.3 g/dL — ABNORMAL LOW (ref 6.5–8.1)

## 2018-04-14 LAB — CBC
HCT: 28.1 % — ABNORMAL LOW (ref 36.0–46.0)
Hemoglobin: 8.9 g/dL — ABNORMAL LOW (ref 12.0–15.0)
MCH: 30.7 pg (ref 26.0–34.0)
MCHC: 31.7 g/dL (ref 30.0–36.0)
MCV: 96.9 fL (ref 80.0–100.0)
PLATELETS: 186 10*3/uL (ref 150–400)
RBC: 2.9 MIL/uL — ABNORMAL LOW (ref 3.87–5.11)
RDW: 13.9 % (ref 11.5–15.5)
WBC: 4.2 10*3/uL (ref 4.0–10.5)
nRBC: 0 % (ref 0.0–0.2)

## 2018-04-14 LAB — RETICULOCYTES
Immature Retic Fract: 13.7 % (ref 2.3–15.9)
RBC.: 3.62 MIL/uL — ABNORMAL LOW (ref 3.87–5.11)
RETIC CT PCT: 1.7 % (ref 0.4–3.1)
Retic Count, Absolute: 61.5 10*3/uL (ref 19.0–186.0)

## 2018-04-14 LAB — PROTIME-INR
INR: 0.97
Prothrombin Time: 12.8 seconds (ref 11.4–15.2)

## 2018-04-14 LAB — IRON AND TIBC
Iron: 57 ug/dL (ref 28–170)
SATURATION RATIOS: 18 % (ref 10.4–31.8)
TIBC: 311 ug/dL (ref 250–450)
UIBC: 254 ug/dL

## 2018-04-14 LAB — FERRITIN: Ferritin: 2513 ng/mL — ABNORMAL HIGH (ref 11–307)

## 2018-04-14 LAB — HEMOGLOBIN AND HEMATOCRIT, BLOOD
HCT: 31.8 % — ABNORMAL LOW (ref 36.0–46.0)
Hemoglobin: 9.9 g/dL — ABNORMAL LOW (ref 12.0–15.0)

## 2018-04-14 LAB — LIPASE, BLOOD: Lipase: 190 U/L — ABNORMAL HIGH (ref 11–51)

## 2018-04-14 LAB — VITAMIN B12: Vitamin B-12: 678 pg/mL (ref 180–914)

## 2018-04-14 LAB — FOLATE: Folate: 18.4 ng/mL (ref >5.9)

## 2018-04-14 MED ORDER — GUAIFENESIN ER 600 MG PO TB12
1200.0000 mg | ORAL_TABLET | Freq: Two times a day (BID) | ORAL | Status: DC
  Administered 2018-04-14 – 2018-04-20 (×11): 1200 mg via ORAL
  Filled 2018-04-14 (×13): qty 2

## 2018-04-14 MED ORDER — IOHEXOL 300 MG/ML  SOLN
75.0000 mL | Freq: Once | INTRAMUSCULAR | Status: AC | PRN
  Administered 2018-04-14: 75 mL via INTRAVENOUS

## 2018-04-14 MED ORDER — FLUTICASONE PROPIONATE 50 MCG/ACT NA SUSP
2.0000 | Freq: Every day | NASAL | Status: DC
  Administered 2018-04-14 – 2018-04-20 (×7): 2 via NASAL
  Filled 2018-04-14: qty 16

## 2018-04-14 MED ORDER — PANTOPRAZOLE SODIUM 40 MG PO TBEC
40.0000 mg | DELAYED_RELEASE_TABLET | Freq: Every day | ORAL | Status: DC
  Filled 2018-04-14: qty 1

## 2018-04-14 MED ORDER — BISACODYL 10 MG RE SUPP
10.0000 mg | Freq: Every day | RECTAL | Status: DC
  Administered 2018-04-14 – 2018-04-18 (×3): 10 mg via RECTAL
  Filled 2018-04-14 (×3): qty 1

## 2018-04-14 MED ORDER — IPRATROPIUM-ALBUTEROL 0.5-2.5 (3) MG/3ML IN SOLN
3.0000 mL | Freq: Two times a day (BID) | RESPIRATORY_TRACT | Status: DC
  Administered 2018-04-14 – 2018-04-15 (×2): 3 mL via RESPIRATORY_TRACT
  Filled 2018-04-14: qty 3

## 2018-04-14 MED ORDER — PANTOPRAZOLE SODIUM 40 MG IV SOLR
40.0000 mg | Freq: Two times a day (BID) | INTRAVENOUS | Status: DC
  Administered 2018-04-14 – 2018-04-20 (×11): 40 mg via INTRAVENOUS
  Filled 2018-04-14 (×12): qty 40

## 2018-04-14 MED ORDER — SENNOSIDES-DOCUSATE SODIUM 8.6-50 MG PO TABS
1.0000 | ORAL_TABLET | Freq: Two times a day (BID) | ORAL | Status: DC
  Administered 2018-04-14 – 2018-04-18 (×6): 1 via ORAL
  Filled 2018-04-14 (×7): qty 1

## 2018-04-14 MED ORDER — SODIUM CHLORIDE (PF) 0.9 % IJ SOLN
INTRAMUSCULAR | Status: AC
  Filled 2018-04-14: qty 50

## 2018-04-14 MED ORDER — LORATADINE 10 MG PO TABS
10.0000 mg | ORAL_TABLET | Freq: Every day | ORAL | Status: DC
  Administered 2018-04-14 – 2018-04-20 (×6): 10 mg via ORAL
  Filled 2018-04-14 (×6): qty 1

## 2018-04-14 MED ORDER — RESOURCE THICKENUP CLEAR PO POWD
ORAL | Status: DC | PRN
  Filled 2018-04-14: qty 125

## 2018-04-14 MED ORDER — KETOROLAC TROMETHAMINE 30 MG/ML IJ SOLN
15.0000 mg | Freq: Once | INTRAMUSCULAR | Status: AC
  Administered 2018-04-14: 15 mg via INTRAVENOUS
  Filled 2018-04-14: qty 1

## 2018-04-14 MED ORDER — IPRATROPIUM-ALBUTEROL 0.5-2.5 (3) MG/3ML IN SOLN
3.0000 mL | Freq: Three times a day (TID) | RESPIRATORY_TRACT | Status: DC
  Administered 2018-04-14: 3 mL via RESPIRATORY_TRACT
  Filled 2018-04-14: qty 3

## 2018-04-14 NOTE — Progress Notes (Signed)
RN administered fleet enema. Pt had small BM before enema was given. Placed on bedpan and only fluid from enema was released into the bedpan. Pt stated she felt better but no stool was noted as a result of the fleet enema. Will continue to monitor.

## 2018-04-14 NOTE — Progress Notes (Signed)
PROGRESS NOTE    Morgan Morrow  NFA:[id number] DOB: [birth year] DOA: 04/13/2018 PCP: Leighton Ruff, MD   Brief Narrative:  Per Dr Peyton Najjar Morgan Morrow is a 78 y.o. female with medical history significant for htn, who presented with weight loss and diarrhea.    History obtained from patient and her daughter.  Daughter reported decline in health over the past year, getting more rapid. Significant weight loss, difficulty swallowing, hoarseness of voice, difficulty with mobility, and increased confusion. Has a PCP, was last seen in approximately November, patient and family unaware of results of that work-up.  Presented to ED mainly because daughter says patient finally relented, which she explains to mean she has been concerned about her mother's health for some time. The only acute change is about 2 weeks of diarrhea, no blood or mucous. No fevers, no recent travel or antibiotics. No abdominal pain. Denies cough or shortness of breath. Denies fevers or recent falls.   ED Course: fecal disempaction, labs, antibiotics, fluids, ct, CXR    Assessment & Plan:   Principal Problem:   FTT (failure to thrive) in adult Active Problems:   Metastatic breast cancer ([place])   Cachexia ([place])   Hematochezia   Community acquired pneumonia   Stercoral ulcer of anus   Constipation   Osteolytic lesion   Anemia   Murmur, cardiac  1 probable metastatic breast cancer/FTT Patient notes to have right breast cancer, weight loss, increasing disability.  CT abdomen and pelvis with pulmonary opacities, extensive osteolytic lesions, hypodense lesions in the liver as well as mural thickening in the rectal mucosal and esophageal thickening.  Concerned that primary may be breast cancer.  Patient states last mammogram was approximately 10 years ago.  Oncology has been consulted and patient seen in consultation by Dr. Jana Hakim early on this morning.  CT head, CT chest has been ordered for staging.  And CA  27-29 has been ordered.  Oncology patient will need a biopsy.  Per oncology.  2.  Dysphagia Patient with dysphasia and difficulty swallowing however unable to fully describe whether just to solids or liquids.  CT abdomen and pelvis with esophageal thickening.  Patient with significant weight loss, debility, hoarseness of voice.  Patient noted to be FOBT positive and anemic.  Hemoglobin currently at 8.9 from 10.7 on admission.  Likely partly dilutional.  Check an anemia panel.  Placed on PPI I IV every 12 hours.  Consult with GI for further evaluation and management.  Patient may likely require upper endoscopy and colonoscopy.  3.  Fecal impaction/constipation/stercoral ulcer of the anus Fecal disimpaction was attempted in the ED.  Residual stool ball noted on CT.  No signs of perforation.  FOBT positive.  Patient given a fleets enema yesterday however no significant results.  Order suppository daily.  Soapsuds enema.  Follow.  4.  Anemia Check an anemia panel.  Follow H&H.  Hemoglobin currently at 8.9 from 10.7 on admission likely partly dilutional.  FOBT was positive.  Follow H&H.  Place on IV PPI twice daily.  Consult with GI for further evaluation and management.  5.  Murmur Noted on examination.  Check a 2D echo.  6.  Diarrhea Likely secondary to stool incontinence secondary to fecal impaction/constipation.  Discontinue C. difficile PCR and enteric precautions.  7.  Hypokalemia Magnesium level at 2.3.  Repleted.  8.  Elevated lipase Patient denies any epigastric abdominal pain.  Tolerating diet.  Follow.  9.  Community-acquired pneumonia Chest x-ray was suggestive  of a pneumonia.  CT findings most suggestive of metastatic process ongoing.  Patient noted to be hypothermic and hypotensive on admission.  Patient pancultured.  Swallow evaluation pending.  Continue hydration with IV fluids.  Continue empiric IV Rocephin and azithromycin.  Follow.  10.  Hypothermia Likely secondary to  metastatic cancer malnutrition.  Patient also noted to have concerns for community-acquired pneumonia.  Currently on [facility].  Hypothermia improved.  Continue IV antibiotics.  Follow.   DVT prophylaxis: SCDs Code Status: Full Family Communication: Updated patient and daughter at bedside. Disposition Plan: Pending work-up.  Likely skilled nursing facility.   Consultants:   Oncology: Dr. Jana Hakim 04/14/2018  Gastroenterology pending  Procedures:  .  CT abdomen and pelvis 04/13/2018 Chest x-ray 04/13/2018    Antimicrobials:   None   Subjective: Patient sitting up in bed.  Still with some difficulty swallowing however unable to describe it.  Denies any shortness of breath.  Denies any chest pain.  Denies any overt GI bleed.  Objective: Vitals:   04/13/18 2034 04/13/18 2100 04/13/18 2137 04/14/18 0507  BP:  120/81 98/67 (!) 96/57  Pulse: 72 100 76 72  Resp:  (!) 23 19 20   Temp: (!) 97.4 F (36.3 C)  97.6 F (36.4 C) 97.9 F (36.6 C)  TempSrc: Oral  Oral Oral  SpO2: 97% 96% 98% 95%  Weight:      Height:        Intake/Output Summary (Last 24 hours) at 04/14/2018 1228 Last data filed at 04/14/2018 0300 Gross per 24 hour  Intake 593.46 ml  Output -  Net 593.46 ml   Filed Weights   04/13/18 1638  Weight: 45.4 kg    Examination:  General exam: Pallor Respiratory system: Clear to auscultation. Respiratory effort normal. Cardiovascular system: RRR with 3/6 SEM. No JVD, murmurs, rubs, gallops or clicks. No pedal edema. Gastrointestinal system: Abdomen is nondistended, soft and nontender. No organomegaly or masses felt. Normal bowel sounds heard. Central nervous system: Alert and oriented. No focal neurological deficits. Extremities: Symmetric 5 x 5 power. Skin: No rashes, lesions or ulcers Psychiatry: Judgement and insight appear normal. Mood & affect appropriate.  Breast; left breast normal.  Right breast with a large cancerous tissue    Data Reviewed: I have  personally reviewed following labs and imaging studies  CBC: Recent Labs  Lab 04/13/18 1740 04/14/18 0656  WBC 5.2 4.2  NEUTROABS 4.2  --   HGB 10.7* 8.9*  HCT 33.5* 28.1*  MCV 95.7 96.9  PLT 216 638   Basic Metabolic Panel: Recent Labs  Lab 04/13/18 1740 04/14/18 0656  NA 141 138  K 3.2* 3.6  CL 101 102  CO2 28 26  GLUCOSE 132* 82  BUN 51* 40*  CREATININE 1.09* 0.92  CALCIUM 9.7 8.9  MG 2.3  --    GFR: Estimated Creatinine Clearance: 33.8 mL/min (by C-G formula based on SCr of 0.92 mg/dL). Liver Function Tests: Recent Labs  Lab 04/13/18 1740 04/14/18 0656  AST 103* 111*  ALT 30 27  ALKPHOS 249* 221*  BILITOT 0.7 0.5  PROT 6.6 5.3*  ALBUMIN 3.2* 2.5*   Recent Labs  Lab 04/13/18 1740 04/14/18 0656  LIPASE 321* 190*   No results for input(s): AMMONIA in the last 168 hours. Coagulation Profile: Recent Labs  Lab 04/14/18 0656  INR 0.97   Cardiac Enzymes: Recent Labs  Lab 04/13/18 1740  TROPONINI <0.03   BNP (last 3 results) No results for input(s): PROBNP in the  last 8760 hours. HbA1C: No results for input(s): HGBA1C in the last 72 hours. CBG: No results for input(s): GLUCAP in the last 168 hours. Lipid Profile: No results for input(s): CHOL, HDL, LDLCALC, TRIG, CHOLHDL, LDLDIRECT in the last 72 hours. Thyroid Function Tests: No results for input(s): TSH, T4TOTAL, FREET4, T3FREE, THYROIDAB in the last 72 hours. Anemia Panel: Recent Labs    04/14/18 0955  VITAMINB12 678  FOLATE 18.4  FERRITIN 2,513*  TIBC 311  IRON 57  RETICCTPCT 1.7   Sepsis Labs: Recent Labs  Lab 04/13/18 1818 04/13/18 2316  LATICACIDVEN 1.6 1.5    Recent Results (from the past 240 hour(s))  Culture, blood (routine x 2)     Status: None (Preliminary result)   Collection Time: 04/13/18 10:02 PM  Result Value Ref Range Status   Specimen Description   Final    BLOOD RIGHT HAND Performed at [facility], [place] [address] [place], [place] [address]     Special Requests   Final    BOTTLES DRAWN AEROBIC ONLY Blood Culture adequate volume Performed at [facility] [address] [place], [place] [address]    Culture PENDING  Incomplete   Report Status PENDING  Incomplete         Radiology Studies: Dg Chest 2 View  Result Date: 04/13/2018 CLINICAL DATA:  Per EMS, patient from home, c/o diarrhea worsening x2 days after taking a laxative. Denies N/V and abdominal pain. Weakness, short of breath, never a smoker, no other chest complaints EXAM: CHEST - 2 VIEW COMPARISON:  11/02/2010 FINDINGS: Patchy airspace opacities in the right middle lobe, new since previous. Left lung clear. Heart size and mediastinal contours are within normal limits. No effusion. Visualized bones unremarkable. IMPRESSION: Patchy right middle lobe airspace disease suggesting pneumonia. Electronically Signed   By: Lucrezia Europe M.D.   On: 04/13/2018 17:33   Ct Abdomen Pelvis W Contrast  Result Date: 04/13/2018 CLINICAL DATA:  Worsening diarrhea x2 days after taking laxatives. EXAM: CT ABDOMEN AND PELVIS WITH CONTRAST TECHNIQUE: Multidetector CT imaging of the abdomen and pelvis was performed using the standard protocol following bolus administration of intravenous contrast. CONTRAST:  61m ISOVUE-300 IOPAMIDOL (ISOVUE-300) INJECTION 61%, 335mOMNIPAQUE IOHEXOL 300 MG/ML SOLN COMPARISON:  None. FINDINGS: Lower chest: Small hiatal hernia. Mild thickening of what may represent the distal esophagus raises possibility of esophagitis versus partial volume averaging of the uppermost aspect of the hiatal hernia. Top normal heart size without pericardial effusion. Subpleural ill-defined pulmonary opacities that may represent postinfectious or inflammatory change versus atelectasis are identified. Some however appear more nodular raising concern for pulmonary nodules, example a 3 mm right lower lobe nodule on series 7/13 with smaller nodular densities seen in the right  lower lobe. Given abnormality of the axial and appendicular skeleton concerning for diffuse osseous metastasis or myeloma, these nodular opacities raise concern for pulmonary metastasis as well. Hepatobiliary: Ill-defined hypodensities in the right hepatic lobe are identified, the largest measuring up to 14 mm with peripheral puddling of contrast suggestive of a hemangioma is noted. Additional 11 mm hypodensity in the right hepatic lobe also appears to demonstrate similar enhancement pattern on repeat delayed imaging and would be more in keeping with a hemangioma. Further caudad in the right hepatic lobe is an ill-defined 12 mm hypodensity that appears to have filled in on repeat delayed imaging. Given pulmonary and osseous findings however, the possibility of hepatic metastasis is not excluded. MRI with liver protocol may help for  further correlation. No biliary dilatation. Numerous gallstones are seen within the gallbladder. Pancreas: Normal Spleen: Normal size spleen with ill-defined hypodensity medially measuring 12 mm, nonspecific in etiology. Adrenals/Urinary Tract: Normal bilateral adrenal glands and kidneys. No obstructive uropathy. Urinary bladder is physiologically distended without focal mural thickening or calculus. Stomach/Bowel: Large stool ball in the rectum with circumferential anorectal thickening about this finding raises concern for stercoral proctocolitis. No bowel obstruction. The stomach and small intestine are nonacute. No evidence of acute appendicitis. Vascular/Lymphatic: Mild aortoiliac and branch vessel atherosclerosis without aneurysm. No adenopathy. Reproductive: Uterus and bilateral adnexa are unremarkable. Other: Mild soft tissue anasarca.  No ascites or free air. 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 Differential possibilities may include myeloma or diffuse osteo lytic metastasis. Moderate compression deformity of L1 with 50%  height loss is noted as well as mild superior endplate compression of T11. No retropulsion is noted. IMPRESSION: 1. Ill-defined pulmonary opacities at the lung bases some which are nodular in appearance raising concern for possible metastatic disease. Concomitant in postinfectious or inflammatory abnormalities are not excluded. 2. 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 Differential possibilities may include osteolytic metastasis of unknown primary or myeloma among some considerations. 3. Three hypodense lesions of the liver that appear to demonstrate peripheral puddling of contrast more likely to represent hemangiomata. However given the pulmonary and osseous findings, metastatic disease is still within differential considerations. 4. Anal rectal circumferential mural thickening surrounding a large stool ball. Stercoral proctocolitis is raised. These results were called by telephone at the time of interpretation on 04/13/2018 at 7:21 pm to Dr. Francine Graven , who verbally acknowledged these results. Electronically Signed   By: Ashley Royalty M.D.   On: 04/13/2018 19:21        Scheduled Meds: . bisacodyl  10 mg Rectal Daily  . fluticasone  2 spray Each Nare Daily  . guaiFENesin  1,200 mg Oral BID  . ipratropium-albuterol  3 mL Nebulization TID  . loratadine  10 mg Oral Daily  . pantoprazole (PROTONIX) IV  40 mg Intravenous Q12H  . senna-docusate  1 tablet Oral BID  . sodium chloride (PF)       Continuous Infusions: . sodium chloride 100 mL/hr at 04/14/18 1215  . azithromycin Stopped (04/13/18 2344)  . cefTRIAXone (ROCEPHIN)  IV 400 mL/hr at 04/13/18 2239  . dextrose 5 % and 0.45 % NaCl with KCl 10 mEq/L 75 mL/hr at 04/14/18 0300     LOS: 1 day    Time spent: 40 minutes    Irine Seal, MD [facility]  If 7PM-7AM, please contact  night-coverage [email or web address] Password [facility] 04/14/2018, 12:28 PM

## 2018-04-14 NOTE — Evaluation (Signed)
Clinical/Bedside Swallow Evaluation Patient Details  Name: Morgan Morrow MRN: [id number] Date of Birth: [birth year]  Today's Date: 04/14/2018 Time: SLP Start Time (ACUTE ONLY): 4098 SLP Stop Time (ACUTE ONLY): 1525 SLP Time Calculation (min) (ACUTE ONLY): 20 min  Past Surgical History: History reviewed. No pertinent surgical history. HPI:      Assessment / Plan / Recommendation Clinical Impression  Clinical swallowing evaluation was completed using thin liquids via spoon and cup, nectar thick liquids via spoon and cup, honey thick liquids via spoon and cup, pureed material and dry solids.  The patient presented with a possible pharyngeal dysphagia.  The patient did endorse issues with liquids marked by coughing.  She also reported issues with food feeling like it would not go down or that it was stuck particularly when she would lay down.  She has regurgitated food at times.  Cranial nerve exam was completed and unremarkable.  Lingual, labial, facial and jaw range of motion and strength were adequate.  The patient was noted to have immediate cough response from spoon and self fed cup boluses of thin and nectar thick liquids.  This was eliminated with the use of honey thick liquids.  Recommend a regular diet with honey thick liquids pending MBS to determine current swallowing physiology.  SLP Visit Diagnosis: Dysphagia, unspecified (R13.10)    Aspiration Risk  Mild aspiration risk    Diet Recommendation   Regular with honey thick liquids  Medication Administration: Crushed with puree    Other  Recommendations Oral Care Recommendations: Oral care QID Other Recommendations: Order thickener from pharmacy;Prohibited food (jello, ice cream, thin soups)   Follow up Recommendations Other (comment)(TBD)      Frequency and Duration min 2x/week  2 weeks       Prognosis Prognosis for Safe Diet Advancement: Fair      Swallow Study   General Date of Onset: 04/13/18 Type of Study: Bedside  Swallow Evaluation Previous Swallow Assessment: None noted at [facility].   Diet Prior to this Study: Regular;Thin liquids Temperature Spikes Noted: No Respiratory Status: Room air History of Recent Intubation: No Behavior/Cognition: Alert;Cooperative Oral Cavity Assessment: Within Functional Limits Oral Care Completed by SLP: No Vision: Functional for self-feeding Self-Feeding Abilities: Able to feed self Patient Positioning: Upright in bed Baseline Vocal Quality: Low vocal intensity Volitional Cough: Strong Volitional Swallow: Able to elicit    Oral/Motor/Sensory Function Overall Oral Motor/Sensory Function: Within functional limits   Ice Chips Ice chips: Not tested   Thin Liquid Thin Liquid: Impaired Presentation: Spoon;Cup;Self Fed Pharyngeal  Phase Impairments: Suspected delayed Swallow;Cough - Immediate    Nectar Thick Nectar Thick Liquid: Impaired Presentation: Cup;Spoon;Self Fed Pharyngeal Phase Impairments: Suspected delayed Swallow;Cough - Immediate   Honey Thick Honey Thick Liquid: Impaired Presentation: Cup;Self fed Pharyngeal Phase Impairments: Suspected delayed Swallow   Puree Puree: Impaired Presentation: Spoon Pharyngeal Phase Impairments: Suspected delayed Swallow   Solid     Solid: Impaired Presentation: Self Fed Pharyngeal Phase Impairments: Suspected delayed [place], MA, CCC-SLP Acute Rehab SLP [phone]  Lamar Sprinkles 04/14/2018,3:41 PM

## 2018-04-14 NOTE — Consult Note (Signed)
[place]  [phone] Fax:(336) [phone]     ID: SHARIKA MOSQUERA DOB: Jul 30, 1940  MR#: [id number]  WEX#:[id number]  Patient Care Team: Leighton Ruff, MD as PCP - General (Family Medicine) Deliliah Spranger, [name], MD as Consulting Physician (Oncology) Gatha Mayer, MD as Consulting Physician (Gastroenterology) Chauncey Cruel, MD OTHER MD:  CHIEF COMPLAINT: Stage IV breast cancer  CURRENT TREATMENT: Staging studies pending   HISTORY OF CURRENT ILLNESS: [name] has had progressive weakness and weight loss for several months.  More recently she developed persistent diarrhea which brought her to the emergency room 04/13/2018.  Chest x-ray was obtained suggesting a possible right middle lobe pneumonia.  CT of the abdomen and pelvis was obtained and it showed possible lung metastases, possible liver metastases, and multiple lytic bone lesions.  I was consulted for further evaluation.  I met with the patient in her room and in course of exam it became obvious that she had a right-sided breast cancer.      [name] tells me she has not had a mammogram for over 10 years.  For the past 5 years or so she has had changes in her right breast which she has concealed from her family and apparently also from her physicians.  The patient's subsequent history is as detailed below.  INTERVAL HISTORY: I met with the patient in her hospital room 04/14/2018.  Her daughter Tonyia Marschall was present during the visit  REVIEW OF SYSTEMS: The patient tells me her baseline weight is 121 pounds.  She is currently 100 pounds.  She has lost this weight over the past year or so.  She has also felt increasingly weak, and of course that became much worse when she became dehydrated secondary to her persistent diarrhea.  The bowel movements were mostly water, several times a day, not bloody.  She denies unusual headaches, visual changes, nausea, or vomiting.  She has a history of  chronic bronchitis felt to be secondary to secondhand smoke from her ex-husband smoking.  She denies pain at present.  A detailed review of systems today was otherwise noncontributory  PAST MEDICAL HISTORY: Past Medical History:  Diagnosis Date  . Allergy   . Hypertension   Chronic bronchitis  PAST SURGICAL HISTORY: History reviewed. No pertinent surgical history.  Status post remote hip surgery bilaterally  FAMILY HISTORY Family History  Problem Relation Age of Onset  . Hypertension Mother   . Depression Mother   . Hypertension Father   . Arthritis Father   The patient's father died from a stroke at age 47.  The patient's mother died at age 2.  The patient had 2 sisters, one brother.  She is not aware of any history of cancer in the family  GYNECOLOGIC HISTORY:  No LMP recorded. Patient is postmenopausal. Menarche: 78 years old Age at first live birth: 78 years old GX P 4 LMP 81 Contraceptive N/A HRT yes approximately 4 years  Hysterectomy?  No Salpingo-oophorectomy?  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  She is divorced lives by herself, with no pets.  Her daughter Adalberto Ill lives in [place] where she works as Mudlogger of the contract section of the [employer] of revenue.  The patient's son Chip Boer lives in [place] and works as a Geophysicist/field seismologist.  The patient has a daughter in [place] and a daughter in [place].  She has 5 grandchildren and 5 great-grandchildren.  She does not belong to a church, Glass blower/designer or mosque    ADVANCED DIRECTIVES: Not in place.  We will try to get a healthcare power of attorney accomplished this admission.   HEALTH MAINTENANCE: Social History   Tobacco Use  . Smoking status: Never Smoker  Substance Use Topics  . Alcohol use: No  . Drug use: No     Colonoscopy: Age 9  PAP:  Bone density:   No Known Allergies  Current Facility-Administered Medications    Medication Dose Route Frequency Provider Last Rate Last Dose  . 0.9 %  sodium chloride infusion   Intravenous Continuous Eugenie Filler, MD   Stopped at 04/13/18 2243  . azithromycin (ZITHROMAX) 500 mg in sodium chloride 0.9 % 250 mL IVPB  500 mg Intravenous Q24H Wouk, Ailene Rud, MD   Stopped at 04/13/18 2344  . cefTRIAXone (ROCEPHIN) 1 g in sodium chloride 0.9 % 100 mL IVPB  1 g Intravenous Q24H Gwynne Edinger, MD 400 mL/hr at 04/13/18 2239    . dextrose 5 % and 0.45 % NaCl with KCl 10 mEq/L infusion   Intravenous Continuous Gwynne Edinger, MD 75 mL/hr at 04/14/18 0300    . fluticasone (FLONASE) 50 MCG/ACT nasal spray 2 spray  2 spray Each Nare Daily Eugenie Filler, MD      . guaiFENesin (MUCINEX) 12 hr tablet 1,200 mg  1,200 mg Oral BID Eugenie Filler, MD      . ipratropium-albuterol (DUONEB) 0.5-2.5 (3) MG/3ML nebulizer solution 3 mL  3 mL Nebulization TID Eugenie Filler, MD      . loratadine (CLARITIN) tablet 10 mg  10 mg Oral Daily Eugenie Filler, MD      . pantoprazole (PROTONIX) EC tablet 40 mg  40 mg Oral Daily Eugenie Filler, MD        OBJECTIVE: Older white woman examined in bed  Vitals:   04/13/18 2137 04/14/18 0507  BP: 98/67 (!) 96/57  Pulse: 76 72  Resp: 19 20  Temp: 97.6 F (36.4 C) 97.9 F (36.6 C)  SpO2: 98% 95%     Body mass index is 20.2 kg/m.   Wt Readings from Last 3 Encounters:  04/13/18 100 lb (45.4 kg)  01/02/14 129 lb 6.6 oz (58.7 kg)  11/11/13 130 lb 3.2 oz (59.1 kg)      ECOG FS:2 - Symptomatic, <50% confined to bed  Lymphatic: No cervical or supraclavicular adenopathy Lungs no rales or rhonchi, auscultated anterolaterally Heart regular rate and rhythm Abd soft, nontender, positive bowel sounds Neuro: non-focal, well-oriented, appropriate affect Breasts: The right breast is imaged above.  The left breast is unremarkable.    LAB RESULTS:  CMP     Component Value Date/Time   NA 138 04/14/2018 0656   K 3.6  04/14/2018 0656   CL 102 04/14/2018 0656   CO2 26 04/14/2018 0656   GLUCOSE 82 04/14/2018 0656   BUN 40 (H) 04/14/2018 0656   CREATININE 0.92 04/14/2018 0656   CALCIUM 8.9 04/14/2018 0656   PROT 5.3 (L) 04/14/2018 0656   ALBUMIN 2.5 (L) 04/14/2018 0656   AST 111 (H) 04/14/2018 0656   ALT 27 04/14/2018 0656   ALKPHOS 221 (H) 04/14/2018 0656   BILITOT 0.5 04/14/2018 0656   GFRNONAA 59 (L) 04/14/2018 0656   GFRAA >60 04/14/2018 0656    No results found for: TOTALPROTELP, ALBUMINELP, A1GS, A2GS, BETS, BETA2SER, GAMS, MSPIKE, SPEI  No results found for: KPAFRELGTCHN, LAMBDASER, KAPLAMBRATIO  Lab  Results  Component Value Date   WBC 4.2 04/14/2018   NEUTROABS 4.2 04/13/2018   HGB 8.9 (L) 04/14/2018   HCT 28.1 (L) 04/14/2018   MCV 96.9 04/14/2018   PLT 186 04/14/2018    _0 @  No results found for: LABCA2  No components found for: QBHALP379  Recent Labs  Lab 04/14/18 0656  INR 0.97    No results found for: LABCA2  No results found for: KWI097  No results found for: DZH299  No results found for: MEQ683  No results found for: CA2729  No components found for: HGQUANT  No results found for: CEA1 / No results found for: CEA1   No results found for: AFPTUMOR  No results found for: CHROMOGRNA  No results found for: PSA1  Admission on 04/13/2018  Component Date Value Ref Range Status  . Sodium 04/13/2018 141  135 - 145 mmol/L Final  . Potassium 04/13/2018 3.2* 3.5 - 5.1 mmol/L Final  . Chloride 04/13/2018 101  98 - 111 mmol/L Final  . CO2 04/13/2018 28  22 - 32 mmol/L Final  . Glucose, Bld 04/13/2018 132* 70 - 99 mg/dL Final  . BUN 04/13/2018 51* 8 - 23 mg/dL Final  . Creatinine, Ser 04/13/2018 1.09* 0.44 - 1.00 mg/dL Final  . Calcium 04/13/2018 9.7  8.9 - 10.3 mg/dL Final  . Total Protein 04/13/2018 6.6  6.5 - 8.1 g/dL Final  . Albumin 04/13/2018 3.2* 3.5 - 5.0 g/dL Final  . AST 04/13/2018 103* 15 - 41 U/L Final  . ALT 04/13/2018 30  0 - 44 U/L  Final  . Alkaline Phosphatase 04/13/2018 249* 38 - 126 U/L Final  . Total Bilirubin 04/13/2018 0.7  0.3 - 1.2 mg/dL Final  . GFR calc non Af Amer 04/13/2018 48* >60 mL/min Final  . GFR calc Af Amer 04/13/2018 56* >60 mL/min Final  . Anion gap 04/13/2018 12  5 - 15 Final   Performed at [facility], [place] [address], [place], [place] [address]  . Lipase 04/13/2018 321* 11 - 51 U/L Final   Performed at [facility], [place] [address] [place], [place] [address]  . Troponin I 04/13/2018 <0.03  <0.03 ng/mL Final   Performed at [facility], [place] [address] [place], [place] [address]  . WBC 04/13/2018 5.2  4.0 - 10.5 K/uL Final  . RBC 04/13/2018 3.50* 3.87 - 5.11 MIL/uL Final  . Hemoglobin 04/13/2018 10.7* 12.0 - 15.0 g/dL Final  . HCT 04/13/2018 33.5* 36.0 - 46.0 % Final  . MCV 04/13/2018 95.7  80.0 - 100.0 fL Final  . MCH 04/13/2018 30.6  26.0 - 34.0 pg Final  . MCHC 04/13/2018 31.9  30.0 - 36.0 g/dL Final  . RDW 04/13/2018 13.8  11.5 - 15.5 % Final  . Platelets 04/13/2018 216  150 - 400 K/uL Final  . nRBC 04/13/2018 0.0  0.0 - 0.2 % Final  . Neutrophils Relative % 04/13/2018 81  % Final  . Neutro Abs 04/13/2018 4.2  1.7 - 7.7 K/uL Final  . Lymphocytes Relative 04/13/2018 11  % Final  . Lymphs Abs 04/13/2018 0.6* 0.7 - 4.0 K/uL Final  . Monocytes Relative 04/13/2018 7  % Final  . Monocytes Absolute 04/13/2018 0.4  0.1 - 1.0 K/uL Final  . Eosinophils Relative 04/13/2018 0  % Final  . Eosinophils Absolute 04/13/2018 0.0  0.0 - 0.5 K/uL Final  . Basophils Relative 04/13/2018 0  % Final  . Basophils Absolute 04/13/2018  0.0  0.0 - 0.1 K/uL Final  . Immature Granulocytes 04/13/2018 1  % Final  . Abs Immature Granulocytes 04/13/2018 0.04  0.00 - 0.07 K/uL Final   Performed at [facility], [place] [address] [place], [place] [address]  . Fecal Occult Bld 04/13/2018 POSITIVE* NEGATIVE Final  . Lactic Acid, Venous 04/13/2018  1.6  0.5 - 1.9 mmol/L Final   Performed at [facility], [place] [address] [place], [place] [address]  . Lactic Acid, Venous 04/13/2018 1.5  0.5 - 1.9 mmol/L Final   Performed at [place] [address] [place], [place] [address]  . Magnesium 04/13/2018 2.3  1.7 - 2.4 mg/dL Final   Performed at [place] [address], [place], [place] [address]  . Specimen Description 04/13/2018    Final                   Value:BLOOD RIGHT HAND Performed at [facility], [place] [address] [place], [place] [address]   . Special Requests 04/13/2018    Final                   Value:BOTTLES DRAWN AEROBIC ONLY Blood Culture adequate volume Performed at [facility], [place] [address] [place], [place] [address]   . Culture 04/13/2018 PENDING   Incomplete  . Report Status 04/13/2018 PENDING   Incomplete  . Lipase 04/14/2018 190* 11 - 51 U/L Final   Performed at [facility], [place] [address] [place], [place] [address]  . Sodium 04/14/2018 138  135 - 145 mmol/L Final  . Potassium 04/14/2018 3.6  3.5 - 5.1 mmol/L Final  . Chloride 04/14/2018 102  98 - 111 mmol/L Final  . CO2 04/14/2018 26  22 - 32 mmol/L Final  . Glucose, Bld 04/14/2018 82  70 - 99 mg/dL Final  . BUN 04/14/2018 40* 8 - 23 mg/dL Final  . Creatinine, Ser 04/14/2018 0.92  0.44 - 1.00 mg/dL Final  . Calcium 04/14/2018 8.9  8.9 - 10.3 mg/dL Final  . Total Protein 04/14/2018 5.3* 6.5 - 8.1 g/dL Final  . Albumin 04/14/2018 2.5* 3.5 - 5.0 g/dL Final  . AST 04/14/2018 111* 15 - 41 U/L Final  . ALT 04/14/2018 27  0 - 44 U/L Final  . Alkaline Phosphatase 04/14/2018 221* 38 - 126 U/L Final  . Total Bilirubin 04/14/2018 0.5  0.3 - 1.2 mg/dL Final  . GFR calc non Af Amer 04/14/2018 59* >60 mL/min Final  . GFR calc Af Amer 04/14/2018 >60  >60 mL/min Final  . Anion gap 04/14/2018 10  5 - 15 Final   Performed at [facility], [place]  [address] [place], [place] [address]  . WBC 04/14/2018 4.2  4.0 - 10.5 K/uL Final  . RBC 04/14/2018 2.90* 3.87 - 5.11 MIL/uL Final  . Hemoglobin 04/14/2018 8.9* 12.0 - 15.0 g/dL Final  . HCT 04/14/2018 28.1* 36.0 - 46.0 % Final  . MCV 04/14/2018 96.9  80.0 - 100.0 fL Final  . MCH 04/14/2018 30.7  26.0 - 34.0 pg Final  . MCHC 04/14/2018 31.7  30.0 - 36.0 g/dL Final  . RDW 04/14/2018 13.9  11.5 - 15.5 % Final  . Platelets 04/14/2018 186  150 - 400 K/uL Final  . nRBC 04/14/2018 0.0  0.0 - 0.2 % Final   Performed at [facility], [place] [address], [place], [place] [address]  .  Prothrombin Time 04/14/2018 12.8  11.4 - 15.2 seconds Final  . INR 04/14/2018 0.97   Final   Performed at [facility], [place] [address] [place], [place] [address]    (this displays the last labs from the last 3 days)  No results found for: TOTALPROTELP, ALBUMINELP, A1GS, A2GS, BETS, BETA2SER, GAMS, MSPIKE, SPEI (this displays SPEP labs)  No results found for: KPAFRELGTCHN, LAMBDASER, KAPLAMBRATIO (kappa/lambda light chains)  No results found for: HGBA, HGBA2QUANT, HGBFQUANT, HGBSQUAN (Hemoglobinopathy evaluation)   No results found for: LDH  No results found for: IRON, TIBC, IRONPCTSAT (Iron and TIBC)  No results found for: FERRITIN  Urinalysis    Component Value Date/Time   BILIRUBINUR neg 01/02/2014 1034   PROTEINUR neg 01/02/2014 1034   UROBILINOGEN 0.2 01/02/2014 1034   NITRITE neg 01/02/2014 1034   LEUKOCYTESUR Negative 01/02/2014 1034     STUDIES: Dg Chest 2 View  Result Date: 04/13/2018 CLINICAL DATA:  Per EMS, patient from home, c/o diarrhea worsening x2 days after taking a laxative. Denies N/V and abdominal pain. Weakness, short of breath, never a smoker, no other chest complaints EXAM: CHEST - 2 VIEW COMPARISON:  11/02/2010 FINDINGS: Patchy airspace opacities in the right middle lobe, new since previous. Left lung clear. Heart size and mediastinal contours  are within normal limits. No effusion. Visualized bones unremarkable. IMPRESSION: Patchy right middle lobe airspace disease suggesting pneumonia. Electronically Signed   By: Lucrezia Europe M.D.   On: 04/13/2018 17:33   Ct Abdomen Pelvis W Contrast  Result Date: 04/13/2018 CLINICAL DATA:  Worsening diarrhea x2 days after taking laxatives. EXAM: CT ABDOMEN AND PELVIS WITH CONTRAST TECHNIQUE: Multidetector CT imaging of the abdomen and pelvis was performed using the standard protocol following bolus administration of intravenous contrast. CONTRAST:  73m ISOVUE-300 IOPAMIDOL (ISOVUE-300) INJECTION 61%, 3104mOMNIPAQUE IOHEXOL 300 MG/ML SOLN COMPARISON:  None. FINDINGS: Lower chest: Small hiatal hernia. Mild thickening of what may represent the distal esophagus raises possibility of esophagitis versus partial volume averaging of the uppermost aspect of the hiatal hernia. Top normal heart size without pericardial effusion. Subpleural ill-defined pulmonary opacities that may represent postinfectious or inflammatory change versus atelectasis are identified. Some however appear more nodular raising concern for pulmonary nodules, example a 3 mm right lower lobe nodule on series 7/13 with smaller nodular densities seen in the right lower lobe. Given abnormality of the axial and appendicular skeleton concerning for diffuse osseous metastasis or myeloma, these nodular opacities raise concern for pulmonary metastasis as well. Hepatobiliary: Ill-defined hypodensities in the right hepatic lobe are identified, the largest measuring up to 14 mm with peripheral puddling of contrast suggestive of a hemangioma is noted. Additional 11 mm hypodensity in the right hepatic lobe also appears to demonstrate similar enhancement pattern on repeat delayed imaging and would be more in keeping with a hemangioma. Further caudad in the right hepatic lobe is an ill-defined 12 mm hypodensity that appears to have filled in on repeat delayed imaging.  Given pulmonary and osseous findings however, the possibility of hepatic metastasis is not excluded. MRI with liver protocol may help for further correlation. No biliary dilatation. Numerous gallstones are seen within the gallbladder. Pancreas: Normal Spleen: Normal size spleen with ill-defined hypodensity medially measuring 12 mm, nonspecific in etiology. Adrenals/Urinary Tract: Normal bilateral adrenal glands and kidneys. No obstructive uropathy. Urinary bladder is physiologically distended without focal mural thickening or calculus. Stomach/Bowel: Large stool ball in the rectum with circumferential anorectal thickening about this finding raises concern for stercoral  proctocolitis. No bowel obstruction. The stomach and small intestine are nonacute. No evidence of acute appendicitis. Vascular/Lymphatic: Mild aortoiliac and branch vessel atherosclerosis without aneurysm. No adenopathy. Reproductive: Uterus and bilateral adnexa are unremarkable. Other: Mild soft tissue anasarca.  No ascites or free air. 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 Differential possibilities may include myeloma or diffuse osteo lytic metastasis. Moderate compression deformity of L1 with 50% height loss is noted as well as mild superior endplate compression of T11. No retropulsion is noted. IMPRESSION: 1. Ill-defined pulmonary opacities at the lung bases some which are nodular in appearance raising concern for possible metastatic disease. Concomitant in postinfectious or inflammatory abnormalities are not excluded. 2. 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 Differential possibilities may include osteolytic metastasis of unknown primary or myeloma among some considerations. 3. Three hypodense lesions of the liver that appear to demonstrate peripheral puddling of  contrast more likely to represent hemangiomata. However given the pulmonary and osseous findings, metastatic disease is still within differential considerations. 4. Anal rectal circumferential mural thickening surrounding a large stool ball. Stercoral proctocolitis is raised. These results were called by telephone at the time of interpretation on 04/13/2018 at 7:21 pm to Dr. Francine Graven , who verbally acknowledged these results. Electronically Signed   By: Ashley Royalty M.D.   On: 04/13/2018 19:21    ELIGIBLE FOR AVAILABLE RESEARCH PROTOCOL: no  ASSESSMENT: 78 y.o. [place] woman presenting with weight loss and failure to thrive as well as persistent diarrhea, CT abdomen and pelvis 04/13/2018 showing lytic bone lesions, possible liver and lung lesions, and evidence of proctocolitis, with exam showing an obvious right-sided breast cancer as imaged above  (1) metastatic breast cancer: Biopsy is needed to determine phenotype (estrogen receptor status, etc.)  (2) completion staging to include a chest CT scan, possible bone scan, CA-27-29  (3) chaplain service to assist patient in completing healthcare power of attorney documents  (4) once biopsy results available I will discuss specific treatment options with the patient.  All treatments can be done on an outpatient basis  PLAN: I spent approximately 60 minutes face to face with Etola with more than 50% of that time spent in counseling and coordination of care. Specifically we reviewed the biology of the patient's diagnosis and the specifics of her situation.  We first reviewed the fact that cancer is not one disease but more than 100 different diseases and that it is important to keep them separate-- otherwise when friends and relatives discuss their own cancer experiences with [place] confusion can result. Similarly we explained that if breast cancer spreads to the bone or liver, the patient would not have bone cancer or liver cancer, but breast  cancer in the bone and breast cancer in the liver: one cancer in three places-- not 3 different cancers which otherwise would have to be treated in 3 different ways.  She understands that stage IV breast cancer, which I believe is what we are dealing with, is not curable with our current knowledge base. The goal of treatment is control. The strategy of treatment is to do only the minimum necessary to control the growth of the tumor so that the patient can have as normal a life as possible. There is no survival advantage in treating aggressively if treating less aggressively results in tumor control. With this strategy stage IV breast cancer in many cases can function as  a "chronic illness": something that cannot be quite gotten rid of but can be controlled for an indefinite period of time  I discussed the option of no treatment and hospice referral but the patient is interested in proceeding with further evaluation and treatment and that is my strong recommendation  We will need to complete her staging.  At a minimum she will need a CT scan of the chest.  We will need a biopsy which could be done on the breast but ideally would be done on an easily accessible metastatic deposit since that would stage the patient at the same time as providing [place] the necessary information in terms of breast cancer phenotype to allow [place] to treat.  I will obtain a CT of the chest and brain, and a CA-27-29.  Depending on those results we will consider where to obtain a biopsy.  I will also consult the chaplain service to assist the patient on healthcare power of attorney determination.   All the treatments that I do can be done outpatient and so decisions regarding discharge are up to the hospitalist service.  At present the patient appears very debilitated and I do not think it would be safe for her to return home, but I am hopeful she will improve sufficiently with hydration and supportive care that she could avoid SNF  placement  We will follow with you  Chauncey Cruel, MD   04/14/2018 9:48 AM Medical Oncology and Hematology [facility] [place], [place] [address] Tel. [phone]    Fax. [phone]

## 2018-04-14 NOTE — Consult Note (Signed)
Referring Provider: Dr. Grandville Silos Primary Care Physician:  Leighton Ruff, MD Primary Gastroenterologist:  Althia Forts  Reason for Consultation:  Dysphagia; Heme positive stool  HPI: Morgan Morrow is a 78 y.o. female admitted for failure to thrive and found to have metastatic breast cancer who has been having dysphagia, weight loss, and nausea/vomiting. She denies that food/liquids hang up when she swallows it stating that she has occasional belching when she eats and sometimes will have regurgitation of her food with vomiting. She thinks she has lost 20 pounds in the past 2 years. Heme positive. Hgb 8.9 (10.7 on admit yesterday). CT scan showed distal esophageal thickening and anorectal wall thickening with a fecal impaction. Lytic bone lesions and pulmonary mets noted along with possible liver mets.  Past Medical History:  Diagnosis Date  . Allergy   . Hypertension     History reviewed. No pertinent surgical history.  Prior to Admission medications   Medication Sig Start Date End Date Taking? Authorizing Provider  acetaminophen (TYLENOL) 500 MG tablet Take 500 mg by mouth every 6 (six) hours as needed for mild pain.   Yes [provider]  amLODipine (NORVASC) 10 MG tablet TAKE 1 TABLET (10 MG TOTAL) BY MOUTH DAILY. OFFICE VISIT DUE NOW Patient taking differently: Take 10 mg by mouth daily.  10/16/15  Yes Roma Schanz R, DO  ibuprofen (ADVIL,MOTRIN) 200 MG tablet Take 200-400 mg by mouth every 6 (six) hours as needed for mild pain.    Yes [provider]  lisinopril (PRINIVIL,ZESTRIL) 20 MG tablet TAKE ONE TABLET BY MOUTH ONE TIME DAILY Patient taking differently: Take 20 mg by mouth daily.  11/24/14  Yes Roma Schanz R, DO    Scheduled Meds: . bisacodyl  10 mg Rectal Daily  . fluticasone  2 spray Each Nare Daily  . guaiFENesin  1,200 mg Oral BID  . ipratropium-albuterol  3 mL Nebulization BID  . loratadine  10 mg Oral Daily  . pantoprazole (PROTONIX)  IV  40 mg Intravenous Q12H  . senna-docusate  1 tablet Oral BID  . sodium chloride (PF)       Continuous Infusions: . sodium chloride 100 mL/hr at 04/14/18 1215  . azithromycin Stopped (04/13/18 2344)  . cefTRIAXone (ROCEPHIN)  IV 400 mL/hr at 04/13/18 2239  . dextrose 5 % and 0.45 % NaCl with KCl 10 mEq/L 75 mL/hr at 04/14/18 0300   PRN Meds:.RESOURCE THICKENUP CLEAR  Allergies as of 04/13/2018  . (No Known Allergies)    Family History  Problem Relation Age of Onset  . Hypertension Mother   . Depression Mother   . Hypertension Father   . Arthritis Father     Social History   Socioeconomic History  . Marital status: Widowed    Spouse name: Not on file  . Number of children: Not on file  . Years of education: Not on file  . Highest education level: Not on file  Occupational History  . Occupation: retired  Scientific laboratory technician  . Financial resource strain: Not on file  . Food insecurity:    Worry: Not on file    Inability: Not on file  . Transportation needs:    Medical: Not on file    Non-medical: Not on file  Tobacco Use  . Smoking status: Never Smoker  Substance and Sexual Activity  . Alcohol use: No  . Drug use: No  . Sexual activity: Not Currently    Partners: Male  Lifestyle  . Physical activity:  Days per week: Not on file    Minutes per session: Not on file  . Stress: Not on file  Relationships  . Social connections:    Talks on phone: Not on file    Gets together: Not on file    Attends religious service: Not on file    Active member of club or organization: Not on file    Attends meetings of clubs or organizations: Not on file    Relationship status: Not on file  . Intimate partner violence:    Fear of current or ex partner: Not on file    Emotionally abused: Not on file    Physically abused: Not on file    Forced sexual activity: Not on file  Other Topics Concern  . Not on file  Social History Narrative   [facility] SIGNED APPOINTING DAUGHTER,  [place]. OK TO LEAVE MESSAGE ON HOME NUMBER [phone]      Walk daily    Review of Systems: All negative except as stated above in HPI.  Physical Exam: Vital signs: Vitals:   04/14/18 1431 04/14/18 1829  BP: (!) 96/53 105/60  Pulse: 76 81  Resp: 20 20  Temp: 97.8 F (36.6 C) 97.9 F (36.6 C)  SpO2: 98% 96%   Last BM Date: 04/14/18 General:   Lethargic, cachetic, no acute distress Head: normocephalic, atraumatic Eyes: anicteric sclera ENT: oropharynx clear Neck: supple, nontender Lungs:  Clear throughout to auscultation.   No wheezes, crackles, or rhonchi. No acute distress. Heart:  Regular rate and rhythm; no murmurs, clicks, rubs,  or gallops. Abdomen: soft, nontender, nondistended, +BS  Rectal:  Deferred Ext: no edema  GI:  Lab Results: Recent Labs    04/13/18 1740 04/14/18 0656 04/14/18 1701  WBC 5.2 4.2  --   HGB 10.7* 8.9* 9.9*  HCT 33.5* 28.1* 31.8*  PLT 216 186  --    BMET Recent Labs    04/13/18 1740 04/14/18 0656  NA 141 138  K 3.2* 3.6  CL 101 102  CO2 28 26  GLUCOSE 132* 82  BUN 51* 40*  CREATININE 1.09* 0.92  CALCIUM 9.7 8.9   LFT Recent Labs    04/14/18 0656  PROT 5.3*  ALBUMIN 2.5*  AST 111*  ALT 27  ALKPHOS 221*  BILITOT 0.5   PT/INR Recent Labs    04/14/18 0656  LABPROT 12.8  INR 0.97     Studies/Results: Dg Chest 2 View  Result Date: 04/13/2018 CLINICAL DATA:  Per EMS, patient from home, c/o diarrhea worsening x2 days after taking a laxative. Denies N/V and abdominal pain. Weakness, short of breath, never a smoker, no other chest complaints EXAM: CHEST - 2 VIEW COMPARISON:  11/02/2010 FINDINGS: Patchy airspace opacities in the right middle lobe, new since previous. Left lung clear. Heart size and mediastinal contours are within normal limits. No effusion. Visualized bones unremarkable. IMPRESSION: Patchy right middle lobe airspace disease suggesting pneumonia. Electronically Signed   By: Lucrezia Europe M.D.   On:  04/13/2018 17:33   Ct Head W & Wo Contrast  Result Date: 04/14/2018 CLINICAL DATA:  Progressive weakness and weight loss for several months. Diarrhea. Liver and bone metastases. Unknown primary. Bilateral pulmonary nodules. EXAM: CT HEAD WITHOUT AND WITH CONTRAST TECHNIQUE: Contiguous axial images were obtained from the base of the skull through the vertex without and with intravenous contrast CONTRAST:  56m OMNIPAQUE IOHEXOL 300 MG/ML  SOLN COMPARISON:  None. FINDINGS: Brain: Mild atrophy and moderate diffuse white  matter disease is present. There is no pathologic enhancement to suggest metastatic disease to the brain. The ventricles are of proportionate to the degree of atrophy. Remote lacunar infarcts are present in the basal ganglia bilaterally. Brainstem and cerebellum are normal. Vascular: Atherosclerotic calcifications are present within the cavernous internal carotid arteries and at the dural margin the left vertebral artery. There is no hyperdense vessel. Skull: Scattered lytic lesions present throughout the calvarium compatible with known bone metastases. No expansile lesions are present. An indeterminate scalp soft tissue lesion over the left parietal skull near the vertex measures 2.5 x 2.4 x 0.6 cm. There is diffuse infiltration of the calvarium subjacent to this area. Sinuses/Orbits: The paranasal sinuses and mastoid air cells are clear. The globes and orbits are within normal limits. IMPRESSION: 1. No evidence for metastatic disease to brain. 2. Extensive osseous metastases throughout the skull. 3. Focal soft tissue in the high left parietal scalp. This could represent a metastatic deposit. Electronically Signed   By: San Morelle M.D.   On: 04/14/2018 12:45   Ct Chest W Contrast  Result Date: 04/14/2018 CLINICAL DATA:  Right middle lobe pneumonia. EXAM: CT CHEST WITH CONTRAST TECHNIQUE: Multidetector CT imaging of the chest was performed during intravenous contrast administration.  CONTRAST:  11m OMNIPAQUE IOHEXOL 300 MG/ML  SOLN COMPARISON:  Chest x-ray 04/13/2018 FINDINGS: Cardiovascular: Heart size upper normal. No substantial pericardial effusion. Coronary artery calcification is evident. Atherosclerotic calcification is noted in the wall of the thoracic aorta. Ascending thoracic aorta measures 3.8 cm diameter. Mediastinum/Nodes: Soft tissue attenuation is identified in the AP window without a discrete lymph node. Small prevascular lymph nodes are so seated with small lymph nodes in each hilar region. Circumferential wall thickening is noted in the distal esophagus and fluid within the esophageal lumen is compatible with reflux or dysmotility. There is no axillary lymphadenopathy. Lungs/Pleura: The central tracheobronchial airways are patent. Numerous ill-defined pulmonary nodules are scattered through both lungs. Index nodule medial right upper lobe (45/11) measures 5 mm. Index right lower lobe nodule seen posteriorly on 102/11 measures 8 mm. Representative peripheral left lower lobe nodule (83/11) measures 6 mm. Small bilateral pleural effusions evident. Upper Abdomen: 11 mm low-density lesion in the liver, better characterized on yesterday's abdomen CT. Additional small low-density lesions in the liver. Calcified gallstones evident. Small cyst noted right kidney. Musculoskeletal: Widespread bony metastatic involvement is evident. 7.7 x 6.8 x 2.0 cm mass identified in the right breast. IMPRESSION: 1. No evidence for right middle lobe pneumonia. Opacity seen over the right breast on recent chest x-ray likely represents the patient's known large right breast mass. 2. Numerous ill-defined pulmonary nodules scattered through both lungs, highly suspicious for metastatic disease. 3. Widespread bony metastatic involvement. 4. Hypodense liver lesions concerning for metastatic involvement. 5. Small bilateral pleural effusions. 6.  Aortic Atherosclerois (ICD10-170.0) Electronically Signed   By:  EMisty StanleyM.D.   On: 04/14/2018 13:52   Ct Abdomen Pelvis W Contrast  Result Date: 04/13/2018 CLINICAL DATA:  Worsening diarrhea x2 days after taking laxatives. EXAM: CT ABDOMEN AND PELVIS WITH CONTRAST TECHNIQUE: Multidetector CT imaging of the abdomen and pelvis was performed using the standard protocol following bolus administration of intravenous contrast. CONTRAST:  867mISOVUE-300 IOPAMIDOL (ISOVUE-300) INJECTION 61%, 3067mMNIPAQUE IOHEXOL 300 MG/ML SOLN COMPARISON:  None. FINDINGS: Lower chest: Small hiatal hernia. Mild thickening of what may represent the distal esophagus raises possibility of esophagitis versus partial volume averaging of the uppermost aspect of the hiatal hernia. Top normal  heart size without pericardial effusion. Subpleural ill-defined pulmonary opacities that may represent postinfectious or inflammatory change versus atelectasis are identified. Some however appear more nodular raising concern for pulmonary nodules, example a 3 mm right lower lobe nodule on series 7/13 with smaller nodular densities seen in the right lower lobe. Given abnormality of the axial and appendicular skeleton concerning for diffuse osseous metastasis or myeloma, these nodular opacities raise concern for pulmonary metastasis as well. Hepatobiliary: Ill-defined hypodensities in the right hepatic lobe are identified, the largest measuring up to 14 mm with peripheral puddling of contrast suggestive of a hemangioma is noted. Additional 11 mm hypodensity in the right hepatic lobe also appears to demonstrate similar enhancement pattern on repeat delayed imaging and would be more in keeping with a hemangioma. Further caudad in the right hepatic lobe is an ill-defined 12 mm hypodensity that appears to have filled in on repeat delayed imaging. Given pulmonary and osseous findings however, the possibility of hepatic metastasis is not excluded. MRI with liver protocol may help for further correlation. No biliary  dilatation. Numerous gallstones are seen within the gallbladder. Pancreas: Normal Spleen: Normal size spleen with ill-defined hypodensity medially measuring 12 mm, nonspecific in etiology. Adrenals/Urinary Tract: Normal bilateral adrenal glands and kidneys. No obstructive uropathy. Urinary bladder is physiologically distended without focal mural thickening or calculus. Stomach/Bowel: Large stool ball in the rectum with circumferential anorectal thickening about this finding raises concern for stercoral proctocolitis. No bowel obstruction. The stomach and small intestine are nonacute. No evidence of acute appendicitis. Vascular/Lymphatic: Mild aortoiliac and branch vessel atherosclerosis without aneurysm. No adenopathy. Reproductive: Uterus and bilateral adnexa are unremarkable. Other: Mild soft tissue anasarca.  No ascites or free air. 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 Differential possibilities may include myeloma or diffuse osteo lytic metastasis. Moderate compression deformity of L1 with 50% height loss is noted as well as mild superior endplate compression of T11. No retropulsion is noted. IMPRESSION: 1. Ill-defined pulmonary opacities at the lung bases some which are nodular in appearance raising concern for possible metastatic disease. Concomitant in postinfectious or inflammatory abnormalities are not excluded. 2. 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 Differential possibilities may include osteolytic metastasis of unknown primary or myeloma among some considerations. 3. Three hypodense lesions of the liver that appear to demonstrate peripheral puddling of contrast more likely to represent hemangiomata. However given the pulmonary and osseous findings, metastatic disease is still within differential considerations. 4. Anal  rectal circumferential mural thickening surrounding a large stool ball. Stercoral proctocolitis is raised. These results were called by telephone at the time of interpretation on 04/13/2018 at 7:21 pm to Dr. Francine Graven , who verbally acknowledged these results. Electronically Signed   By: Ashley Royalty M.D.   On: 04/13/2018 19:21    Impression/Plan: Failure to thrive with question of dysphagia with imaging concerning for metastatic breast cancer. Mild distal esophageal wall thickening seen on CT. Doubt a primary esophageal malignancy and she is a poor historian regarding her swallowing. Would start with a barium swallow to further evaluate her swallowing and if that shows any concerning esophageal findings, then will do an EGD but barium swallow on Monday. NPO p MN Sunday for the barium swallow. Heme positive stool and anorectal thickening likely due to a fecal impaction and would manage with aggressive laxatives (agree with Dr. Biagio Borg plan). Supportive care. Dr. Alessandra Bevels will f/u after the  barium swallow is done early next week.    LOS: 1 day   Lear Ng  04/14/2018, 8:10 PM  Questions please call [phone]

## 2018-04-15 ENCOUNTER — Inpatient Hospital Stay (HOSPITAL_COMMUNITY): Payer: Medicare Other

## 2018-04-15 DIAGNOSIS — K626 Ulcer of anus and rectum: Secondary | ICD-10-CM

## 2018-04-15 DIAGNOSIS — I340 Nonrheumatic mitral (valve) insufficiency: Secondary | ICD-10-CM

## 2018-04-15 DIAGNOSIS — N63 Unspecified lump in unspecified breast: Secondary | ICD-10-CM

## 2018-04-15 DIAGNOSIS — R220 Localized swelling, mass and lump, head: Secondary | ICD-10-CM

## 2018-04-15 LAB — ECHOCARDIOGRAM COMPLETE
Height: 59 in
Weight: 1600 oz

## 2018-04-15 LAB — COMPREHENSIVE METABOLIC PANEL
ALT: 28 U/L (ref 0–44)
AST: 125 U/L — ABNORMAL HIGH (ref 15–41)
Albumin: 2.4 g/dL — ABNORMAL LOW (ref 3.5–5.0)
Alkaline Phosphatase: 310 U/L — ABNORMAL HIGH (ref 38–126)
Anion gap: 9 (ref 5–15)
BUN: 33 mg/dL — ABNORMAL HIGH (ref 8–23)
CO2: 23 mmol/L (ref 22–32)
Calcium: 8.8 mg/dL — ABNORMAL LOW (ref 8.9–10.3)
Chloride: 110 mmol/L (ref 98–111)
Creatinine, Ser: 0.97 mg/dL (ref 0.44–1.00)
GFR calc Af Amer: >60 mL/min (ref >60)
GFR calc non Af Amer: 56 mL/min — ABNORMAL LOW (ref >60)
Glucose, Bld: 72 mg/dL (ref 70–99)
Potassium: 3.6 mmol/L (ref 3.5–5.1)
Sodium: 142 mmol/L (ref 135–145)
Total Bilirubin: 0.6 mg/dL (ref 0.3–1.2)
Total Protein: 5.4 g/dL — ABNORMAL LOW (ref 6.5–8.1)

## 2018-04-15 LAB — CBC WITH DIFFERENTIAL/PLATELET
Abs Immature Granulocytes: 0.13 10*3/uL — ABNORMAL HIGH (ref 0.00–0.07)
Basophils Absolute: 0 10*3/uL (ref 0.0–0.1)
Basophils Relative: 1 %
Eosinophils Absolute: 0 10*3/uL (ref 0.0–0.5)
Eosinophils Relative: 1 %
HEMATOCRIT: 29.1 % — AB (ref 36.0–46.0)
HEMOGLOBIN: 9 g/dL — AB (ref 12.0–15.0)
Immature Granulocytes: 3 %
LYMPHS ABS: 1 10*3/uL (ref 0.7–4.0)
Lymphocytes Relative: 22 %
MCH: 30 pg (ref 26.0–34.0)
MCHC: 30.9 g/dL (ref 30.0–36.0)
MCV: 97 fL (ref 80.0–100.0)
MONOS PCT: 10 %
Monocytes Absolute: 0.5 10*3/uL (ref 0.1–1.0)
Neutro Abs: 2.8 10*3/uL (ref 1.7–7.7)
Neutrophils Relative %: 63 %
Platelets: 182 10*3/uL (ref 150–400)
RBC: 3 MIL/uL — ABNORMAL LOW (ref 3.87–5.11)
RDW: 14.1 % (ref 11.5–15.5)
WBC: 4.4 10*3/uL (ref 4.0–10.5)
nRBC: 0 % (ref 0.0–0.2)

## 2018-04-15 LAB — LIPASE, BLOOD: Lipase: 74 U/L — ABNORMAL HIGH (ref 11–51)

## 2018-04-15 MED ORDER — POLYETHYLENE GLYCOL 3350 17 G PO PACK
34.0000 g | PACK | Freq: Two times a day (BID) | ORAL | Status: DC
  Administered 2018-04-16 – 2018-04-17 (×3): 34 g via ORAL
  Filled 2018-04-15 (×4): qty 2

## 2018-04-15 MED ORDER — CEFAZOLIN SODIUM-DEXTROSE 2-4 GM/100ML-% IV SOLN
2.0000 g | INTRAVENOUS | Status: DC
  Filled 2018-04-15: qty 100

## 2018-04-15 MED ORDER — CHLORHEXIDINE GLUCONATE CLOTH 2 % EX PADS
6.0000 | MEDICATED_PAD | Freq: Once | CUTANEOUS | Status: AC
  Administered 2018-04-15: 6 via TOPICAL

## 2018-04-15 MED ORDER — IPRATROPIUM-ALBUTEROL 0.5-2.5 (3) MG/3ML IN SOLN: 3.0000 mL | Freq: Four times a day (QID) | RESPIRATORY_TRACT | Status: DC | PRN

## 2018-04-15 MED ORDER — GABAPENTIN 300 MG PO CAPS
300.0000 mg | ORAL_CAPSULE | ORAL | Status: DC
  Filled 2018-04-15: qty 1

## 2018-04-15 MED ORDER — ACETAMINOPHEN 500 MG PO TABS
1000.0000 mg | ORAL_TABLET | ORAL | Status: AC
  Administered 2018-04-16: 500 mg via ORAL
  Filled 2018-04-15: qty 2

## 2018-04-15 MED ORDER — CHLORHEXIDINE GLUCONATE CLOTH 2 % EX PADS
6.0000 | MEDICATED_PAD | Freq: Once | CUTANEOUS | Status: AC
  Administered 2018-04-16: 6 via TOPICAL

## 2018-04-15 NOTE — Progress Notes (Addendum)
PROGRESS NOTE    Morgan Morrow  XQJ:[id number] DOB: [birth year] DOA: 04/13/2018 PCP: Leighton Ruff, MD   Brief Narrative:  Per Dr Peyton Najjar Morgan Morrow is a 78 y.o. female with medical history significant for htn, who presented with weight loss and diarrhea.    History obtained from patient and her daughter.  Daughter reported decline in health over the past year, getting more rapid. Significant weight loss, difficulty swallowing, hoarseness of voice, difficulty with mobility, and increased confusion. Has a PCP, was last seen in approximately November, patient and family unaware of results of that work-up.  Presented to ED mainly because daughter says patient finally relented, which she explains to mean she has been concerned about her mother's health for some time. The only acute change is about 2 weeks of diarrhea, no blood or mucous. No fevers, no recent travel or antibiotics. No abdominal pain. Denies cough or shortness of breath. Denies fevers or recent falls.   ED Course: fecal disempaction, labs, antibiotics, fluids, ct, CXR    Assessment & Plan:   Principal Problem:   Breast mass, probable cancer Active Problems:   Metastatic breast cancer ([place])   Cachexia ([place])   Hematochezia   Community acquired pneumonia   Stercoral ulcer of anus   Constipation, chronic   Osteolytic lesion   FTT (failure to thrive) in adult   Anemia   Murmur, cardiac   Diarrhea   Fecal impaction in rectum ([facility])   Hypokalemia   Scalp mass, probable breast cancer metastasis   Stercoral proctocolitis of rectum  1 probable metastatic breast cancer/FTT Patient notes to have right breast cancer, weight loss, increasing disability.  CT abdomen and pelvis with pulmonary opacities, extensive osteolytic lesions, hypodense lesions in the liver as well as mural thickening in the rectal mucosal and esophageal thickening.  Concerned that primary may be breast cancer.  Patient states last mammogram was  approximately 10 years ago.  Oncology has been consulted and patient seen in consultation by Dr. Jana Hakim.  CT head was negative for any metastatic disease.  CT chest showing liver, lung and bone lesions.  CA 27-29 pending.  Oncology has consulted with general surgery for core needle biopsy.  Per oncology.   2.  Dysphagia Patient with dysphagia and difficulty swallowing however unable to fully describe whether just to solids or liquids.  CT abdomen and pelvis with esophageal thickening.  Patient with significant weight loss, debility, hoarseness of voice.  Patient noted to be FOBT positive and anemic.  Hemoglobin currently at 9.0 from 8.9 from 10.7 on admission.  Likely partly dilutional.  Anemia panel consistent with anemia of chronic disease.  Continue PPI IV every 12 hours.  Patient has been seen in consultation by GI and GI recommending barium swallow tomorrow 04/16/2018 for initial evaluation.  GI following and appreciate input and recommendations.   3.  Fecal impaction/constipation/stercoral ulcer of the anus Fecal disimpaction was attempted in the ED.  Residual stool ball noted on CT.  No signs of perforation.  FOBT positive.  Patient given a fleets enema on day of admission, however no significant results.  Continue daily Dulcolax suppositories.  Soapsuds enema x1.  Patient started on MiraLAX twice daily per general surgery.  Follow.   4.  Anemia Anemia panel consistent with a anemia of chronic disease.  Ferritin elevated at 2513.  B12 levels are 678.  Hemoglobin currently stable at 9.0 from 8.9 from 10.7 on admission.  Partly likely dilutional.  FOBT was positive.  Patient has been seen by GI.  Continue PPI every 12 hours.  Transfusion threshold hemoglobin less than 7.  Follow.   5.  Murmur Noted on examination.  Patient states she has had a murmur for a long time.  2D echo pending.   6.  Diarrhea Likely secondary to stool incontinence secondary to fecal impaction/constipation.  Discontinued  C. difficile PCR and enteric precautions.  Continue Dulcolax suppository daily.  7.  Hypokalemia Magnesium level at 2.3.  Repleted.  8.  Elevated lipase/ Patient denies any epigastric abdominal pain.  Tolerating diet.  Follow.  9.  ??? Community-acquired pneumonia Chest x-ray was suggestive of a pneumonia.  CT findings of abdomen and pelvis most suggestive of metastatic process ongoing.  Patient noted to be hypothermic and hypotensive on admission.  Patient pancultured.  CT chest done was negative for middle lobe pneumonia and findings noted on chest x-ray was felt to be secondary to breast cancer.  Will discontinue IV Rocephin and azithromycin.  Follow.   10.  Hypothermia Likely secondary to metastatic cancer malnutrition.  Patient also noted to have concerns for community-acquired pneumonia on admission.  Off [employer].  Hypothermia seems to have resolved.  CT chest negative for pneumonia.  Will discontinue IV antibiotics.  Follow.   DVT prophylaxis: SCDs Code Status: Full Family Communication: Updated patient.  No family at bedside.  Disposition Plan: Pending work-up.  Likely skilled nursing facility.   Consultants:   Oncology: Dr. Jana Hakim 04/14/2018  Gastroenterology: Dr. Michail Sermon 04/14/2018  Procedures:  .  CT abdomen and pelvis 04/13/2018 Chest x-ray 04/13/2018 CT head 04/14/2018 CT chest 04/14/2018 2D echo 04/15/2018    Antimicrobials:   IV Rocephin 04/13/2018>>>>>>> 04/15/2018  IV azithromycin 04/13/2018>>>>> 04/15/2018   Subjective: Patient laying in bed about to get 2D echo done.  Denies any chest pain or shortness of breath.  Patient still with some loose stool per RN.  Patient denies any overt GI bleed.   Objective: Vitals:   04/14/18 2154 04/15/18 0535 04/15/18 0756 04/15/18 0759  BP:  114/61    Pulse:  83    Resp:  19    Temp:  97.8 F (36.6 C)    TempSrc:  Oral    SpO2: 97% 91% 91% 91%  Weight:      Height:        Intake/Output Summary (Last 24  hours) at 04/15/2018 1133 Last data filed at 04/14/2018 1300 Gross per 24 hour  Intake 240 ml  Output -  Net 240 ml   Filed Weights   04/13/18 1638  Weight: 45.4 kg    Examination:  General exam: Pallor Respiratory system: Lungs clear to auscultation bilaterally anterior lung fields.  Decreased breath sounds in the bases.  Respiratory effort normal. Cardiovascular system: Regular rate and rhythm with 3/6 SEM. No JVD, murmurs, rubs, gallops or clicks. No pedal edema. Gastrointestinal system: Abdomen is soft, nontender, nondistended, positive bowel sounds.  No rebound.  No guarding.  Central nervous system: Alert and oriented. No focal neurological deficits. Extremities: Symmetric 5 x 5 power. Skin: No rashes, lesions or ulcers Psychiatry: Judgement and insight appear normal. Mood & affect appropriate.  Breast; left breast normal.  Right breast with a large cancerous tissue    Data Reviewed: I have personally reviewed following labs and imaging studies  CBC: Recent Labs  Lab 04/13/18 1740 04/14/18 0656 04/14/18 1701 04/15/18 0544  WBC 5.2 4.2  --  4.4  NEUTROABS 4.2  --   --  2.8  HGB 10.7* 8.9* 9.9* 9.0*  HCT 33.5* 28.1* 31.8* 29.1*  MCV 95.7 96.9  --  97.0  PLT 216 186  --  173   Basic Metabolic Panel: Recent Labs  Lab 04/13/18 1740 04/14/18 0656 04/15/18 0544  NA 141 138 142  K 3.2* 3.6 3.6  CL 101 102 110  CO2 28 26 23   GLUCOSE 132* 82 72  BUN 51* 40* 33*  CREATININE 1.09* 0.92 0.97  CALCIUM 9.7 8.9 8.8*  MG 2.3  --   --    GFR: Estimated Creatinine Clearance: 32.1 mL/min (by C-G formula based on SCr of 0.97 mg/dL). Liver Function Tests: Recent Labs  Lab 04/13/18 1740 04/14/18 0656 04/15/18 0544  AST 103* 111* 125*  ALT 30 27 28   ALKPHOS 249* 221* 310*  BILITOT 0.7 0.5 0.6  PROT 6.6 5.3* 5.4*  ALBUMIN 3.2* 2.5* 2.4*   Recent Labs  Lab 04/13/18 1740 04/14/18 0656 04/15/18 0544  LIPASE 321* 190* 74*   No results for input(s): AMMONIA in  the last 168 hours. Coagulation Profile: Recent Labs  Lab 04/14/18 0656  INR 0.97   Cardiac Enzymes: Recent Labs  Lab 04/13/18 1740  TROPONINI <0.03   BNP (last 3 results) No results for input(s): PROBNP in the last 8760 hours. HbA1C: No results for input(s): HGBA1C in the last 72 hours. CBG: No results for input(s): GLUCAP in the last 168 hours. Lipid Profile: No results for input(s): CHOL, HDL, LDLCALC, TRIG, CHOLHDL, LDLDIRECT in the last 72 hours. Thyroid Function Tests: No results for input(s): TSH, T4TOTAL, FREET4, T3FREE, THYROIDAB in the last 72 hours. Anemia Panel: Recent Labs    04/14/18 0955  VITAMINB12 678  FOLATE 18.4  FERRITIN 2,513*  TIBC 311  IRON 57  RETICCTPCT 1.7   Sepsis Labs: Recent Labs  Lab 04/13/18 1818 04/13/18 2316  LATICACIDVEN 1.6 1.5    Recent Results (from the past 240 hour(s))  Culture, blood (routine x 2)     Status: None (Preliminary result)   Collection Time: 04/13/18 10:02 PM  Result Value Ref Range Status   Specimen Description   Final    BLOOD RIGHT WRIST Performed at [facility], [place] [address] [place], [place] [address]    Special Requests   Final    BOTTLES DRAWN AEROBIC AND ANAEROBIC Blood Culture adequate volume Performed at [facility] [address], [place], [place] [address]    Culture   Final    NO GROWTH 1 DAY Performed at [facility], [place] [address] [place], [place] [address]    Report Status PENDING  Incomplete  Culture, blood (routine x 2)     Status: None (Preliminary result)   Collection Time: 04/13/18 10:02 PM  Result Value Ref Range Status   Specimen Description   Final    BLOOD RIGHT HAND Performed at [facility], [place] [address] [place], [place] [address]    Special Requests   Final    BOTTLES DRAWN AEROBIC ONLY Blood Culture adequate volume Performed at [place] [address] [place], [place] [address]     Culture   Final    NO GROWTH 1 DAY Performed at [facility], [place] [address], [place], [place] [address]    Report Status PENDING  Incomplete         Radiology Studies: Dg Chest 2 View  Result Date: 04/13/2018 CLINICAL DATA:  Per EMS, patient from home, c/o diarrhea  worsening x2 days after taking a laxative. Denies N/V and abdominal pain. Weakness, short of breath, never a smoker, no other chest complaints EXAM: CHEST - 2 VIEW COMPARISON:  11/02/2010 FINDINGS: Patchy airspace opacities in the right middle lobe, new since previous. Left lung clear. Heart size and mediastinal contours are within normal limits. No effusion. Visualized bones unremarkable. IMPRESSION: Patchy right middle lobe airspace disease suggesting pneumonia. Electronically Signed   By: Lucrezia Europe M.D.   On: 04/13/2018 17:33   Ct Head W & Wo Contrast  Result Date: 04/14/2018 CLINICAL DATA:  Progressive weakness and weight loss for several months. Diarrhea. Liver and bone metastases. Unknown primary. Bilateral pulmonary nodules. EXAM: CT HEAD WITHOUT AND WITH CONTRAST TECHNIQUE: Contiguous axial images were obtained from the base of the skull through the vertex without and with intravenous contrast CONTRAST:  71m OMNIPAQUE IOHEXOL 300 MG/ML  SOLN COMPARISON:  None. FINDINGS: Brain: Mild atrophy and moderate diffuse white matter disease is present. There is no pathologic enhancement to suggest metastatic disease to the brain. The ventricles are of proportionate to the degree of atrophy. Remote lacunar infarcts are present in the basal ganglia bilaterally. Brainstem and cerebellum are normal. Vascular: Atherosclerotic calcifications are present within the cavernous internal carotid arteries and at the dural margin the left vertebral artery. There is no hyperdense vessel. Skull: Scattered lytic lesions present throughout the calvarium compatible with known bone metastases. No expansile lesions are present. An indeterminate scalp  soft tissue lesion over the left parietal skull near the vertex measures 2.5 x 2.4 x 0.6 cm. There is diffuse infiltration of the calvarium subjacent to this area. Sinuses/Orbits: The paranasal sinuses and mastoid air cells are clear. The globes and orbits are within normal limits. IMPRESSION: 1. No evidence for metastatic disease to brain. 2. Extensive osseous metastases throughout the skull. 3. Focal soft tissue in the high left parietal scalp. This could represent a metastatic deposit. Electronically Signed   By: CSan MorelleM.D.   On: 04/14/2018 12:45   Ct Chest W Contrast  Result Date: 04/14/2018 CLINICAL DATA:  Right middle lobe pneumonia. EXAM: CT CHEST WITH CONTRAST TECHNIQUE: Multidetector CT imaging of the chest was performed during intravenous contrast administration. CONTRAST:  7100mOMNIPAQUE IOHEXOL 300 MG/ML  SOLN COMPARISON:  Chest x-ray 04/13/2018 FINDINGS: Cardiovascular: Heart size upper normal. No substantial pericardial effusion. Coronary artery calcification is evident. Atherosclerotic calcification is noted in the wall of the thoracic aorta. Ascending thoracic aorta measures 3.8 cm diameter. Mediastinum/Nodes: Soft tissue attenuation is identified in the AP window without a discrete lymph node. Small prevascular lymph nodes are so seated with small lymph nodes in each hilar region. Circumferential wall thickening is noted in the distal esophagus and fluid within the esophageal lumen is compatible with reflux or dysmotility. There is no axillary lymphadenopathy. Lungs/Pleura: The central tracheobronchial airways are patent. Numerous ill-defined pulmonary nodules are scattered through both lungs. Index nodule medial right upper lobe (45/11) measures 5 mm. Index right lower lobe nodule seen posteriorly on 102/11 measures 8 mm. Representative peripheral left lower lobe nodule (83/11) measures 6 mm. Small bilateral pleural effusions evident. Upper Abdomen: 11 mm low-density lesion in  the liver, better characterized on yesterday's abdomen CT. Additional small low-density lesions in the liver. Calcified gallstones evident. Small cyst noted right kidney. Musculoskeletal: Widespread bony metastatic involvement is evident. 7.7 x 6.8 x 2.0 cm mass identified in the right breast. IMPRESSION: 1. No evidence for right middle lobe pneumonia. Opacity seen over the right breast  on recent chest x-ray likely represents the patient's known large right breast mass. 2. Numerous ill-defined pulmonary nodules scattered through both lungs, highly suspicious for metastatic disease. 3. Widespread bony metastatic involvement. 4. Hypodense liver lesions concerning for metastatic involvement. 5. Small bilateral pleural effusions. 6.  Aortic Atherosclerois (ICD10-170.0) Electronically Signed   By: Misty Stanley M.D.   On: 04/14/2018 13:52   Ct Abdomen Pelvis W Contrast  Result Date: 04/13/2018 CLINICAL DATA:  Worsening diarrhea x2 days after taking laxatives. EXAM: CT ABDOMEN AND PELVIS WITH CONTRAST TECHNIQUE: Multidetector CT imaging of the abdomen and pelvis was performed using the standard protocol following bolus administration of intravenous contrast. CONTRAST:  41m ISOVUE-300 IOPAMIDOL (ISOVUE-300) INJECTION 61%, 355mOMNIPAQUE IOHEXOL 300 MG/ML SOLN COMPARISON:  None. FINDINGS: Lower chest: Small hiatal hernia. Mild thickening of what may represent the distal esophagus raises possibility of esophagitis versus partial volume averaging of the uppermost aspect of the hiatal hernia. Top normal heart size without pericardial effusion. Subpleural ill-defined pulmonary opacities that may represent postinfectious or inflammatory change versus atelectasis are identified. Some however appear more nodular raising concern for pulmonary nodules, example a 3 mm right lower lobe nodule on series 7/13 with smaller nodular densities seen in the right lower lobe. Given abnormality of the axial and appendicular skeleton  concerning for diffuse osseous metastasis or myeloma, these nodular opacities raise concern for pulmonary metastasis as well. Hepatobiliary: Ill-defined hypodensities in the right hepatic lobe are identified, the largest measuring up to 14 mm with peripheral puddling of contrast suggestive of a hemangioma is noted. Additional 11 mm hypodensity in the right hepatic lobe also appears to demonstrate similar enhancement pattern on repeat delayed imaging and would be more in keeping with a hemangioma. Further caudad in the right hepatic lobe is an ill-defined 12 mm hypodensity that appears to have filled in on repeat delayed imaging. Given pulmonary and osseous findings however, the possibility of hepatic metastasis is not excluded. MRI with liver protocol may help for further correlation. No biliary dilatation. Numerous gallstones are seen within the gallbladder. Pancreas: Normal Spleen: Normal size spleen with ill-defined hypodensity medially measuring 12 mm, nonspecific in etiology. Adrenals/Urinary Tract: Normal bilateral adrenal glands and kidneys. No obstructive uropathy. Urinary bladder is physiologically distended without focal mural thickening or calculus. Stomach/Bowel: Large stool ball in the rectum with circumferential anorectal thickening about this finding raises concern for stercoral proctocolitis. No bowel obstruction. The stomach and small intestine are nonacute. No evidence of acute appendicitis. Vascular/Lymphatic: Mild aortoiliac and branch vessel atherosclerosis without aneurysm. No adenopathy. Reproductive: Uterus and bilateral adnexa are unremarkable. Other: Mild soft tissue anasarca.  No ascites or free air. 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 Differential possibilities may include myeloma or diffuse osteo lytic metastasis. Moderate compression deformity of L1 with 50% height loss is noted as well as mild superior endplate compression of  T11. No retropulsion is noted. IMPRESSION: 1. Ill-defined pulmonary opacities at the lung bases some which are nodular in appearance raising concern for possible metastatic disease. Concomitant in postinfectious or inflammatory abnormalities are not excluded. 2. 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 Differential possibilities may include osteolytic metastasis of unknown primary or myeloma among some considerations. 3. Three hypodense lesions of the liver that appear to demonstrate peripheral puddling of contrast more likely to represent hemangiomata. However given the pulmonary and osseous findings, metastatic disease is still within differential considerations.  4. Anal rectal circumferential mural thickening surrounding a large stool ball. Stercoral proctocolitis is raised. These results were called by telephone at the time of interpretation on 04/13/2018 at 7:21 pm to Dr. Francine Graven , who verbally acknowledged these results. Electronically Signed   By: Ashley Royalty M.D.   On: 04/13/2018 19:21        Scheduled Meds: . bisacodyl  10 mg Rectal Daily  . fluticasone  2 spray Each Nare Daily  . guaiFENesin  1,200 mg Oral BID  . loratadine  10 mg Oral Daily  . pantoprazole (PROTONIX) IV  40 mg Intravenous Q12H  . senna-docusate  1 tablet Oral BID   Continuous Infusions: . sodium chloride 100 mL/hr at 04/14/18 2231  . azithromycin 500 mg (04/14/18 2300)  . cefTRIAXone (ROCEPHIN)  IV 1 g (04/14/18 2000)  . dextrose 5 % and 0.45 % NaCl with KCl 10 mEq/L 75 mL/hr at 04/14/18 0300     LOS: 2 days    Time spent: 40 minutes    Irine Seal, MD [facility]  If 7PM-7AM, please contact night-coverage [email or web address] Password [facility] 04/15/2018, 11:33 AM

## 2018-04-15 NOTE — Consult Note (Signed)
Morgan Morrow  May 28, 1940 [id number]  CARE TEAM:  PCP: Morgan Ruff, MD  Outpatient Care Team: Patient Care Team: Morgan Ruff, MD as PCP - General (Family Medicine) Morrow, [name], MD as Consulting Physician (Oncology)  Inpatient Treatment Team: Treatment Team: Attending Provider: Eugenie Filler, MD; Technician: Morgan Morrow, Morgan Morrow; Rounding Team: Morgan Bushman, MD; Registered Nurse: Morgan Ellison, RN; Consulting Physician: Morgan Corner, MD; Speech Language Pathologist: Morgan Morrow; Consulting Physician: Morgan Pace, Md, MD   This patient is a 78 y.o.female who presents today for surgical evaluation at the request of Dr Morgan Morrow.   Chief complaint / Reason for evaluation: Breast mass with numerous subcutaneous and other lesions very suspicious for metastatic breast cancer.  Failure to thrive.  78 year old elderly woman with unintentional weight loss and failure to thrive.  Difficulty swallowing.  Patient had been resistant to doctors intervention but feeling more weak.  Increasing concerns.  Loose stools.  Finally came to emergency room.  Evaluation concerning for large right breast mass with some rash with subcutaneous, lytic bone, lung, liver lesions.  Very suspicious for metastatic breast cancer.  Oncology consultation made.  Apparently the patient has been aware of changes on her right breast for the past 5 years but she is trying to conceal from her family and physicians.  Request by Dr. Jana Morrow for tissue biopsy.  Nurse and son at bedside.  Patient also with some retained stool in rectum requiring some disimpaction without much relief yet.  Patient tired and not particularly interactive.   Assessment  Morgan Morrow  78 y.o. 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  Numerous lesions with an obvious subcutaneous mass on apex of scalp.  Request for biopsy.  Plan:  Plan tissue biopsy this admission.  Ideally will do tomorrow when pathology is open for possible frozen and further work-up.  She is sensitive & uncomfortable especially in the right breast.  I am skeptical be well to control with local only.  Given the most likely significant vascular state, will do in the operating room with some deep sedation for hemostasis and adequate tissue biopsy.  Most likely punch biopsy of right breast and punch biopsy versus excisional biopsy of scalp mass.  She is already eaten today anyway, making the likelihood of doing procedure today not ideal.  Discussed with Dr. Lucia Morrow who will be assuming care tomorrow.  He agrees.  The pathophysiology of skin & subcutaneous masses was discussed.  Natural history risks without surgery were discussed.  I recommended surgery to remove the mass.  I explained the technique of removal with use of local anesthesia & possible need for more aggressive sedation/anesthesia for patient comfort.    Risks such as bleeding, infection, wound breakdown, heart attack, death, and other risks were discussed.  I noted a good likelihood this will help address the problem.   Possibility that  this will not correct all symptoms was explained. Possibility of regrowth/recurrence of the mass was discussed.  We will work to minimize complications. Questions were answered.  The patient expresses understanding & wishes to proceed with surgery.  Diarrhea most likely overflow incontinence around a large stool  ball.  Requires enema and aggressive bowel regimen.  Already disimpacted in the emergency room.  Defer to primary service.  Suspect she will need aggressive MiraLAX bowel regimen to help clean her out. -VTE prophylaxis- SCDs, etc -mobilize as tolerated to help recovery  20 minutes spent in review, evaluation, examination, counseling, and coordination of care.  More than 50% of that time was spent in counseling.  Morgan Hector, MD, FACS, MASCRS Gastrointestinal and Minimally Invasive Surgery    [address] [place],  [address] [phone] Main / Paging [phone] Fax   04/15/2018      Past Medical History:  Diagnosis Date  . Allergy   . Hypertension     History reviewed. No pertinent surgical history.  Social History   Socioeconomic History  . Marital status: Widowed    Spouse name: Not on file  . Number of children: Not on file  . Years of education: Not on file  . Highest education level: Not on file  Occupational History  . Occupation: retired  Scientific laboratory technician  . Financial resource strain: Not on file  . Food insecurity:    Worry: Not on file    Inability: Not on file  . Transportation needs:    Medical: Not on file    Non-medical: Not on file  Tobacco Use  . Smoking status: Never Smoker  Substance and Sexual Activity  . Alcohol use: No  . Drug use: No  . Sexual activity: Not Currently    Partners: Male  Lifestyle  . Physical activity:    Days per week: Not on file    Minutes per session: Not on file  . Stress: Not on file  Relationships  . Social connections:    Talks on phone: Not on file    Gets together: Not on file    Attends religious service: Not on file    Active member of club or organization: Not on file    Attends meetings of clubs or organizations: Not on file    Relationship status: Not on file  . Intimate partner violence:    Fear of current or ex partner: Not on file    Emotionally abused: Not on file      Physically abused: Not on file    Forced sexual activity: Not on file  Other Topics Concern  . Not on file  Social History Narrative   [facility] FORM SIGNED APPOINTING DAUGHTER, Morgan Morrow. OK TO LEAVE MESSAGE ON HOME NUMBER [phone]      Walk daily    Family History  Problem Relation Age of Onset  . Hypertension Mother   . Depression Mother   . Hypertension Father   . Arthritis Father     Current Facility-Administered Medications  Medication Dose Route Frequency Provider Last Rate Last Dose  . 0.9 %  sodium chloride infusion   Intravenous Continuous Morgan Filler, MD 100 mL/hr at 04/14/18 2231    . azithromycin (ZITHROMAX) 500 mg in sodium chloride 0.9 % 250 mL IVPB  500 mg Intravenous Q24H Gwynne Edinger, MD 250 mL/hr at 04/14/18 2300 500 mg at 04/14/18 2300  . bisacodyl (DULCOLAX) suppository 10 mg  10 mg  Rectal Daily Morgan Filler, MD   10 mg at 04/14/18 1514  . cefTRIAXone (ROCEPHIN) 1 g in sodium chloride 0.9 % 100 mL IVPB  1 g Intravenous Q24H Wouk, Ailene Rud, MD 200 mL/hr at 04/14/18 2000 1 g at 04/14/18 2000  . dextrose 5 % and 0.45 % NaCl with KCl 10 mEq/L infusion   Intravenous Continuous Gwynne Edinger, MD 75 mL/hr at 04/14/18 0300    . fluticasone (FLONASE) 50 MCG/ACT nasal spray 2 spray  2 spray Each Nare Daily Morgan Filler, MD   2 spray at 04/14/18 1514  . guaiFENesin (MUCINEX) 12 hr tablet 1,200 mg  1,200 mg Oral BID Morgan Filler, MD   1,200 mg at 04/14/18 1217  . ipratropium-albuterol (DUONEB) 0.5-2.5 (3) MG/3ML nebulizer solution 3 mL  3 mL Nebulization Q6H PRN Morgan Filler, MD      . loratadine (CLARITIN) tablet 10 mg  10 mg Oral Daily Morgan Filler, MD   10 mg at 04/14/18 1217  . pantoprazole (PROTONIX) injection 40 mg  40 mg Intravenous Q12H Morgan Filler, MD   40 mg at 04/14/18 2315  . [place]   Oral PRN Morgan Filler, MD      . senna-docusate (Senokot-S) tablet 1 tablet  1 tablet Oral BID  Morgan Filler, MD   1 tablet at 04/14/18 1515     No Known Allergies  ROS:   All other systems reviewed & are negative except per HPI or as noted below: Constitutional:  No fevers, chills, sweats.  + fatigue + Weight loss Eyes:  No vision changes, No discharge HENT:  No sore throats, nasal drainage Lymph: No neck swelling, No bruising easily Pulmonary:  No cough, productive sputum CV: No orthopnea, PND  GI:  No personal nor family history of GI/colon cancer, inflammatory bowel disease, irritable bowel syndrome, allergy such as Celiac Sprue, dietary/dairy problems, colitis, ulcers nor gastritis.  No recent sick contacts/gastroenteritis.  No travel outside the country.  No changes in diet. Renal: No UTIs, No hematuria Genital:  No drainage, bleeding, masses Musculoskeletal: No severe joint pain.  Good ROM major joints Skin:  ++sores/ lesions.  Heme/Lymph:  No easy bleeding.  No swollen lymph nodes Neuro: No focal weakness/numbness.  No seizures Psych: No suicidal ideation.  No hallucinations  BP 114/61 (BP Location: Right Arm)   Pulse 83   Temp 97.8 F (36.6 C) (Oral)   Resp 19   Ht 4' 11"  (1.499 m)   Wt 45.4 kg   SpO2 91%   BMI 20.20 kg/m   Physical Exam: General: Pt awake/alert/oriented x4 in  no major acute distress.  Tired.  Cachectic. Eyes: PERRL, normal EOM. Sclera nonicteric Neuro: CN II-XII intact w/o focal sensory/motor deficits. Lymph: No head/neck/groin lymphadenopathy Psych:  No delerium/psychosis/paranoia  HENT: Normocephalic, Mucus membranes moist.  No thrush.  15 mm subcutaneous hard fixed ellipsoid mass at the apex of her scalp.  Not ulcerated.  Correlates with the suspicious lesion on CT of head.  Neck: Supple, No tracheal deviation Chest: No pain.  Good respiratory excursion.  Breast: Small ptotic breast.  Right breast with 7x7cm large erythematous hard rocky mass replacing the lateral half of the breast.  Highly suspicious for cancer.  Nipple  essentially obliterated.  Please see Dr. Virgie Morrow consult note from yesterday for picture.  CV:  Pulses intact.  Regular rhythm Abdomen: Soft, Nondistended.  Nontender.  No incarcerated hernias. Gen:  No inguinal hernias.  No inguinal lymphadenopathy.   Ext:  SCDs BLE.  No significant edema.  No cyanosis Skin: No petechiae / purpurea.  No major sores Musculoskeletal: No severe joint pain.  Good ROM major joints   Results:   Labs: Results for orders placed or performed during the hospital encounter of 04/13/18 (from the past 48 hour(s))  POC occult blood, ED     Status: Abnormal   Collection Time: 04/13/18  5:04 PM  Result Value Ref Range   Fecal Occult Bld POSITIVE (A) NEGATIVE  Comprehensive metabolic panel     Status: Abnormal   Collection Time: 04/13/18  5:40 PM  Result Value Ref Range   Sodium 141 135 - 145 mmol/L   Potassium 3.2 (L) 3.5 - 5.1 mmol/L   Chloride 101 98 - 111 mmol/L   CO2 28 22 - 32 mmol/L   Glucose, Bld 132 (H) 70 - 99 mg/dL   BUN 51 (H) 8 - 23 mg/dL   Creatinine, Ser 1.09 (H) 0.44 - 1.00 mg/dL   Calcium 9.7 8.9 - 10.3 mg/dL   Total Protein 6.6 6.5 - 8.1 g/dL   Albumin 3.2 (L) 3.5 - 5.0 g/dL   AST 103 (H) 15 - 41 U/L   ALT 30 0 - 44 U/L   Alkaline Phosphatase 249 (H) 38 - 126 U/L   Total Bilirubin 0.7 0.3 - 1.2 mg/dL   GFR calc non Af Amer 48 (L) >60 mL/min   GFR calc Af Amer 56 (L) >60 mL/min   Anion gap 12 5 - 15    Comment: Performed at [facility], [place] [address] [place], [place] [address]  Lipase, blood     Status: Abnormal   Collection Time: 04/13/18  5:40 PM  Result Value Ref Range   Lipase 321 (H) 11 - 51 U/L    Comment: Performed at [facility], [place] [address], [place], [place] [address]  Troponin I - Once     Status: None   Collection Time: 04/13/18  5:40 PM  Result Value Ref Range   Troponin I <0.03 <0.03 ng/mL    Comment: Performed at [facility], [place] [address]  [place], [place] [address]  CBC with Differential     Status: Abnormal   Collection Time: 04/13/18  5:40 PM  Result Value Ref Range   WBC 5.2 4.0 - 10.5 K/uL   RBC 3.50 (L) 3.87 - 5.11 MIL/uL   Hemoglobin 10.7 (L) 12.0 - 15.0 g/dL   HCT 33.5 (L) 36.0 - 46.0 %   MCV 95.7 80.0 - 100.0 fL   MCH 30.6 26.0 - 34.0 pg   MCHC 31.9 30.0 - 36.0 g/dL   RDW 13.8 11.5 - 15.5 %   Platelets 216 150 - 400 K/uL   nRBC 0.0 0.0 - 0.2 %   Neutrophils Relative % 81 %   Neutro Abs 4.2 1.7 - 7.7 K/uL   Lymphocytes Relative 11 %   Lymphs Abs 0.6 (L) 0.7 - 4.0 K/uL   Monocytes Relative 7 %   Monocytes Absolute 0.4 0.1 - 1.0 K/uL   Eosinophils Relative 0 %   Eosinophils Absolute 0.0 0.0 - 0.5 K/uL   Basophils Relative 0 %   Basophils Absolute 0.0 0.0 - 0.1 K/uL   Immature Granulocytes 1 %   Abs Immature Granulocytes 0.04 0.00 - 0.07 K/uL    Comment: Performed at [facility], [place] [address], [place], [place] [address]  Magnesium  Status: None   Collection Time: 04/13/18  5:40 PM  Result Value Ref Range   Magnesium 2.3 1.7 - 2.4 mg/dL    Comment: Performed at [facility], [place] [address] [place], [place] [address]  Lactic acid, plasma     Status: None   Collection Time: 04/13/18  6:18 PM  Result Value Ref Range   Lactic Acid, Venous 1.6 0.5 - 1.9 mmol/L    Comment: Performed at [facility], [place] [address], [place], [place] [address]  Culture, blood (routine x 2)     Status: None (Preliminary result)   Collection Time: 04/13/18 10:02 PM  Result Value Ref Range   Specimen Description      BLOOD RIGHT WRIST Performed at [place] [address], [place], [place] [address]    Special Requests      BOTTLES DRAWN AEROBIC AND ANAEROBIC Blood Culture adequate volume Performed at [place] [address], [place], [place] [address]    Culture      NO GROWTH 1 DAY Performed at [place] [address], [place], [place] [address]    Report Status PENDING   Culture, blood (routine x 2)     Status: None (Preliminary result)   Collection Time: 04/13/18 10:02 PM  Result Value Ref Range   Specimen Description      BLOOD RIGHT HAND Performed at [facility], [place] [address], [place], [place] [address]    Special Requests      BOTTLES DRAWN AEROBIC ONLY Blood Culture adequate volume Performed at [place] [address] [place], [place] [address]    Culture      NO GROWTH 1 DAY Performed at [place] [address], [place], [place] [address]    Report Status PENDING   Lactic acid, plasma     Status: None   Collection Time: 04/13/18 11:16 PM  Result Value Ref Range   Lactic Acid, Venous 1.5 0.5 - 1.9 mmol/L    Comment: Performed at [facility], [place] [address] [place], [place] [address]  Lipase, blood     Status: Abnormal   Collection Time: 04/14/18  6:56 AM  Result Value Ref Range   Lipase 190 (H) 11 - 51 U/L    Comment: Performed at [facility], [place] [address] [place], [place] [address]  Comprehensive metabolic panel     Status: Abnormal   Collection Time: 04/14/18  6:56 AM  Result Value Ref Range   Sodium 138 135 - 145 mmol/L   Potassium 3.6 3.5 - 5.1 mmol/L   Chloride 102 98 - 111 mmol/L   CO2 26 22 - 32 mmol/L   Glucose, Bld 82 70 - 99 mg/dL   BUN 40 (H) 8 - 23 mg/dL   Creatinine, Ser 0.92 0.44 - 1.00 mg/dL   Calcium 8.9 8.9 - 10.3 mg/dL   Total Protein 5.3 (L) 6.5 - 8.1 g/dL   Albumin 2.5 (L) 3.5 - 5.0 g/dL   AST 111 (H) 15 - 41 U/L   ALT 27 0 - 44 U/L   Alkaline Phosphatase 221 (H) 38 - 126 U/L   Total Bilirubin 0.5 0.3 - 1.2 mg/dL   GFR calc non Af Amer 59 (L) >60 mL/min   GFR calc Af Amer >60 >60 mL/min   Anion gap 10 5 - 15    Comment: Performed at [facility], [place] [address] [place], [place] [address]  CBC     Status: Abnormal   Collection Time:  04/14/18  6:56 AM  Result Value Ref Range   WBC 4.2 4.0 - 10.5 K/uL   RBC 2.90 (L) 3.87 - 5.11 MIL/uL   Hemoglobin 8.9 (L) 12.0 - 15.0 g/dL   HCT 28.1 (L) 36.0 - 46.0 %   MCV 96.9 80.0 - 100.0 fL   MCH 30.7 26.0 - 34.0 pg   MCHC 31.7 30.0 - 36.0 g/dL   RDW 13.9 11.5 - 15.5 %   Platelets 186 150 - 400 K/uL   nRBC 0.0 0.0 - 0.2 %    Comment: Performed at [facility], [place] [address] [place], [place] [address]  Protime-INR     Status: None   Collection Time: 04/14/18  6:56 AM  Result Value Ref Range   Prothrombin Time 12.8 11.4 - 15.2 seconds   INR 0.97     Comment: Performed at [facility], [place] [address] [place], [place] [address]  Vitamin B12     Status: None   Collection Time: 04/14/18  9:55 AM  Result Value Ref Range   Vitamin B-12 678 180 - 914 pg/mL    Comment: (NOTE) This assay is not validated for testing neonatal or myeloproliferative syndrome specimens for Vitamin B12 levels. Performed at [facility], [place] [address], [place], [place] [address]   Folate     Status: None   Collection Time: 04/14/18  9:55 AM  Result Value Ref Range   Folate 18.4 >5.9 ng/mL    Comment: Performed at [facility], [place] [address] [place], [place] [address]  Iron and TIBC     Status: None   Collection Time: 04/14/18  9:55 AM  Result Value Ref Range   Iron 57 28 - 170 ug/dL   TIBC 311 250 - 450 ug/dL   Saturation Ratios 18 10.4 - 31.8 %   UIBC 254 ug/dL    Comment: Performed at [facility], [place] [address], [place], [place] [address]  Ferritin     Status: Abnormal   Collection Time: 04/14/18  9:55 AM  Result Value Ref Range   Ferritin 2,513 (H) 11 - 307 ng/mL    Comment: Performed at [facility], [place] [address] [place], [place] [address]  Reticulocytes     Status: Abnormal   Collection Time: 04/14/18  9:55 AM  Result Value Ref Range   Retic Ct Pct 1.7 0.4 - 3.1 %    RBC. 3.62 (L) 3.87 - 5.11 MIL/uL   Retic Count, Absolute 61.5 19.0 - 186.0 K/uL   Immature Retic Fract 13.7 2.3 - 15.9 %    Comment: Performed at [facility], [place] [address], [place], [place] [address]  Hemoglobin and hematocrit, blood     Status: Abnormal   Collection Time: 04/14/18  5:01 PM  Result Value Ref Range   Hemoglobin 9.9 (L) 12.0 - 15.0 g/dL   HCT 31.8 (L) 36.0 - 46.0 %    Comment: Performed at [facility], [place] [address] [place], [place] [address]  Comprehensive metabolic panel     Status: Abnormal   Collection Time: 04/15/18  5:44 AM  Result Value Ref Range   Sodium 142 135 - 145 mmol/L   Potassium 3.6 3.5 - 5.1 mmol/L   Chloride 110 98 - 111 mmol/L   CO2 23 22 -  32 mmol/L   Glucose, Bld 72 70 - 99 mg/dL   BUN 33 (H) 8 - 23 mg/dL   Creatinine, Ser 0.97 0.44 - 1.00 mg/dL   Calcium 8.8 (L) 8.9 - 10.3 mg/dL   Total Protein 5.4 (L) 6.5 - 8.1 g/dL   Albumin 2.4 (L) 3.5 - 5.0 g/dL   AST 125 (H) 15 - 41 U/L   ALT 28 0 - 44 U/L   Alkaline Phosphatase 310 (H) 38 - 126 U/L   Total Bilirubin 0.6 0.3 - 1.2 mg/dL   GFR calc non Af Amer 56 (L) >60 mL/min   GFR calc Af Amer >60 >60 mL/min   Anion gap 9 5 - 15    Comment: Performed at [facility], [place] [address], [place], [place] [address]  CBC with Differential/Platelet     Status: Abnormal   Collection Time: 04/15/18  5:44 AM  Result Value Ref Range   WBC 4.4 4.0 - 10.5 K/uL   RBC 3.00 (L) 3.87 - 5.11 MIL/uL   Hemoglobin 9.0 (L) 12.0 - 15.0 g/dL   HCT 29.1 (L) 36.0 - 46.0 %   MCV 97.0 80.0 - 100.0 fL   MCH 30.0 26.0 - 34.0 pg   MCHC 30.9 30.0 - 36.0 g/dL   RDW 14.1 11.5 - 15.5 %   Platelets 182 150 - 400 K/uL   nRBC 0.0 0.0 - 0.2 %   Neutrophils Relative % 63 %   Neutro Abs 2.8 1.7 - 7.7 K/uL   Lymphocytes Relative 22 %   Lymphs Abs 1.0 0.7 - 4.0 K/uL   Monocytes Relative 10 %   Monocytes Absolute 0.5 0.1 - 1.0 K/uL   Eosinophils Relative 1 %   Eosinophils  Absolute 0.0 0.0 - 0.5 K/uL   Basophils Relative 1 %   Basophils Absolute 0.0 0.0 - 0.1 K/uL   Immature Granulocytes 3 %   Abs Immature Granulocytes 0.13 (H) 0.00 - 0.07 K/uL    Comment: Performed at [facility], [place] [address], [place], [place] [address]  Lipase, blood     Status: Abnormal   Collection Time: 04/15/18  5:44 AM  Result Value Ref Range   Lipase 74 (H) 11 - 51 U/L    Comment: Performed at [facility], [place] [address], [place], [place] [address]    Imaging / Studies: Dg Chest 2 View  Result Date: 04/13/2018 CLINICAL DATA:  Per EMS, patient from home, c/o diarrhea worsening x2 days after taking a laxative. Denies N/V and abdominal pain. Weakness, short of breath, never a smoker, no other chest complaints EXAM: CHEST - 2 VIEW COMPARISON:  11/02/2010 FINDINGS: Patchy airspace opacities in the right middle lobe, new since previous. Left lung clear. Heart size and mediastinal contours are within normal limits. No effusion. Visualized bones unremarkable. IMPRESSION: Patchy right middle lobe airspace disease suggesting pneumonia. Electronically Signed   By: Lucrezia Europe M.D.   On: 04/13/2018 17:33   Ct Head W & Wo Contrast  Result Date: 04/14/2018 CLINICAL DATA:  Progressive weakness and weight loss for several months. Diarrhea. Liver and bone metastases. Unknown primary. Bilateral pulmonary nodules. EXAM: CT HEAD WITHOUT AND WITH CONTRAST TECHNIQUE: Contiguous axial images were obtained from the base of the skull through the vertex without and with intravenous contrast CONTRAST:  64m OMNIPAQUE IOHEXOL 300 MG/ML  SOLN COMPARISON:  None. FINDINGS: Brain: Mild atrophy and moderate diffuse white matter disease is present. There is no pathologic enhancement to  suggest metastatic disease to the brain. The ventricles are of proportionate to the degree of atrophy. Remote lacunar infarcts are present in the basal ganglia bilaterally. Brainstem and cerebellum  are normal. Vascular: Atherosclerotic calcifications are present within the cavernous internal carotid arteries and at the dural margin the left vertebral artery. There is no hyperdense vessel. Skull: Scattered lytic lesions present throughout the calvarium compatible with known bone metastases. No expansile lesions are present. An indeterminate scalp soft tissue lesion over the left parietal skull near the vertex measures 2.5 x 2.4 x 0.6 cm. There is diffuse infiltration of the calvarium subjacent to this area. Sinuses/Orbits: The paranasal sinuses and mastoid air cells are clear. The globes and orbits are within normal limits. IMPRESSION: 1. No evidence for metastatic disease to brain. 2. Extensive osseous metastases throughout the skull. 3. Focal soft tissue in the high left parietal scalp. This could represent a metastatic deposit. Electronically Signed   By: [place] Morelle M.D.   On: 04/14/2018 12:45   Ct Chest W Contrast  Result Date: 04/14/2018 CLINICAL DATA:  Right middle lobe pneumonia. EXAM: CT CHEST WITH CONTRAST TECHNIQUE: Multidetector CT imaging of the chest was performed during intravenous contrast administration. CONTRAST:  25m OMNIPAQUE IOHEXOL 300 MG/ML  SOLN COMPARISON:  Chest x-ray 04/13/2018 FINDINGS: Cardiovascular: Heart size upper normal. No substantial pericardial effusion. Coronary artery calcification is evident. Atherosclerotic calcification is noted in the wall of the thoracic aorta. Ascending thoracic aorta measures 3.8 cm diameter. Mediastinum/Nodes: Soft tissue attenuation is identified in the AP window without a discrete lymph node. Small prevascular lymph nodes are so seated with small lymph nodes in each hilar region. Circumferential wall thickening is noted in the distal esophagus and fluid within the esophageal lumen is compatible with reflux or dysmotility. There is no axillary lymphadenopathy. Lungs/Pleura: The central tracheobronchial airways are patent. Numerous  ill-defined pulmonary nodules are scattered through both lungs. Index nodule medial right upper lobe (45/11) measures 5 mm. Index right lower lobe nodule seen posteriorly on 102/11 measures 8 mm. Representative peripheral left lower lobe nodule (83/11) measures 6 mm. Small bilateral pleural effusions evident. Upper Abdomen: 11 mm low-density lesion in the liver, better characterized on yesterday's abdomen CT. Additional small low-density lesions in the liver. Calcified gallstones evident. Small cyst noted right kidney. Musculoskeletal: Widespread bony metastatic involvement is evident. 7.7 x 6.8 x 2.0 cm mass identified in the right breast. IMPRESSION: 1. No evidence for right middle lobe pneumonia. Opacity seen over the right breast on recent chest x-ray likely represents the patient's known large right breast mass. 2. Numerous ill-defined pulmonary nodules scattered through both lungs, highly suspicious for metastatic disease. 3. Widespread bony metastatic involvement. 4. Hypodense liver lesions concerning for metastatic involvement. 5. Small bilateral pleural effusions. 6.  Aortic Atherosclerois (ICD10-170.0) Electronically Signed   By: EMisty StanleyM.D.   On: 04/14/2018 13:52   Ct Abdomen Pelvis W Contrast  Result Date: 04/13/2018 CLINICAL DATA:  Worsening diarrhea x2 days after taking laxatives. EXAM: CT ABDOMEN AND PELVIS WITH CONTRAST TECHNIQUE: Multidetector CT imaging of the abdomen and pelvis was performed using the standard protocol following bolus administration of intravenous contrast. CONTRAST:  844mISOVUE-300 IOPAMIDOL (ISOVUE-300) INJECTION 61%, 3054mMNIPAQUE IOHEXOL 300 MG/ML SOLN COMPARISON:  None. FINDINGS: Lower chest: Small hiatal hernia. Mild thickening of what may represent the distal esophagus raises possibility of esophagitis versus partial volume averaging of the uppermost aspect of the hiatal hernia. Top normal heart size without pericardial effusion. Subpleural ill-defined  pulmonary opacities  that may represent postinfectious or inflammatory change versus atelectasis are identified. Some however appear more nodular raising concern for pulmonary nodules, example a 3 mm right lower lobe nodule on series 7/13 with smaller nodular densities seen in the right lower lobe. Given abnormality of the axial and appendicular skeleton concerning for diffuse osseous metastasis or myeloma, these nodular opacities raise concern for pulmonary metastasis as well. Hepatobiliary: Ill-defined hypodensities in the right hepatic lobe are identified, the largest measuring up to 14 mm with peripheral puddling of contrast suggestive of a hemangioma is noted. Additional 11 mm hypodensity in the right hepatic lobe also appears to demonstrate similar enhancement pattern on repeat delayed imaging and would be more in keeping with a hemangioma. Further caudad in the right hepatic lobe is an ill-defined 12 mm hypodensity that appears to have filled in on repeat delayed imaging. Given pulmonary and osseous findings however, the possibility of hepatic metastasis is not excluded. MRI with liver protocol may help for further correlation. No biliary dilatation. Numerous gallstones are seen within the gallbladder. Pancreas: Normal Spleen: Normal size spleen with ill-defined hypodensity medially measuring 12 mm, nonspecific in etiology. Adrenals/Urinary Tract: Normal bilateral adrenal glands and kidneys. No obstructive uropathy. Urinary bladder is physiologically distended without focal mural thickening or calculus. Stomach/Bowel: Large stool ball in the rectum with circumferential anorectal thickening about this finding raises concern for stercoral proctocolitis. No bowel obstruction. The stomach and small intestine are nonacute. No evidence of acute appendicitis. Vascular/Lymphatic: Mild aortoiliac and branch vessel atherosclerosis without aneurysm. No adenopathy. Reproductive: Uterus and bilateral adnexa are  unremarkable. Other: Mild soft tissue anasarca.  No ascites or free air. 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 Differential possibilities may include myeloma or diffuse osteo lytic metastasis. Moderate compression deformity of L1 with 50% height loss is noted as well as mild superior endplate compression of T11. No retropulsion is noted. IMPRESSION: 1. Ill-defined pulmonary opacities at the lung bases some which are nodular in appearance raising concern for possible metastatic disease. Concomitant in postinfectious or inflammatory abnormalities are not excluded. 2. 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 Differential possibilities may include osteolytic metastasis of unknown primary or myeloma among some considerations. 3. Three hypodense lesions of the liver that appear to demonstrate peripheral puddling of contrast more likely to represent hemangiomata. However given the pulmonary and osseous findings, metastatic disease is still within differential considerations. 4. Anal rectal circumferential mural thickening surrounding a large stool ball. Stercoral proctocolitis is raised. These results were called by telephone at the time of interpretation on 04/13/2018 at 7:21 pm to Dr. Francine Graven , who verbally acknowledged these results. Electronically Signed   By: Ashley Royalty M.D.   On: 04/13/2018 19:21    Medications / Allergies: per chart  Antibiotics: Anti-infectives (From admission, onward)   Start     Dose/Rate Route Frequency Ordered Stop   04/13/18 2230  azithromycin (ZITHROMAX) 500 mg in sodium chloride 0.9 % 250 mL IVPB     500 mg 250 mL/hr over 60 Minutes Intravenous Every 24 hours 04/13/18 2130 04/16/18 2229   04/13/18 2200  doxycycline (VIBRA-TABS) tablet 100 mg  Status:  Discontinued     100 mg Oral Every 12 hours  04/13/18 1928 04/13/18 2130   04/13/18 1930  cefTRIAXone (ROCEPHIN) 1 g in sodium chloride 0.9 % 100 mL IVPB     1 g 200 mL/hr over 30  Minutes Intravenous Every 24 hours 04/13/18 1928 04/20/18 1929        Note: Portions of this report may have been transcribed using voice recognition software. Every effort was made to ensure accuracy; however, inadvertent computerized transcription errors may be present.   Any transcriptional errors that result from this process are unintentional.    Morgan Hector, MD, FACS, MASCRS Gastrointestinal and Minimally Invasive Surgery    [address] [place], [place] [address] [phone] Main / Paging [phone] Fax   04/15/2018

## 2018-04-15 NOTE — Progress Notes (Signed)
  Echocardiogram 2D Echocardiogram has been performed.  Morgan Morrow 04/15/2018, 12:14 PM

## 2018-04-15 NOTE — Progress Notes (Signed)
Morgan Morrow   DOB:06-05-1940   QM#:[id number]   BMW#:[id number]  Subjective:  Morgan Morrow is comfortable in bed, just had a BM which was again all liquid; denies pain, not getting OOB; eating a little better; daughter in room   Objective: older White woman examined in bed Vitals:   04/15/18 0756 04/15/18 0759  BP:    Pulse:    Resp:    Temp:    SpO2: 91% 91%    Body mass index is 20.2 kg/m.  Intake/Output Summary (Last 24 hours) at 04/15/2018 2725 Last data filed at 04/14/2018 1300 Gross per 24 hour  Intake 240 ml  Output -  Net 240 ml     Sclerae unicteric  Lungs no rales or wheezes--auscultated anterolaterally  Heart regular rate and rhythm  Abdomen soft, +BS  Neuro nonfocal  Breast exam: see yesterday's note  Skin: on the upper left skull there is a 2 cm hard bump c/w a metastatic deposit (but can also be benign)  CBG (last 3)  No results for input(s): GLUCAP in the last 72 hours.   Labs:  Lab Results  Component Value Date   WBC 4.4 04/15/2018   HGB 9.0 (L) 04/15/2018   HCT 29.1 (L) 04/15/2018   MCV 97.0 04/15/2018   PLT 182 04/15/2018   NEUTROABS 2.8 04/15/2018    @LASTCHEMISTRY @  Urine Studies No results for input(s): UHGB, CRYS in the last 72 hours.  Invalid input(s): UACOL, UAPR, USPG, UPH, UTP, UGL, UKET, UBIL, UNIT, UROB, ULEU, UEPI, UWBC, URBC, UBAC, CAST, [place], [place]  Basic Metabolic Panel: Recent Labs  Lab 04/13/18 1740 04/14/18 0656 04/15/18 0544  NA 141 138 142  K 3.2* 3.6 3.6  CL 101 102 110  CO2 28 26 23   GLUCOSE 132* 82 72  BUN 51* 40* 33*  CREATININE 1.09* 0.92 0.97  CALCIUM 9.7 8.9 8.8*  MG 2.3  --   --    GFR Estimated Creatinine Clearance: 32.1 mL/min (by C-G formula based on SCr of 0.97 mg/dL). Liver Function Tests: Recent Labs  Lab 04/13/18 1740 04/14/18 0656 04/15/18 0544  AST 103* 111* 125*  ALT 30 27 28   ALKPHOS 249* 221* 310*  BILITOT 0.7 0.5 0.6  PROT 6.6 5.3* 5.4*  ALBUMIN 3.2* 2.5* 2.4*   Recent Labs  Lab  04/13/18 1740 04/14/18 0656 04/15/18 0544  LIPASE 321* 190* 74*   No results for input(s): AMMONIA in the last 168 hours. Coagulation profile Recent Labs  Lab 04/14/18 0656  INR 0.97    CBC: Recent Labs  Lab 04/13/18 1740 04/14/18 0656 04/14/18 1701 04/15/18 0544  WBC 5.2 4.2  --  4.4  NEUTROABS 4.2  --   --  2.8  HGB 10.7* 8.9* 9.9* 9.0*  HCT 33.5* 28.1* 31.8* 29.1*  MCV 95.7 96.9  --  97.0  PLT 216 186  --  182   Cardiac Enzymes: Recent Labs  Lab 04/13/18 1740  TROPONINI <0.03   BNP: Invalid input(s): POCBNP CBG: No results for input(s): GLUCAP in the last 168 hours. D-Dimer No results for input(s): DDIMER in the last 72 hours. Hgb A1c No results for input(s): HGBA1C in the last 72 hours. Lipid Profile No results for input(s): CHOL, HDL, LDLCALC, TRIG, CHOLHDL, LDLDIRECT in the last 72 hours. Thyroid function studies No results for input(s): TSH, T4TOTAL, T3FREE, THYROIDAB in the last 72 hours.  Invalid input(s): FREET3 Anemia work up Recent Labs    04/14/18 0955  VITAMINB12 678  FOLATE 18.4  FERRITIN 2,513*  TIBC 311  IRON 57  RETICCTPCT 1.7   Microbiology Recent Results (from the past 240 hour(s))  Culture, blood (routine x 2)     Status: None (Preliminary result)   Collection Time: 04/13/18 10:02 PM  Result Value Ref Range Status   Specimen Description   Final    BLOOD RIGHT WRIST Performed at [facility] [address] [place], [place] [address]    Special Requests   Final    BOTTLES DRAWN AEROBIC AND ANAEROBIC Blood Culture adequate volume Performed at [facility] [address], [place], [place] [address]    Culture   Final    NO GROWTH 1 DAY Performed at [facility], [place] [address], [place], [place] [address]    Report Status PENDING  Incomplete  Culture, blood (routine x 2)     Status: None (Preliminary result)   Collection Time: 04/13/18 10:02 PM  Result Value Ref Range Status    Specimen Description   Final    BLOOD RIGHT HAND Performed at [facility], [place] [address] [place], [place] [address]    Special Requests   Final    BOTTLES DRAWN AEROBIC ONLY Blood Culture adequate volume Performed at [facility] [address], [place], [place] [address]    Culture   Final    NO GROWTH 1 DAY Performed at [facility], [place] [address] [place],  [address]    Report Status PENDING  Incomplete      Studies:  Dg Chest 2 View  Result Date: 04/13/2018 CLINICAL DATA:  Per EMS, patient from home, c/o diarrhea worsening x2 days after taking a laxative. Denies N/V and abdominal pain. Weakness, short of breath, never a smoker, no other chest complaints EXAM: CHEST - 2 VIEW COMPARISON:  11/02/2010 FINDINGS: Patchy airspace opacities in the right middle lobe, new since previous. Left lung clear. Heart size and mediastinal contours are within normal limits. No effusion. Visualized bones unremarkable. IMPRESSION: Patchy right middle lobe airspace disease suggesting pneumonia. Electronically Signed   By: Lucrezia Europe M.D.   On: 04/13/2018 17:33   Ct Head W & Wo Contrast  Result Date: 04/14/2018 CLINICAL DATA:  Progressive weakness and weight loss for several months. Diarrhea. Liver and bone metastases. Unknown primary. Bilateral pulmonary nodules. EXAM: CT HEAD WITHOUT AND WITH CONTRAST TECHNIQUE: Contiguous axial images were obtained from the base of the skull through the vertex without and with intravenous contrast CONTRAST:  37m OMNIPAQUE IOHEXOL 300 MG/ML  SOLN COMPARISON:  None. FINDINGS: Brain: Mild atrophy and moderate diffuse white matter disease is present. There is no pathologic enhancement to suggest metastatic disease to the brain. The ventricles are of proportionate to the degree of atrophy. Remote lacunar infarcts are present in the basal ganglia bilaterally. Brainstem and cerebellum are normal. Vascular: Atherosclerotic  calcifications are present within the cavernous internal carotid arteries and at the dural margin the left vertebral artery. There is no hyperdense vessel. Skull: Scattered lytic lesions present throughout the calvarium compatible with known bone metastases. No expansile lesions are present. An indeterminate scalp soft tissue lesion over the left parietal skull near the vertex measures 2.5 x 2.4 x 0.6 cm. There is diffuse infiltration of the calvarium subjacent to this area. Sinuses/Orbits: The paranasal sinuses and mastoid air cells are clear. The globes and orbits are within normal limits. IMPRESSION: 1. No evidence for metastatic disease to brain. 2. Extensive osseous metastases throughout  the skull. 3. Focal soft tissue in the high left parietal scalp. This could represent a metastatic deposit. Electronically Signed   By: San Morelle M.D.   On: 04/14/2018 12:45   Ct Chest W Contrast  Result Date: 04/14/2018 CLINICAL DATA:  Right middle lobe pneumonia. EXAM: CT CHEST WITH CONTRAST TECHNIQUE: Multidetector CT imaging of the chest was performed during intravenous contrast administration. CONTRAST:  17m OMNIPAQUE IOHEXOL 300 MG/ML  SOLN COMPARISON:  Chest x-ray 04/13/2018 FINDINGS: Cardiovascular: Heart size upper normal. No substantial pericardial effusion. Coronary artery calcification is evident. Atherosclerotic calcification is noted in the wall of the thoracic aorta. Ascending thoracic aorta measures 3.8 cm diameter. Mediastinum/Nodes: Soft tissue attenuation is identified in the AP window without a discrete lymph node. Small prevascular lymph nodes are so seated with small lymph nodes in each hilar region. Circumferential wall thickening is noted in the distal esophagus and fluid within the esophageal lumen is compatible with reflux or dysmotility. There is no axillary lymphadenopathy. Lungs/Pleura: The central tracheobronchial airways are patent. Numerous ill-defined pulmonary nodules are  scattered through both lungs. Index nodule medial right upper lobe (45/11) measures 5 mm. Index right lower lobe nodule seen posteriorly on 102/11 measures 8 mm. Representative peripheral left lower lobe nodule (83/11) measures 6 mm. Small bilateral pleural effusions evident. Upper Abdomen: 11 mm low-density lesion in the liver, better characterized on yesterday's abdomen CT. Additional small low-density lesions in the liver. Calcified gallstones evident. Small cyst noted right kidney. Musculoskeletal: Widespread bony metastatic involvement is evident. 7.7 x 6.8 x 2.0 cm mass identified in the right breast. IMPRESSION: 1. No evidence for right middle lobe pneumonia. Opacity seen over the right breast on recent chest x-ray likely represents the patient's known large right breast mass. 2. Numerous ill-defined pulmonary nodules scattered through both lungs, highly suspicious for metastatic disease. 3. Widespread bony metastatic involvement. 4. Hypodense liver lesions concerning for metastatic involvement. 5. Small bilateral pleural effusions. 6.  Aortic Atherosclerois (ICD10-170.0) Electronically Signed   By: EMisty StanleyM.D.   On: 04/14/2018 13:52   Ct Abdomen Pelvis W Contrast  Result Date: 04/13/2018 CLINICAL DATA:  Worsening diarrhea x2 days after taking laxatives. EXAM: CT ABDOMEN AND PELVIS WITH CONTRAST TECHNIQUE: Multidetector CT imaging of the abdomen and pelvis was performed using the standard protocol following bolus administration of intravenous contrast. CONTRAST:  861mISOVUE-300 IOPAMIDOL (ISOVUE-300) INJECTION 61%, 3012mMNIPAQUE IOHEXOL 300 MG/ML SOLN COMPARISON:  None. FINDINGS: Lower chest: Small hiatal hernia. Mild thickening of what may represent the distal esophagus raises possibility of esophagitis versus partial volume averaging of the uppermost aspect of the hiatal hernia. Top normal heart size without pericardial effusion. Subpleural ill-defined pulmonary opacities that may represent  postinfectious or inflammatory change versus atelectasis are identified. Some however appear more nodular raising concern for pulmonary nodules, example a 3 mm right lower lobe nodule on series 7/13 with smaller nodular densities seen in the right lower lobe. Given abnormality of the axial and appendicular skeleton concerning for diffuse osseous metastasis or myeloma, these nodular opacities raise concern for pulmonary metastasis as well. Hepatobiliary: Ill-defined hypodensities in the right hepatic lobe are identified, the largest measuring up to 14 mm with peripheral puddling of contrast suggestive of a hemangioma is noted. Additional 11 mm hypodensity in the right hepatic lobe also appears to demonstrate similar enhancement pattern on repeat delayed imaging and would be more in keeping with a hemangioma. Further caudad in the right hepatic lobe is an ill-defined 12 mm hypodensity that appears  to have filled in on repeat delayed imaging. Given pulmonary and osseous findings however, the possibility of hepatic metastasis is not excluded. MRI with liver protocol may help for further correlation. No biliary dilatation. Numerous gallstones are seen within the gallbladder. Pancreas: Normal Spleen: Normal size spleen with ill-defined hypodensity medially measuring 12 mm, nonspecific in etiology. Adrenals/Urinary Tract: Normal bilateral adrenal glands and kidneys. No obstructive uropathy. Urinary bladder is physiologically distended without focal mural thickening or calculus. Stomach/Bowel: Large stool ball in the rectum with circumferential anorectal thickening about this finding raises concern for stercoral proctocolitis. No bowel obstruction. The stomach and small intestine are nonacute. No evidence of acute appendicitis. Vascular/Lymphatic: Mild aortoiliac and branch vessel atherosclerosis without aneurysm. No adenopathy. Reproductive: Uterus and bilateral adnexa are unremarkable. Other: Mild soft tissue anasarca.   No ascites or free air. 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 Differential possibilities may include myeloma or diffuse osteo lytic metastasis. Moderate compression deformity of L1 with 50% height loss is noted as well as mild superior endplate compression of T11. No retropulsion is noted. IMPRESSION: 1. Ill-defined pulmonary opacities at the lung bases some which are nodular in appearance raising concern for possible metastatic disease. Concomitant in postinfectious or inflammatory abnormalities are not excluded. 2. 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 Differential possibilities may include osteolytic metastasis of unknown primary or myeloma among some considerations. 3. Three hypodense lesions of the liver that appear to demonstrate peripheral puddling of contrast more likely to represent hemangiomata. However given the pulmonary and osseous findings, metastatic disease is still within differential considerations. 4. Anal rectal circumferential mural thickening surrounding a large stool ball. Stercoral proctocolitis is raised. These results were called by telephone at the time of interpretation on 04/13/2018 at 7:21 pm to Dr. Francine Graven , who verbally acknowledged these results. Electronically Signed   By: Ashley Royalty M.D.   On: 04/13/2018 19:21    Assessment: 78 y.o. [place] woman presenting with weight loss and failure to thrive as well as persistent diarrhea, CT abdomen and pelvis 04/13/2018 showing lytic bone lesions, possible liver and lung lesions, and evidence of proctocolitis, with exam showing an obvious right-sided breast cancer as imaged above  (1) metastatic breast cancer: Biopsy is needed to determine phenotype (estrogen receptor status, etc.)  (2) staging studies:  (a) CT scans of the C/A/P/brain  01/10-02/2019 show lung, liver and bone lesions, no brain involvement  (3) chaplain service to assist patient in completing healthcare power of attorney documents  (4) once biopsy results available I will discuss specific treatment options with the patient.  All treatments can be done on an outpatient basis     Plan:  I gave Beonka and her daughter copies of the scan results so they can go over them and share them with the rest of the family at their discretion. I contacted Gen. Surgery and requested punch biopsy of the primary site in the Right breast and also core needle biopsy of the scalp lesion noted on head CT and exam. Once that is done I should have relevant information within 72 hours to determine treatment plan.  Patient is aware that all the treatments I do including chemotherapy if needed can be done as outpatient. Accordingly length of her hospital stay will depend on how she does with nutrition, resolution of diarrhea, etc. At this point she may benefit from Physical Therapy/ OT consult.  Will follow with you.   Chauncey Cruel, MD 04/15/2018  9:22 AM Medical Oncology and Hematology [facility] [address] [place], [place] [address] Tel. [phone]    Fax. [phone]

## 2018-04-15 NOTE — Progress Notes (Signed)
Modified Barium Swallow Progress Note  Patient Details  Name: Morgan Morrow MRN: [id number] Date of Birth: [birth year]  Today's Date: 04/15/2018  Modified Barium Swallow completed.  Full report located under Chart Review in the Imaging Section.  Brief recommendations include the following:  Clinical Impression  Patient presents with mild pharyngeal dysphagia with decreased timing and adequacy of laryngeal elevation/closure causing minimal aspiration of thin with head neutral.  Aspiration was silent when trace but audible with cough response when mild. Chin tuck posture with pt fully upright and small boluses, airway protection intact.   Pharyngeal swallow is strong without significant residuals with thin, nectar, cracker - Limited amount of barium pt was provided due to pt's bowel issue.  Recommend advance diet to regular/thin with chin tuck posture.  Educated pt to aspiration precautions and mitigation strategies using teach back.     Swallow Evaluation Recommendations   Recommended Consults: Other (Comment)(note pt for barium swallow tomorrow)   SLP Diet Recommendations: Regular solids;Thin liquid   Liquid Administration via: Straw(TUCK CHIN with STRAWS)   Medication Administration: Whole meds with puree   Supervision: Full supervision/cueing for compensatory strategies   Compensations: Slow rate;Small sips/bites;Chin tuck   Postural Changes: Remain semi-upright after after feeds/meals (Comment);Seated upright at 90 degrees   Oral Care Recommendations: Oral care QID       Luanna Salk, MS [facility] SLP Acute Rehab Services Pager [phone] Office [phone]  Macario Golds 04/15/2018,2:18 PM

## 2018-04-16 ENCOUNTER — Encounter (HOSPITAL_COMMUNITY)
Admission: EM | Disposition: A | Discharge status: Skilled Nursing Facility | Payer: Self-pay | Source: Home / Self Care | Attending: Internal Medicine

## 2018-04-16 ENCOUNTER — Encounter (HOSPITAL_COMMUNITY): Payer: Self-pay | Admitting: *Deleted

## 2018-04-16 ENCOUNTER — Inpatient Hospital Stay (HOSPITAL_COMMUNITY): Payer: Medicare Other | Admitting: Certified Registered Nurse Anesthetist

## 2018-04-16 ENCOUNTER — Inpatient Hospital Stay (HOSPITAL_COMMUNITY): Payer: Medicare Other

## 2018-04-16 HISTORY — PX: BREAST BIOPSY: SHX20

## 2018-04-16 LAB — CBC WITH DIFFERENTIAL/PLATELET
Abs Immature Granulocytes: 0.16 10*3/uL — ABNORMAL HIGH (ref 0.00–0.07)
Basophils Absolute: 0 10*3/uL (ref 0.0–0.1)
Basophils Relative: 0 %
Eosinophils Absolute: 0.1 10*3/uL (ref 0.0–0.5)
Eosinophils Relative: 2 %
HCT: 27.8 % — ABNORMAL LOW (ref 36.0–46.0)
Hemoglobin: 8.6 g/dL — ABNORMAL LOW (ref 12.0–15.0)
Immature Granulocytes: 3 %
Lymphocytes Relative: 16 %
Lymphs Abs: 0.8 10*3/uL (ref 0.7–4.0)
MCH: 30.1 pg (ref 26.0–34.0)
MCHC: 30.9 g/dL (ref 30.0–36.0)
MCV: 97.2 fL (ref 80.0–100.0)
Monocytes Absolute: 0.5 10*3/uL (ref 0.1–1.0)
Monocytes Relative: 9 %
NEUTROS PCT: 70 %
Neutro Abs: 3.6 10*3/uL (ref 1.7–7.7)
Platelets: 171 10*3/uL (ref 150–400)
RBC: 2.86 MIL/uL — ABNORMAL LOW (ref 3.87–5.11)
RDW: 14 % (ref 11.5–15.5)
WBC: 5.1 10*3/uL (ref 4.0–10.5)
nRBC: 0 % (ref 0.0–0.2)

## 2018-04-16 LAB — PROTEIN ELECTROPHORESIS, SERUM
A/G Ratio: 0.9 (ref 0.7–1.7)
Albumin ELP: 2.6 g/dL — ABNORMAL LOW (ref 2.9–4.4)
Alpha-1-Globulin: 0.5 g/dL — ABNORMAL HIGH (ref 0.0–0.4)
Alpha-2-Globulin: 1 g/dL (ref 0.4–1.0)
Beta Globulin: 1 g/dL (ref 0.7–1.3)
GAMMA GLOBULIN: 0.5 g/dL (ref 0.4–1.8)
Globulin, Total: 3 g/dL (ref 2.2–3.9)
Total Protein ELP: 5.6 g/dL — ABNORMAL LOW (ref 6.0–8.5)

## 2018-04-16 LAB — BASIC METABOLIC PANEL
Anion gap: 9 (ref 5–15)
BUN: 29 mg/dL — AB (ref 8–23)
CO2: 22 mmol/L (ref 22–32)
Calcium: 8.4 mg/dL — ABNORMAL LOW (ref 8.9–10.3)
Chloride: 111 mmol/L (ref 98–111)
Creatinine, Ser: 0.82 mg/dL (ref 0.44–1.00)
GFR calc Af Amer: >60 mL/min (ref >60)
GFR calc non Af Amer: >60 mL/min (ref >60)
Glucose, Bld: 84 mg/dL (ref 70–99)
Potassium: 3.2 mmol/L — ABNORMAL LOW (ref 3.5–5.1)
SODIUM: 142 mmol/L (ref 135–145)

## 2018-04-16 LAB — MRSA PCR SCREENING: MRSA by PCR: NEGATIVE

## 2018-04-16 LAB — MAGNESIUM: Magnesium: 1.9 mg/dL (ref 1.7–2.4)

## 2018-04-16 SURGERY — BREAST BIOPSY
Anesthesia: Monitor Anesthesia Care | Site: Breast | Laterality: Right

## 2018-04-16 MED ORDER — ONDANSETRON HCL 4 MG/2ML IJ SOLN
INTRAMUSCULAR | Status: DC | PRN
  Administered 2018-04-16: 4 mg via INTRAVENOUS

## 2018-04-16 MED ORDER — FENTANYL CITRATE (PF) 100 MCG/2ML IJ SOLN
INTRAMUSCULAR | Status: AC
  Filled 2018-04-16: qty 2

## 2018-04-16 MED ORDER — POTASSIUM CHLORIDE 20 MEQ PO PACK
40.0000 meq | PACK | ORAL | Status: AC
  Filled 2018-04-16 (×2): qty 2

## 2018-04-16 MED ORDER — LACTATED RINGERS IV SOLN
INTRAVENOUS | Status: DC | PRN
  Administered 2018-04-16: 13:00:00 via INTRAVENOUS

## 2018-04-16 MED ORDER — BACITRACIN-NEOMYCIN-POLYMYXIN 400-5-5000 EX OINT
TOPICAL_OINTMENT | CUTANEOUS | Status: AC
  Filled 2018-04-16: qty 1

## 2018-04-16 MED ORDER — BUPIVACAINE-EPINEPHRINE 0.25% -1:200000 IJ SOLN
INTRAMUSCULAR | Status: DC | PRN
  Administered 2018-04-16: 2 mL

## 2018-04-16 MED ORDER — BUPIVACAINE-EPINEPHRINE (PF) 0.25% -1:200000 IJ SOLN
INTRAMUSCULAR | Status: AC
  Filled 2018-04-16: qty 30

## 2018-04-16 MED ORDER — PROPOFOL 10 MG/ML IV BOLUS
INTRAVENOUS | Status: AC
  Filled 2018-04-16: qty 20

## 2018-04-16 MED ORDER — PROPOFOL 500 MG/50ML IV EMUL
INTRAVENOUS | Status: DC | PRN
  Administered 2018-04-16: 75 ug/kg/min via INTRAVENOUS

## 2018-04-16 MED ORDER — PROPOFOL 10 MG/ML IV BOLUS
INTRAVENOUS | Status: AC
  Filled 2018-04-16: qty 60

## 2018-04-16 MED ORDER — LACTATED RINGERS IV SOLN
INTRAVENOUS | Status: DC
  Administered 2018-04-16 – 2018-04-20 (×7): via INTRAVENOUS

## 2018-04-16 MED ORDER — PROPOFOL 500 MG/50ML IV EMUL: INTRAVENOUS | Status: DC | PRN

## 2018-04-16 MED ORDER — 0.9 % SODIUM CHLORIDE (POUR BTL) OPTIME
TOPICAL | Status: DC | PRN
  Administered 2018-04-16: 1000 mL

## 2018-04-16 MED ORDER — FENTANYL CITRATE (PF) 100 MCG/2ML IJ SOLN
INTRAMUSCULAR | Status: DC | PRN
  Administered 2018-04-16 (×2): 25 ug via INTRAVENOUS

## 2018-04-16 MED ORDER — MIDAZOLAM HCL 2 MG/2ML IJ SOLN
INTRAMUSCULAR | Status: AC
  Filled 2018-04-16: qty 2

## 2018-04-16 MED ORDER — FUROSEMIDE 10 MG/ML IJ SOLN
20.0000 mg | Freq: Once | INTRAMUSCULAR | Status: AC
  Administered 2018-04-16: 20 mg via INTRAVENOUS
  Filled 2018-04-16: qty 2

## 2018-04-16 MED ORDER — BACITRACIN-NEOMYCIN-POLYMYXIN 400-5-5000 EX OINT
TOPICAL_OINTMENT | CUTANEOUS | Status: DC | PRN
  Administered 2018-04-16: 1 via TOPICAL

## 2018-04-16 SURGICAL SUPPLY — 34 items
BENZOIN TINCTURE PRP APPL 2/3 (GAUZE/BANDAGES/DRESSINGS) ×3 IMPLANT
BLADE HEX COATED 2.75 (ELECTRODE) ×3 IMPLANT
BLADE SURG 15 STRL LF DISP TIS (BLADE) ×2 IMPLANT
BLADE SURG 15 STRL SS (BLADE) ×4
CLOSURE WOUND 1/2 X4 (GAUZE/BANDAGES/DRESSINGS) ×1
COVER SURGICAL LIGHT HANDLE (MISCELLANEOUS) ×3 IMPLANT
COVER WAND RF STERILE (DRAPES) IMPLANT
DECANTER SPIKE VIAL GLASS SM (MISCELLANEOUS) ×3 IMPLANT
DRAPE LAPAROTOMY TRNSV 102X78 (DRAPE) ×3 IMPLANT
ELECT PENCIL ROCKER SW 15FT (MISCELLANEOUS) ×3 IMPLANT
ELECT REM PT RETURN 15FT ADLT (MISCELLANEOUS) ×3 IMPLANT
GAUZE 4X4 16PLY RFD (DISPOSABLE) ×3 IMPLANT
GAUZE SPONGE 2X2 8PLY STRL LF (GAUZE/BANDAGES/DRESSINGS) IMPLANT
GAUZE SPONGE 4X4 12PLY STRL (GAUZE/BANDAGES/DRESSINGS) ×3 IMPLANT
GLOVE BIOGEL M 8.0 STRL (GLOVE) ×3 IMPLANT
GLOVE BIOGEL PI IND STRL 7.0 (GLOVE) ×1 IMPLANT
GLOVE BIOGEL PI INDICATOR 7.0 (GLOVE) ×2
GOWN SPEC L4 XLG W/TWL (GOWN DISPOSABLE) ×3 IMPLANT
GOWN STRL REUS W/TWL LRG LVL3 (GOWN DISPOSABLE) ×3 IMPLANT
GOWN STRL REUS W/TWL XL LVL3 (GOWN DISPOSABLE) ×9 IMPLANT
KIT BASIN OR (CUSTOM PROCEDURE TRAY) ×3 IMPLANT
MARKER SKIN DUAL TIP RULER LAB (MISCELLANEOUS) ×3 IMPLANT
NDL HYPO 25X1 1.5 SAFETY (NEEDLE) ×1 IMPLANT
NEEDLE HYPO 22GX1.5 SAFETY (NEEDLE) IMPLANT
NEEDLE HYPO 25X1 1.5 SAFETY (NEEDLE) ×3 IMPLANT
NS IRRIG 1000ML POUR BTL (IV SOLUTION) ×3 IMPLANT
PACK BASIC VI WITH GOWN DISP (CUSTOM PROCEDURE TRAY) ×3 IMPLANT
PUNCH BIOPSY 3 (MISCELLANEOUS) ×6 IMPLANT
SPONGE GAUZE 2X2 STER 10/PKG (GAUZE/BANDAGES/DRESSINGS) ×2
STRIP CLOSURE SKIN 1/2X4 (GAUZE/BANDAGES/DRESSINGS) ×2 IMPLANT
SUT VIC AB 4-0 SH 18 (SUTURE) ×3 IMPLANT
SYR CONTROL 10ML LL (SYRINGE) ×3 IMPLANT
TAPE CLOTH SURG 4X10 WHT LF (GAUZE/BANDAGES/DRESSINGS) ×2 IMPLANT
YANKAUER SUCT BULB TIP 10FT TU (MISCELLANEOUS) IMPLANT

## 2018-04-16 NOTE — Transfer of Care (Signed)
Immediate Anesthesia Transfer of Care Note  Patient: Morgan Morrow  Procedure(s) Performed: RIGHT BREAST BIOPSY AND RIGHT POSTERIOR  SCALP BIOPSY AND TOP SCALP BIOPSY (Right Breast)  Patient Location: PACU  Anesthesia Type:MAC  Level of Consciousness: awake, oriented, drowsy and patient cooperative  Airway & Oxygen Therapy: Patient Spontanous Breathing and Patient connected to nasal cannula oxygen  Post-op Assessment: Report given to RN, Post -op Vital signs reviewed and stable and Patient moving all extremities  Post vital signs: Reviewed and stable  Last Vitals:  Vitals Value Taken Time  BP    Temp    Pulse 75 04/16/2018  2:29 PM  Resp    SpO2 96 % 04/16/2018  2:29 PM  Vitals shown include unvalidated device data.  Last Pain:  Vitals:   04/16/18 1237  TempSrc:   PainSc: 0-No pain         Complications: No apparent anesthesia complications

## 2018-04-16 NOTE — Anesthesia Preprocedure Evaluation (Signed)
Anesthesia Evaluation  Patient identified by MRN, date of birth, ID band Patient awake    Reviewed: Allergy & Precautions, NPO status , Patient's Chart, lab work & pertinent test results  Airway Mallampati: II  TM Distance: >3 FB Neck ROM: Full    Dental no notable dental hx. (+) Teeth Intact, Dental Advisory Given, Chipped,    Pulmonary neg pulmonary ROS,    Pulmonary exam normal breath sounds clear to auscultation       Cardiovascular hypertension, Pt. on medications Normal cardiovascular exam Rhythm:Regular Rate:Normal     Neuro/Psych negative neurological ROS  negative psych ROS   GI/Hepatic Neg liver ROS, PUD,   Endo/Other  negative endocrine ROS  Renal/GU negative Renal ROS     Musculoskeletal negative musculoskeletal ROS (+)   Abdominal   Peds  Hematology  (+) Blood dyscrasia, anemia ,   Anesthesia Other Findings   Reproductive/Obstetrics                             Anesthesia Physical Anesthesia Plan  ASA: III  Anesthesia Plan: MAC   Post-op Pain Management:    Induction: Intravenous  PONV Risk Score and Plan: 4 or greater and Treatment may vary due to age or medical condition, Dexamethasone, Ondansetron and Midazolam  Airway Management Planned: Nasal Cannula and Natural Airway  Additional Equipment:   Intra-op Plan:   Post-operative Plan:   Informed Consent: I have reviewed the patients History and Physical, chart, labs and discussed the procedure including the risks, benefits and alternatives for the proposed anesthesia with the patient or authorized representative who has indicated his/her understanding and acceptance.     Plan Discussed with: CRNA  Anesthesia Plan Comments:         Anesthesia Quick Evaluation

## 2018-04-16 NOTE — Progress Notes (Signed)
[facility] Progress Note     Subjective: CC-  Several family members at bedside.  No new complaints. Wants to get surgery over with.  Objective: Vital signs in last 24 hours: Temp:  [98.3 F (36.8 C)-99 F (37.2 C)] 98.3 F (36.8 C) (01/13 0540) Pulse Rate:  [90-93] 90 (01/13 0540) Resp:  [19-20] 19 (01/13 0540) BP: (118-130)/(60-84) 130/73 (01/13 0540) SpO2:  [95 %-100 %] 97 % (01/13 0540) Last BM Date: 04/15/18  Intake/Output from previous day: 01/12 0701 - 01/13 0700 In: 320 [P.O.:320] Out: 400 [Urine:400] Intake/Output this shift: No intake/output data recorded.  PE: Gen:  Alert, frail, NAD HEENT: EOM's intact, pupils equal and round Card:  RRR, +murmur Breast: Right breast with about 7x7cm large erythematous hard lesion around nipple Pulm:  effort normal Abd: Soft, NT/ND, +BS, no HSM Skin: warm and dry  Lab Results:  Recent Labs    04/15/18 0544 04/16/18 0617  WBC 4.4 5.1  HGB 9.0* 8.6*  HCT 29.1* 27.8*  PLT 182 171   BMET Recent Labs    04/15/18 0544 04/16/18 0617  NA 142 142  K 3.6 3.2*  CL 110 111  CO2 23 22  GLUCOSE 72 84  BUN 33* 29*  CREATININE 0.97 0.82  CALCIUM 8.8* 8.4*   PT/INR Recent Labs    04/14/18 0656  LABPROT 12.8  INR 0.97   CMP     Component Value Date/Time   NA 142 04/16/2018 0617   K 3.2 (L) 04/16/2018 0617   CL 111 04/16/2018 0617   CO2 22 04/16/2018 0617   GLUCOSE 84 04/16/2018 0617   BUN 29 (H) 04/16/2018 0617   CREATININE 0.82 04/16/2018 0617   CALCIUM 8.4 (L) 04/16/2018 0617   PROT 5.4 (L) 04/15/2018 0544   ALBUMIN 2.4 (L) 04/15/2018 0544   AST 125 (H) 04/15/2018 0544   ALT 28 04/15/2018 0544   ALKPHOS 310 (H) 04/15/2018 0544   BILITOT 0.6 04/15/2018 0544   GFRNONAA >60 04/16/2018 0617   GFRAA >60 04/16/2018 0617   Lipase     Component Value Date/Time   LIPASE 74 (H) 04/15/2018 0544       Studies/Results: Ct Head W & Wo Contrast  Result Date: 04/14/2018 CLINICAL DATA:   Progressive weakness and weight loss for several months. Diarrhea. Liver and bone metastases. Unknown primary. Bilateral pulmonary nodules. EXAM: CT HEAD WITHOUT AND WITH CONTRAST TECHNIQUE: Contiguous axial images were obtained from the base of the skull through the vertex without and with intravenous contrast CONTRAST:  18m OMNIPAQUE IOHEXOL 300 MG/ML  SOLN COMPARISON:  None. FINDINGS: Brain: Mild atrophy and moderate diffuse white matter disease is present. There is no pathologic enhancement to suggest metastatic disease to the brain. The ventricles are of proportionate to the degree of atrophy. Remote lacunar infarcts are present in the basal ganglia bilaterally. Brainstem and cerebellum are normal. Vascular: Atherosclerotic calcifications are present within the cavernous internal carotid arteries and at the dural margin the left vertebral artery. There is no hyperdense vessel. Skull: Scattered lytic lesions present throughout the calvarium compatible with known bone metastases. No expansile lesions are present. An indeterminate scalp soft tissue lesion over the left parietal skull near the vertex measures 2.5 x 2.4 x 0.6 cm. There is diffuse infiltration of the calvarium subjacent to this area. Sinuses/Orbits: The paranasal sinuses and mastoid air cells are clear. The globes and orbits are within normal limits. IMPRESSION: 1. No evidence for metastatic disease to brain. 2. Extensive osseous metastases  throughout the skull. 3. Focal soft tissue in the high left parietal scalp. This could represent a metastatic deposit. Electronically Signed   By: San Morelle M.D.   On: 04/14/2018 12:45   Ct Chest W Contrast  Result Date: 04/14/2018 CLINICAL DATA:  Right middle lobe pneumonia. EXAM: CT CHEST WITH CONTRAST TECHNIQUE: Multidetector CT imaging of the chest was performed during intravenous contrast administration. CONTRAST:  70m OMNIPAQUE IOHEXOL 300 MG/ML  SOLN COMPARISON:  Chest x-ray 04/13/2018  FINDINGS: Cardiovascular: Heart size upper normal. No substantial pericardial effusion. Coronary artery calcification is evident. Atherosclerotic calcification is noted in the wall of the thoracic aorta. Ascending thoracic aorta measures 3.8 cm diameter. Mediastinum/Nodes: Soft tissue attenuation is identified in the AP window without a discrete lymph node. Small prevascular lymph nodes are so seated with small lymph nodes in each hilar region. Circumferential wall thickening is noted in the distal esophagus and fluid within the esophageal lumen is compatible with reflux or dysmotility. There is no axillary lymphadenopathy. Lungs/Pleura: The central tracheobronchial airways are patent. Numerous ill-defined pulmonary nodules are scattered through both lungs. Index nodule medial right upper lobe (45/11) measures 5 mm. Index right lower lobe nodule seen posteriorly on 102/11 measures 8 mm. Representative peripheral left lower lobe nodule (83/11) measures 6 mm. Small bilateral pleural effusions evident. Upper Abdomen: 11 mm low-density lesion in the liver, better characterized on yesterday's abdomen CT. Additional small low-density lesions in the liver. Calcified gallstones evident. Small cyst noted right kidney. Musculoskeletal: Widespread bony metastatic involvement is evident. 7.7 x 6.8 x 2.0 cm mass identified in the right breast. IMPRESSION: 1. No evidence for right middle lobe pneumonia. Opacity seen over the right breast on recent chest x-ray likely represents the patient's known large right breast mass. 2. Numerous ill-defined pulmonary nodules scattered through both lungs, highly suspicious for metastatic disease. 3. Widespread bony metastatic involvement. 4. Hypodense liver lesions concerning for metastatic involvement. 5. Small bilateral pleural effusions. 6.  Aortic Atherosclerois (ICD10-170.0) Electronically Signed   By: EMisty StanleyM.D.   On: 04/14/2018 13:52   Dg Swallowing Func-speech  Pathology  Result Date: 04/15/2018 Objective Swallowing Evaluation: Type of Study: MBS-Modified Barium Swallow Study  Patient Details Name: Morgan DUERRMRN: [id number] of Birth: 6August 18, 1942Today's Date: 04/15/2018 Time: SLP Start Time (ACUTE ONLY): 11093-SLP Stop Time (ACUTE ONLY): 1350 SLP Time Calculation (min) (ACUTE ONLY): 15 min Past Medical History: Past Medical History: Diagnosis Date . Allergy  . Hypertension  Past Surgical History: No past surgical history on file. HPI: 78yo patient referred for MBS due to pt having dysphagia.  Pt admitted with cachexia, stool burden, breast and scalp mass - likely metastatic breast cancer.  She underwent clinical evaluation of swallow yesterday and was placed on honey thick liquids and regular foods.  MBS indicated.  Reports voice became hoarse last April - denies significant issues with reflux.   Subjective: The patient was seen sitting upright in chair Assessment / Plan / Recommendation CHL IP CLINICAL IMPRESSIONS 04/15/2018 Clinical Impression Patient presents with mild pharyngeal dysphagia with decreased timing and adequacy of laryngeal elevation/closure causing minimal aspiration of thin with head neutral.  Aspiration was silent when trace but audible with cough response when mild. Chin tuck posture with pt fully upright and small boluses, airway protection intact.   Pharyngeal swallow is strong without significant residuals with thin, nectar, cracker - Limited amount of barium pt was provided due to pt's bowel issue.  Recommend advance diet to regular/thin with  chin tuck posture.  Educated pt to aspiration precautions and mitigation strategies using teach back.   SLP Visit Diagnosis Dysphagia, pharyngeal phase (R13.13) Attention and concentration deficit following -- Frontal lobe and executive function deficit following -- Impact on safety and function Mild aspiration risk   CHL IP TREATMENT RECOMMENDATION 04/15/2018 Treatment Recommendations Therapy as outlined  in treatment plan below   Prognosis 04/15/2018 Prognosis for Safe Diet Advancement Fair Barriers to Reach Goals Other (Comment) Barriers/Prognosis Comment -- CHL IP DIET RECOMMENDATION 04/15/2018 SLP Diet Recommendations Regular solids;Thin liquid Liquid Administration via Straw Medication Administration Whole meds with puree Compensations Slow rate;Small sips/bites;Chin tuck Postural Changes Remain semi-upright after after feeds/meals (Comment);Seated upright at 90 degrees   CHL IP OTHER RECOMMENDATIONS 04/15/2018 Recommended Consults Other (Comment) Oral Care Recommendations Oral care QID Other Recommendations --   CHL IP FOLLOW UP RECOMMENDATIONS 04/14/2018 Follow up Recommendations Other (comment)   CHL IP FREQUENCY AND DURATION 04/15/2018 Speech Therapy Frequency (ACUTE ONLY) min 2x/week Treatment Duration 2 weeks      CHL IP ORAL PHASE 04/15/2018 Oral Phase Impaired Oral - Pudding Teaspoon -- Oral - Pudding Cup -- Oral - Honey Teaspoon -- Oral - Honey Cup -- Oral - Nectar Teaspoon -- Oral - Nectar Cup WFL Oral - Nectar Straw -- Oral - Thin Teaspoon WFL Oral - Thin Cup WFL Oral - Thin Straw WFL Oral - Puree NT Oral - Mech Soft -- Oral - Regular WFL Oral - Multi-Consistency -- Oral - Pill -- Oral Phase - Comment --  CHL IP PHARYNGEAL PHASE 04/15/2018 Pharyngeal Phase Impaired Pharyngeal- Pudding Teaspoon -- Pharyngeal -- Pharyngeal- Pudding Cup -- Pharyngeal -- Pharyngeal- Honey Teaspoon -- Pharyngeal -- Pharyngeal- Honey Cup -- Pharyngeal -- Pharyngeal- Nectar Teaspoon -- Pharyngeal -- Pharyngeal- Nectar Cup Penetration/Aspiration during swallow;WFL Pharyngeal -- Pharyngeal- Nectar Straw -- Pharyngeal -- Pharyngeal- Thin Teaspoon WFL Pharyngeal -- Pharyngeal- Thin Cup Reduced airway/laryngeal closure;Penetration/Aspiration during swallow Pharyngeal Material enters airway, passes BELOW cords without attempt by patient to eject out (silent aspiration) Pharyngeal- Thin Straw Penetration/Apiration after swallow Pharyngeal  Material enters airway, passes BELOW cords without attempt by patient to eject out (silent aspiration);Material enters airway, passes BELOW cords and not ejected out despite cough attempt by patient Pharyngeal- Puree NT Pharyngeal -- Pharyngeal- Mechanical Soft -- Pharyngeal -- Pharyngeal- Regular WFL Pharyngeal -- Pharyngeal- Multi-consistency -- Pharyngeal -- Pharyngeal- Pill -- Pharyngeal -- Pharyngeal Comment chin tuck helpful to prevent aspiration - trace penetration only which cleared  CHL IP CERVICAL ESOPHAGEAL PHASE 04/15/2018 Cervical Esophageal Phase WFL Pudding Teaspoon -- Pudding Cup -- Honey Teaspoon -- Honey Cup -- Nectar Teaspoon -- Nectar Cup -- Nectar Straw -- Thin Teaspoon -- Thin Cup -- Thin Straw -- Puree -- Mechanical Soft -- Regular -- Multi-consistency -- Pill -- Cervical Esophageal Comment -- Macario Golds 04/15/2018, 2:18 PM    Luanna Salk, MS [facility] SLP Acute Rehab Services Pager [phone] Office [phone]            Anti-infectives: Anti-infectives (From admission, onward)   Start     Dose/Rate Route Frequency Ordered Stop   04/16/18 0600  ceFAZolin (ANCEF) IVPB 2g/100 mL premix     2 g 200 mL/hr over 30 Minutes Intravenous On call to O.R. 04/15/18 2104 04/17/18 0559   04/13/18 2230  azithromycin (ZITHROMAX) 500 mg in sodium chloride 0.9 % 250 mL IVPB  Status:  Discontinued     500 mg 250 mL/hr over 60 Minutes Intravenous Every 24 hours 04/13/18 2130 04/15/18 1556   04/13/18  2200  doxycycline (VIBRA-TABS) tablet 100 mg  Status:  Discontinued     100 mg Oral Every 12 hours 04/13/18 1928 04/13/18 2130   04/13/18 1930  cefTRIAXone (ROCEPHIN) 1 g in sodium chloride 0.9 % 100 mL IVPB  Status:  Discontinued     1 g 200 mL/hr over 30 Minutes Intravenous Every 24 hours 04/13/18 1928 04/15/18 1556       Assessment/Plan FTT Dysphagia - barium swallow with ST pending Murmur - ECHO pending Anemia  Breast mass with numerous subcutaneous and other lesions, probably  metastatic breast cancer - OR today for breast biopsy and scalp biopsy. Keep NPO. - oncology following   ID - none currently FEN - IVF, NPO VTE - SCDs Foley - none   LOS: 3 days    Wellington Hampshire , [facility] Surgery 04/16/2018, 9:43 AM Pager: [phone] Mon 7:00 am -11:30 AM Tues-Fri 7:00 am-4:30 pm Sat-Sun 7:00 am-11:30 am

## 2018-04-16 NOTE — Care Management Important Message (Signed)
Important Message  Patient Details  Name: Morgan Morrow MRN: [id number] Date of Birth: 24-Jan-1941   Medicare Important Message Given:  Yes    Kerin Salen 04/16/2018, 11:47 AMImportant Message  Patient Details  Name: Morgan Morrow MRN: [id number] Date of Birth: 12-08-1940   Medicare Important Message Given:  Yes    Kerin Salen 04/16/2018, 11:47 AM

## 2018-04-16 NOTE — Progress Notes (Signed)
[facility] Gastroenterology Progress Note  Morgan Morrow 78 y.o. 1941/03/29  CC: Dysphagia, metastatic breast cancer   Subjective: Patient resting comfortably in the recliner.  Denies acute GI issues.   Objective: Vital signs in last 24 hours: Vitals:   04/15/18 2057 04/16/18 0540  BP: 118/84 130/73  Pulse: 93 90  Resp: 19 19  Temp: 99 F (37.2 C) 98.3 F (36.8 C)  SpO2: 95% 97%    Physical Exam:  General.  Alert/oriented x2.  Not in acute distress. ABD:   Soft, nontender, nondistended, bowel sounds present.  No peritoneal signs    Lab Results: Recent Labs    04/13/18 1740  04/15/18 0544 04/16/18 0617  NA 141   < > 142 142  K 3.2*   < > 3.6 3.2*  CL 101   < > 110 111  CO2 28   < > 23 22  GLUCOSE 132*   < > 72 84  BUN 51*   < > 33* 29*  CREATININE 1.09*   < > 0.97 0.82  CALCIUM 9.7   < > 8.8* 8.4*  MG 2.3  --   --  1.9   < > = values in this interval not displayed.   Recent Labs    04/14/18 0656 04/15/18 0544  AST 111* 125*  ALT 27 28  ALKPHOS 221* 310*  BILITOT 0.5 0.6  PROT 5.3* 5.4*  ALBUMIN 2.5* 2.4*   Recent Labs    04/15/18 0544 04/16/18 0617  WBC 4.4 5.1  NEUTROABS 2.8 3.6  HGB 9.0* 8.6*  HCT 29.1* 27.8*  MCV 97.0 97.2  PLT 182 171   Recent Labs    04/14/18 0656  LABPROT 12.8  INR 0.97      Assessment/Plan: -Failure to thrive with esophageal dysphagia .  Barium swallow showed no obstructive lesion but smooth narrowing of the distal esophagus and slight lobulated appearance just above the stricture. -Metastatic breast cancer -Abnormal CT scan showing large stool ball in the rectum with circumferential anorectal thickening concerning for stercoral proctitis. -Abnormal LFTs.  Alkaline phosphatase 310 and AST 125.  Normal ALT and T bili.  Normal INR.  Normal platelet.?? from metastatic disease.  Recent CT scan did showed 3 hypodense lesions of the liver   Recommendations --------------------------- -Findings of barium swallow  discussed with patient and patient's family.  They declined EGD at this time.  They are awaiting biopsies.  If biopsies nonconclusive, they may reconsider EGD.   Pelase call us back if needed.  Otis Brace MD, FACP 04/16/2018, 9:22 AM  Contact #  [phone]

## 2018-04-16 NOTE — Interval H&P Note (Signed)
History and Physical Interval Note:  04/16/2018 1:21 PM  Morgan Morrow  has presented today for surgery, with the diagnosis of cancer  The various methods of treatment have been discussed with the patient and family. After consideration of risks, benefits and other options for treatment, the patient has consented to  Procedure(s): BREAST BIOPSY AND SCALP BIOPSY (N/A) as a surgical intervention .  The patient's history has been reviewed, patient examined, no change in status, stable for surgery.  I have reviewed the patient's chart and labs.  Questions were answered to the patient's satisfaction.     Pedro Earls

## 2018-04-16 NOTE — Progress Notes (Signed)
Pt gone for procedure. Will check back for OT eval next day  Kari Baars, [place] [phone] [phone]

## 2018-04-16 NOTE — Progress Notes (Addendum)
PROGRESS NOTE    Morgan Morrow  WUJ:[id number] DOB: [birth year] DOA: 04/13/2018 PCP: Leighton Ruff, MD   Brief Narrative:  Per Dr Peyton Najjar Morgan Morrow is a 78 y.o. female with medical history significant for htn, who presented with weight loss and diarrhea.    History obtained from patient and her daughter.  Daughter reported decline in health over the past year, getting more rapid. Significant weight loss, difficulty swallowing, hoarseness of voice, difficulty with mobility, and increased confusion. Has a PCP, was last seen in approximately November, patient and family unaware of results of that work-up.  Presented to ED mainly because daughter says patient finally relented, which she explains to mean she has been concerned about her mother's health for some time. The only acute change is about 2 weeks of diarrhea, no blood or mucous. No fevers, no recent travel or antibiotics. No abdominal pain. Denies cough or shortness of breath. Denies fevers or recent falls.   ED Course: fecal disempaction, labs, antibiotics, fluids, ct, CXR    Assessment & Plan:   Principal Problem:   Metastatic breast cancer ([place]) Active Problems:   Cachexia ([place])   Hematochezia   Community acquired pneumonia   Constipation, chronic   Osteolytic lesion   FTT (failure to thrive) in adult   Anemia   Murmur, cardiac   Diarrhea   Fecal impaction in rectum ([facility])   Hypokalemia   Breast mass, probable cancer   Scalp mass, probable breast cancer metastasis   Stercoral proctocolitis of rectum  1 probable metastatic breast cancer/FTT Patient notes to have right breast cancer, weight loss, increasing disability.  CT abdomen and pelvis with pulmonary opacities, extensive osteolytic lesions, hypodense lesions in the liver as well as mural thickening in the rectal mucosal and esophageal thickening.  Concerned that primary may be breast cancer.  Patient states last mammogram was approximately 10 years ago.   Oncology has been consulted and patient seen in consultation by Dr. Jana Hakim.  CT head was negative for any metastatic disease.  CT chest showing liver, lung and bone lesions.  CA 27-29 pending.  Oncology has consulted with general surgery for core needle biopsy.  Patient for core needle breast biopsy and scalp biopsy today per general surgery.  Per oncology.   2.  Dysphagia Patient with dysphagia and difficulty swallowing however unable to fully describe whether just to solids or liquids.  CT abdomen and pelvis with esophageal thickening.  Patient with significant weight loss, debility, hoarseness of voice.  Patient noted to be FOBT positive and anemic.  Hemoglobin currently at 9.0 from 8.9 from 10.7 on admission.  Likely partly dilutional.  Anemia panel consistent with anemia of chronic disease.  Continue PPI IV every 12 hours.  Patient has been seen in consultation by GI and GI recommended barium swallow which was done 04/16/2018 which did show no obstructive lesion but smooth narrowing of distal esophagus and slight lobulated appearance just above the stricture.  GI discussed findings with family who at this time have declined the EGD and awaiting breast biopsy results.  If breast biopsy results are inconclusive may need to reconsider EGD per GI.  Appreciate GIs input and recommendations.  Speech therapy also following.  3.  Fecal impaction/constipation/stercoral ulcer of the anus Fecal disimpaction was attempted in the ED.  Residual stool ball noted on CT.  No signs of perforation.  FOBT positive.  Patient given a fleets enema on day of admission, however no significant results.  Continue daily Dulcolax  suppositories.  Soapsuds enema x1.  Patient started on MiraLAX twice daily per general surgery.  Follow.   4.  Anemia Anemia panel consistent with a anemia of chronic disease.  Ferritin elevated at 2513.  B12 levels are 678.  Hemoglobin currently stable at 9.0 from 8.9 from 10.7 on admission.  Partly  likely dilutional.  FOBT was positive.  Patient has been seen by GI.  Continue PPI every 12 hours.  Transfusion threshold hemoglobin less than 7. Patient with barium swallow done with no obstructive lesion with smooth narrowing of distal esophagus and slightly lobulated appearance just above the stricture.  Patient seen by gastroenterology who discussed barium findings swallow with the family and have declined the EGD at this time.  See GI notes.  Follow.   5.  Murmur Noted on examination.  Patient states she has had a murmur for a long time.  2D echo with EF 55-60%, mild AV stenosis.   6.  Diarrhea Likely secondary to stool incontinence secondary to fecal impaction/constipation.  Discontinued C. difficile PCR and enteric precautions.  Continue Dulcolax suppository daily.  7.  Hypokalemia Magnesium level at 2.3.  Replete.  8.  Elevated lipase/ Patient denies any epigastric abdominal pain.  Tolerating diet.  Follow.  9.  ??? Community-acquired pneumonia Chest x-ray was suggestive of a pneumonia.  CT findings of abdomen and pelvis most suggestive of metastatic process ongoing.  Patient noted to be hypothermic and hypotensive on admission.  Patient pancultured.  CT chest done was negative for middle lobe pneumonia and findings noted on chest x-ray was felt to be secondary to breast cancer.  IV antibiotics discontinued. Follow.   10.  Hypothermia Likely secondary to metastatic cancer malnutrition.  Patient also noted to have concerns for community-acquired pneumonia on admission.  Off Retail banker.  Hypothermia seems to have resolved.  CT chest negative for pneumonia.  IV antibiotics have been discontinued.  Follow.   DVT prophylaxis: SCDs Code Status: Full Family Communication: Updated patient and family at bedside. Disposition Plan: Pending work-up.  Likely skilled nursing facility.   Consultants:   Oncology: Dr. Jana Hakim 04/14/2018  Gastroenterology: Dr. Michail Sermon 04/14/2018  General  surgery: Dr. Johney Maine 04/15/2018  Procedures:  .  CT abdomen and pelvis 04/13/2018 Chest x-ray 04/13/2018 CT head 04/14/2018 CT chest 04/14/2018 2D echo 04/15/2018 Barium swallow 04/16/2018    Antimicrobials:   IV Rocephin 04/13/2018>>>>>>> 04/15/2018  IV azithromycin 04/13/2018>>>>> 04/15/2018   Subjective: Patient sitting up in chair.  States some shortness of breath when she was ambulating back from the bathroom.  No bowel movement yet today.  Denies any chest pain.  Denies any overt GI bleed.    Objective: Vitals:   04/15/18 0759 04/15/18 1623 04/15/18 2057 04/16/18 0540  BP:  127/60 118/84 130/73  Pulse:  92 93 90  Resp:  20 19 19   Temp:  98.3 F (36.8 C) 99 F (37.2 C) 98.3 F (36.8 C)  TempSrc:  Oral Oral Oral  SpO2: 91% 100% 95% 97%  Weight:      Height:        Intake/Output Summary (Last 24 hours) at 04/16/2018 1049 Last data filed at 04/15/2018 1500 Gross per 24 hour  Intake 120 ml  Output 400 ml  Net -280 ml   Filed Weights   04/13/18 1638  Weight: 45.4 kg    Examination:  General exam: Pallor Respiratory system: Some bibasilar crackles.  No wheezing.  No rhonchi. Cardiovascular system: Regular rate and rhythm with 3/6 SEM. No  JVD, murmurs, rubs, gallops or clicks. No pedal edema. Gastrointestinal system: Abdomen is nontender, mildly distended,  soft, positive bowel sounds.  No rebound.  No guarding.  Central nervous system: Alert and oriented. No focal neurological deficits. Extremities: Symmetric 5 x 5 power. Skin: No rashes, lesions or ulcers Psychiatry: Judgement and insight appear normal. Mood & affect appropriate.  Breast; left breast normal.  Right breast with a large cancerous tissue/large erythematous hard lesion around nipple.    Data Reviewed: I have personally reviewed following labs and imaging studies  CBC: Recent Labs  Lab 04/13/18 1740 04/14/18 0656 04/14/18 1701 04/15/18 0544 04/16/18 0617  WBC 5.2 4.2  --  4.4 5.1  NEUTROABS 4.2   --   --  2.8 3.6  HGB 10.7* 8.9* 9.9* 9.0* 8.6*  HCT 33.5* 28.1* 31.8* 29.1* 27.8*  MCV 95.7 96.9  --  97.0 97.2  PLT 216 186  --  182 657   Basic Metabolic Panel: Recent Labs  Lab 04/13/18 1740 04/14/18 0656 04/15/18 0544 04/16/18 0617  NA 141 138 142 142  K 3.2* 3.6 3.6 3.2*  CL 101 102 110 111  CO2 28 26 23 22   GLUCOSE 132* 82 72 84  BUN 51* 40* 33* 29*  CREATININE 1.09* 0.92 0.97 0.82  CALCIUM 9.7 8.9 8.8* 8.4*  MG 2.3  --   --  1.9   GFR: Estimated Creatinine Clearance: 37.9 mL/min (by C-G formula based on SCr of 0.82 mg/dL). Liver Function Tests: Recent Labs  Lab 04/13/18 1740 04/14/18 0656 04/15/18 0544  AST 103* 111* 125*  ALT 30 27 28   ALKPHOS 249* 221* 310*  BILITOT 0.7 0.5 0.6  PROT 6.6 5.3* 5.4*  ALBUMIN 3.2* 2.5* 2.4*   Recent Labs  Lab 04/13/18 1740 04/14/18 0656 04/15/18 0544  LIPASE 321* 190* 74*   No results for input(s): AMMONIA in the last 168 hours. Coagulation Profile: Recent Labs  Lab 04/14/18 0656  INR 0.97   Cardiac Enzymes: Recent Labs  Lab 04/13/18 1740  TROPONINI <0.03   BNP (last 3 results) No results for input(s): PROBNP in the last 8760 hours. HbA1C: No results for input(s): HGBA1C in the last 72 hours. CBG: No results for input(s): GLUCAP in the last 168 hours. Lipid Profile: No results for input(s): CHOL, HDL, LDLCALC, TRIG, CHOLHDL, LDLDIRECT in the last 72 hours. Thyroid Function Tests: No results for input(s): TSH, T4TOTAL, FREET4, T3FREE, THYROIDAB in the last 72 hours. Anemia Panel: Recent Labs    04/14/18 0955  VITAMINB12 678  FOLATE 18.4  FERRITIN 2,513*  TIBC 311  IRON 57  RETICCTPCT 1.7   Sepsis Labs: Recent Labs  Lab 04/13/18 1818 04/13/18 2316  LATICACIDVEN 1.6 1.5    Recent Results (from the past 240 hour(s))  Culture, blood (routine x 2)     Status: None (Preliminary result)   Collection Time: 04/13/18 10:02 PM  Result Value Ref Range Status   Specimen Description   Final    BLOOD  RIGHT WRIST Performed at [facility], [place] [address], [place], [place] [address]    Special Requests   Final    BOTTLES DRAWN AEROBIC AND ANAEROBIC Blood Culture adequate volume Performed at [place] [address] [place], [place] [address]    Culture   Final    NO GROWTH 2 DAYS Performed at [place] [address] [place], [place] [address]    Report Status PENDING  Incomplete  Culture, blood (routine  x 2)     Status: None (Preliminary result)   Collection Time: 04/13/18 10:02 PM  Result Value Ref Range Status   Specimen Description   Final    BLOOD RIGHT HAND Performed at [facility], [place] [address], [place], [place] [address]    Special Requests   Final    BOTTLES DRAWN AEROBIC ONLY Blood Culture adequate volume Performed at [place] [address], [place], [place] [address]    Culture   Final    NO GROWTH 2 DAYS Performed at [place] [address] [place], [place] [address]    Report Status PENDING  Incomplete  MRSA PCR Screening     Status: None   Collection Time: 04/15/18 11:04 PM  Result Value Ref Range Status   MRSA by PCR NEGATIVE NEGATIVE Final    Comment:        The GeneXpert MRSA Assay (FDA approved for NASAL specimens only), is one component of a comprehensive MRSA colonization surveillance program. It is not intended to diagnose MRSA infection nor to guide or monitor treatment for MRSA infections. Performed at [facility], [place] [address], [place], [place] [address]          Radiology Studies: Ct Head W & Wo Contrast  Result Date: 04/14/2018 CLINICAL DATA:  Progressive weakness and weight loss for several months. Diarrhea. Liver and bone metastases. Unknown primary. Bilateral pulmonary nodules. EXAM: CT HEAD WITHOUT AND WITH CONTRAST TECHNIQUE: Contiguous axial images were obtained from the base of the skull through the vertex  without and with intravenous contrast CONTRAST:  20m OMNIPAQUE IOHEXOL 300 MG/ML  SOLN COMPARISON:  None. FINDINGS: Brain: Mild atrophy and moderate diffuse white matter disease is present. There is no pathologic enhancement to suggest metastatic disease to the brain. The ventricles are of proportionate to the degree of atrophy. Remote lacunar infarcts are present in the basal ganglia bilaterally. Brainstem and cerebellum are normal. Vascular: Atherosclerotic calcifications are present within the cavernous internal carotid arteries and at the dural margin the left vertebral artery. There is no hyperdense vessel. Skull: Scattered lytic lesions present throughout the calvarium compatible with known bone metastases. No expansile lesions are present. An indeterminate scalp soft tissue lesion over the left parietal skull near the vertex measures 2.5 x 2.4 x 0.6 cm. There is diffuse infiltration of the calvarium subjacent to this area. Sinuses/Orbits: The paranasal sinuses and mastoid air cells are clear. The globes and orbits are within normal limits. IMPRESSION: 1. No evidence for metastatic disease to brain. 2. Extensive osseous metastases throughout the skull. 3. Focal soft tissue in the high left parietal scalp. This could represent a metastatic deposit. Electronically Signed   By: CSan MorelleM.D.   On: 04/14/2018 12:45   Ct Chest W Contrast  Result Date: 04/14/2018 CLINICAL DATA:  Right middle lobe pneumonia. EXAM: CT CHEST WITH CONTRAST TECHNIQUE: Multidetector CT imaging of the chest was performed during intravenous contrast administration. CONTRAST:  723mOMNIPAQUE IOHEXOL 300 MG/ML  SOLN COMPARISON:  Chest x-ray 04/13/2018 FINDINGS: Cardiovascular: Heart size upper normal. No substantial pericardial effusion. Coronary artery calcification is evident. Atherosclerotic calcification is noted in the wall of the thoracic aorta. Ascending thoracic aorta measures 3.8 cm diameter. Mediastinum/Nodes: Soft  tissue attenuation is identified in the AP window without a discrete lymph node. Small prevascular lymph nodes are so seated with small lymph nodes in each hilar region. Circumferential wall thickening is noted in the distal esophagus  and fluid within the esophageal lumen is compatible with reflux or dysmotility. There is no axillary lymphadenopathy. Lungs/Pleura: The central tracheobronchial airways are patent. Numerous ill-defined pulmonary nodules are scattered through both lungs. Index nodule medial right upper lobe (45/11) measures 5 mm. Index right lower lobe nodule seen posteriorly on 102/11 measures 8 mm. Representative peripheral left lower lobe nodule (83/11) measures 6 mm. Small bilateral pleural effusions evident. Upper Abdomen: 11 mm low-density lesion in the liver, better characterized on yesterday's abdomen CT. Additional small low-density lesions in the liver. Calcified gallstones evident. Small cyst noted right kidney. Musculoskeletal: Widespread bony metastatic involvement is evident. 7.7 x 6.8 x 2.0 cm mass identified in the right breast. IMPRESSION: 1. No evidence for right middle lobe pneumonia. Opacity seen over the right breast on recent chest x-ray likely represents the patient's known large right breast mass. 2. Numerous ill-defined pulmonary nodules scattered through both lungs, highly suspicious for metastatic disease. 3. Widespread bony metastatic involvement. 4. Hypodense liver lesions concerning for metastatic involvement. 5. Small bilateral pleural effusions. 6.  Aortic Atherosclerois (ICD10-170.0) Electronically Signed   By: Misty Stanley M.D.   On: 04/14/2018 13:52   Dg Swallowing Func-speech Pathology  Result Date: 04/15/2018 Objective Swallowing Evaluation: Type of Study: MBS-Modified Barium Swallow Study  Patient Details Name: REXANNE INOCENCIO MRN: [id number] Date of Birth: 19-Mar-1941 Today's Date: 04/15/2018 Time: SLP Start Time (ACUTE ONLY): 3790 -SLP Stop Time (ACUTE ONLY): 1350  SLP Time Calculation (min) (ACUTE ONLY): 15 min Past Medical History: Past Medical History: Diagnosis Date . Allergy  . Hypertension  Past Surgical History: No past surgical history on file. HPI: 78 yo patient referred for MBS due to pt having dysphagia.  Pt admitted with cachexia, stool burden, breast and scalp mass - likely metastatic breast cancer.  She underwent clinical evaluation of swallow yesterday and was placed on honey thick liquids and regular foods.  MBS indicated.  Reports voice became hoarse last April - denies significant issues with reflux.   Subjective: The patient was seen sitting upright in chair Assessment / Plan / Recommendation CHL IP CLINICAL IMPRESSIONS 04/15/2018 Clinical Impression Patient presents with mild pharyngeal dysphagia with decreased timing and adequacy of laryngeal elevation/closure causing minimal aspiration of thin with head neutral.  Aspiration was silent when trace but audible with cough response when mild. Chin tuck posture with pt fully upright and small boluses, airway protection intact.   Pharyngeal swallow is strong without significant residuals with thin, nectar, cracker - Limited amount of barium pt was provided due to pt's bowel issue.  Recommend advance diet to regular/thin with chin tuck posture.  Educated pt to aspiration precautions and mitigation strategies using teach back.   SLP Visit Diagnosis Dysphagia, pharyngeal phase (R13.13) Attention and concentration deficit following -- Frontal lobe and executive function deficit following -- Impact on safety and function Mild aspiration risk   CHL IP TREATMENT RECOMMENDATION 04/15/2018 Treatment Recommendations Therapy as outlined in treatment plan below   Prognosis 04/15/2018 Prognosis for Safe Diet Advancement Fair Barriers to Reach Goals Other (Comment) Barriers/Prognosis Comment -- CHL IP DIET RECOMMENDATION 04/15/2018 SLP Diet Recommendations Regular solids;Thin liquid Liquid Administration via Straw Medication  Administration Whole meds with puree Compensations Slow rate;Small sips/bites;Chin tuck Postural Changes Remain semi-upright after after feeds/meals (Comment);Seated upright at 90 degrees   CHL IP OTHER RECOMMENDATIONS 04/15/2018 Recommended Consults Other (Comment) Oral Care Recommendations Oral care QID Other Recommendations --   CHL IP FOLLOW UP RECOMMENDATIONS 04/14/2018 Follow up Recommendations Other (comment)  CHL IP FREQUENCY AND DURATION 04/15/2018 Speech Therapy Frequency (ACUTE ONLY) min 2x/week Treatment Duration 2 weeks      CHL IP ORAL PHASE 04/15/2018 Oral Phase Impaired Oral - Pudding Teaspoon -- Oral - Pudding Cup -- Oral - Honey Teaspoon -- Oral - Honey Cup -- Oral - Nectar Teaspoon -- Oral - Nectar Cup WFL Oral - Nectar Straw -- Oral - Thin Teaspoon WFL Oral - Thin Cup WFL Oral - Thin Straw WFL Oral - Puree NT Oral - Mech Soft -- Oral - Regular WFL Oral - Multi-Consistency -- Oral - Pill -- Oral Phase - Comment --  CHL IP PHARYNGEAL PHASE 04/15/2018 Pharyngeal Phase Impaired Pharyngeal- Pudding Teaspoon -- Pharyngeal -- Pharyngeal- Pudding Cup -- Pharyngeal -- Pharyngeal- Honey Teaspoon -- Pharyngeal -- Pharyngeal- Honey Cup -- Pharyngeal -- Pharyngeal- Nectar Teaspoon -- Pharyngeal -- Pharyngeal- Nectar Cup Penetration/Aspiration during swallow;WFL Pharyngeal -- Pharyngeal- Nectar Straw -- Pharyngeal -- Pharyngeal- Thin Teaspoon WFL Pharyngeal -- Pharyngeal- Thin Cup Reduced airway/laryngeal closure;Penetration/Aspiration during swallow Pharyngeal Material enters airway, passes BELOW cords without attempt by patient to eject out (silent aspiration) Pharyngeal- Thin Straw Penetration/Apiration after swallow Pharyngeal Material enters airway, passes BELOW cords without attempt by patient to eject out (silent aspiration);Material enters airway, passes BELOW cords and not ejected out despite cough attempt by patient Pharyngeal- Puree NT Pharyngeal -- Pharyngeal- Mechanical Soft -- Pharyngeal --  Pharyngeal- Regular WFL Pharyngeal -- Pharyngeal- Multi-consistency -- Pharyngeal -- Pharyngeal- Pill -- Pharyngeal -- Pharyngeal Comment chin tuck helpful to prevent aspiration - trace penetration only which cleared  CHL IP CERVICAL ESOPHAGEAL PHASE 04/15/2018 Cervical Esophageal Phase WFL Pudding Teaspoon -- Pudding Cup -- Honey Teaspoon -- Honey Cup -- Nectar Teaspoon -- Nectar Cup -- Nectar Straw -- Thin Teaspoon -- Thin Cup -- Thin Straw -- Puree -- Mechanical Soft -- Regular -- Multi-consistency -- Pill -- Cervical Esophageal Comment -- Macario Golds 04/15/2018, 2:18 PM    Luanna Salk, MS [facility] SLP Acute Rehab Services Pager [phone] Office [phone]           Dg Esophagus Inc Scout Chest & Delayed Img Double Cm  Result Date: 04/16/2018 CLINICAL DATA:  78 year old female with dysphagia nausea and vomiting. Metastatic breast cancer. Subsequent encounter. EXAM: ESOPHOGRAM/BARIUM SWALLOW TECHNIQUE: Single contrast examination was performed using  thin barium. FLUOROSCOPY TIME:  Fluoroscopy Time:  1 minutes and 6 seconds Radiation Exposure Index: 6.5 mGy COMPARISON:  04/15/2018 speech swallow.  04/13/2018 chest CT. FINDINGS: Exam was tailored to the patient. Single contrast exam performed with thin barium. No laryngeal aspiration occurred with chin tuck position. Slightly blunted primary esophageal stripping wave. No esophageal obstructing lesion. Mild smooth narrowing of the distal esophagus just above small hiatal hernia. 2 cm proximal to this level, there is a small circumferential esophageal stricture. The esophagus just above this level has a slightly lobulated appearance left lateral aspect. Cause of strictures indeterminate. Retained secretions limited evaluation esophageal mucosa. Patient attempted but was not able to swallow barium tablet. IMPRESSION: 1. Exam was tailored to the patient. 2. No laryngeal aspiration occurred with chin tuck position. 3. Slightly blunted primary esophageal  stripping wave. 4. No esophageal obstructing lesion. 5. Mild smooth narrowing of the distal esophagus just above small hiatal hernia. 2 cm proximal to this level, there is a small circumferential esophageal stricture. The esophagus just above this stricture has a slightly lobulated appearance along left lateral aspect. Cause of stricture is indeterminate. 6. Retained secretions limited evaluation of esophageal mucosa. 7. Patient attempted but was  not able to swallow barium tablet (poor oropharyngeal phase). Electronically Signed   By: Genia Del M.D.   On: 04/16/2018 10:14        Scheduled Meds: . acetaminophen  1,000 mg Oral On Call to OR  . bisacodyl  10 mg Rectal Daily  . fluticasone  2 spray Each Nare Daily  . gabapentin  300 mg Oral On Call to OR  . guaiFENesin  1,200 mg Oral BID  . loratadine  10 mg Oral Daily  . pantoprazole (PROTONIX) IV  40 mg Intravenous Q12H  . polyethylene glycol  34 g Oral BID  . potassium chloride  40 mEq Oral Q4H  . senna-docusate  1 tablet Oral BID   Continuous Infusions: . sodium chloride 100 mL/hr at 04/16/18 0330  .  ceFAZolin (ANCEF) IV    . dextrose 5 % and 0.45 % NaCl with KCl 10 mEq/L 75 mL/hr at 04/14/18 0300     LOS: 3 days    Time spent: 40 minutes    Irine Seal, MD [facility]  If 7PM-7AM, please contact night-coverage [email or web address] Password [facility] 04/16/2018, 10:49 AM

## 2018-04-16 NOTE — Clinical Social Work Note (Signed)
Clinical Social Work Assessment  Patient Details  Name: Morgan Morrow MRN: [id number] Date of Birth: [birth year]  Date of referral:  04/16/18               Reason for consult:  Discharge Planning                Permission sought to share information with:  Family Supports Permission granted to share information::  Yes, Verbal Permission Granted  Name::     daughter [name]  Agency::     Relationship::     Contact Information:     Housing/Transportation Living arrangements for the past 2 months:  Tax adviser of Information:  Scientist, water quality, Adult Children Patient Interpreter Needed:  None Criminal Activity/Legal Involvement Pertinent to Current Situation/Hospitalization:  No - Comment as needed Significant Relationships:  Adult Children Lives with:  Self Do you feel safe going back to the place where you live?  No Need for family participation in patient care:  Yes (Comment)(daughter involved)  Care giving concerns:  Pt admitted from home where she resides alone, has been requiring assistance with ambulating and ADLs for some time per daughter. "She was getting around a little bit but it' been a while since she was up and about." Daughter states pt has been having diarrhea in past couple of weeks. Reports over past several months she lost weight, started having difficulty swallowing, and getting weaker and sometimes confused. Admitted for work up of breast cancer with likely metastasis   Social Worker assessment / plan:  CSW consulted to assist with disposition - SNF recommended. Pt in surgery for biopsy of breast and scalp today, however CSW met and discussed with daughter. Described overview of SNF placement process including prior insurance auth needed.  Daughter receptive, explains that as pt's work up is in progress she feels they want to find out more about pt's prognosis and what if any treatment plan will be. Considering SNF but also want to have holistic picture of what pt's  plan will be. CSW will meet with pt and daughter again to continue planning.  Employment status:  Retired Engineer, maintenance (IT)) PT Recommendations:  [place] / Referral to community resources:     Patient/Family's Response to care:  appreciative  Patient/Family's Understanding of and Emotional Response to Diagnosis, Current Treatment, and Prognosis:  Pt in procedure- daughter shows good understanding by asking pertinent questions and seems emotionally pleasant and appropriate to situation  Emotional Assessment Appearance:    Attitude/Demeanor/Rapport:    Affect (typically observed):    Orientation:    Alcohol / Substance use:    Psych involvement (Current and /or in the community):     Discharge Needs  Concerns to be addressed:  Adjustment to Illness, Decision making concerns, Discharge Planning Concerns Readmission within the last 30 days:  No Current discharge risk:  (assessing) Barriers to Discharge:  Continued Medical Work up   [employer], LCSW 04/16/2018, 2:10 PM [phone]

## 2018-04-16 NOTE — Evaluation (Addendum)
Physical Therapy Evaluation Patient Details Name: Morgan Morrow MRN: [id number] DOB: [birth year] Today's Date: 04/16/2018   History of Present Illness  78 yo female adm to [facility] on 04/14/11 with wt loss, diarrhea, health declining over last few years per family; breast mass with numerous subcutaneous and other lesions, probably metastatic breast cancer, to go for bx today;  Clinical Impression  Pt admitted with above diagnosis. Pt currently with functional limitations due to the deficits listed below (see PT Problem List).  Pt is extremely weak and  Deconditioned with multiple medical issues, fatigues after amb ~ 20' with RW, requiring min assist overall; recommend SNF post acute, pt is beginning w/u for metastatic CA; will continue to follow for needs; pt family is supportive and agreeable to rehab post acute;   Pt will benefit from skilled PT to increase their independence and safety with mobility to allow discharge to the venue listed below.       Follow Up Recommendations SNF;Supervision/Assistance - 24 hour    Equipment Recommendations  None recommended by PT(defer to SNF)    Recommendations for Other Services       Precautions / Restrictions Precautions Precautions: Fall Restrictions Weight Bearing Restrictions: No      Mobility  Bed Mobility Overal bed mobility: Needs Assistance Bed Mobility: Supine to Sit     Supine to sit: Min assist     General bed mobility comments: assist with LEs   Transfers Overall transfer level: Needs assistance Equipment used: None Transfers: Sit to/from Stand Sit to Stand: Min guard;Min assist         General transfer comment: unsteady on initial stand, needing min assist for balance  Ambulation/Gait Ambulation/Gait assistance: Min assist Gait Distance (Feet): 20 Feet Assistive device: Rolling walker (2 wheeled) Gait Pattern/deviations: Step-through pattern;Decreased stride length     General Gait Details: min assist for balance  and maneuvering RW, unsteady but no overt LOB, fatigues quickly  Stairs            Wheelchair Mobility    Modified Rankin (Stroke Patients Only)       Balance Overall balance assessment: Needs assistance Sitting-balance support: No upper extremity supported;Feet supported Sitting balance-Leahy Scale: Fair       Standing balance-Leahy Scale: Poor                               Pertinent Vitals/Pain      Home Living Family/patient expects to be discharged to:: Private residence Living Arrangements: Alone Available Help at Discharge: Family(local son adn dtr) Type of Home: Apartment Home Access: Stairs to enter   Entrance Stairs-Number of Steps: 5 Home Layout: One level Home Equipment: None  Pt amb with swiffer at home per family, dtr report she had gotten a cane for her to use but does not think she was compliant      Prior Function Level of Independence: Independent         Comments: sponge bathes     Hand Dominance        Extremity/Trunk Assessment   Upper Extremity Assessment Upper Extremity Assessment: Defer to OT evaluation    Lower Extremity Assessment Lower Extremity Assessment: Generalized weakness       Communication   Communication: No difficulties  Cognition Arousal/Alertness: Awake/alert Behavior During Therapy: Flat affect Overall Cognitive Status: Within Functional Limits for tasks assessed  General Comments      Exercises     Assessment/Plan    PT Assessment Patient needs continued PT services  PT Problem List Decreased strength;Decreased activity tolerance;Decreased mobility;Decreased balance;Decreased knowledge of use of DME       PT Treatment Interventions DME instruction;Functional mobility training;Patient/family education;Gait training;Therapeutic activities;Therapeutic exercise    PT Goals (Current goals can be found in the Care Plan section)   Acute Rehab PT Goals Patient Stated Goal: none stated per pt; per family, for pt to get care she needs, go to rehab PT Goal Formulation: With patient/family Time For Goal Achievement: 04/23/18 Potential to Achieve Goals: Fair    Frequency Min 2X/week   Barriers to discharge        Co-evaluation               AM-PAC PT "6 Clicks" Mobility  Outcome Measure Help needed turning from your back to your side while in a flat bed without using bedrails?: A Little Help needed moving from lying on your back to sitting on the side of a flat bed without using bedrails?: A Little Help needed moving to and from a bed to a chair (including a wheelchair)?: A Little Help needed standing up from a chair using your arms (e.g., wheelchair or bedside chair)?: A Little Help needed to walk in hospital room?: A Little   6 Click Score: 15    End of Session Equipment Utilized During Treatment: Gait belt Activity Tolerance: Patient limited by fatigue Patient left: in chair;with call bell/phone within reach;with chair alarm set;with family/visitor present   PT Visit Diagnosis: Unsteadiness on feet (R26.81)    Time: 6440-3474 PT Time Calculation (min) (ACUTE ONLY): 25 min   Charges:   PT Evaluation $PT Eval Low Complexity: 1 Low PT Treatments $Gait Training: 8-22 mins        Kenyon Ana, PT  Pager: [phone] Acute Rehab Dept [facility]): [phone]   04/16/2018   [facility] 04/16/2018, 10:40 AM

## 2018-04-16 NOTE — H&P (View-Only) (Signed)
[facility] Progress Note     Subjective: CC-  Several family members at bedside.  No new complaints. Wants to get surgery over with.  Objective: Vital signs in last 24 hours: Temp:  [98.3 F (36.8 C)-99 F (37.2 C)] 98.3 F (36.8 C) (01/13 0540) Pulse Rate:  [90-93] 90 (01/13 0540) Resp:  [19-20] 19 (01/13 0540) BP: (118-130)/(60-84) 130/73 (01/13 0540) SpO2:  [95 %-100 %] 97 % (01/13 0540) Last BM Date: 04/15/18  Intake/Output from previous day: 01/12 0701 - 01/13 0700 In: 320 [P.O.:320] Out: 400 [Urine:400] Intake/Output this shift: No intake/output data recorded.  PE: Gen:  Alert, frail, NAD HEENT: EOM's intact, pupils equal and round Card:  RRR, +murmur Breast: Right breast with about 7x7cm large erythematous hard lesion around nipple Pulm:  effort normal Abd: Soft, NT/ND, +BS, no HSM Skin: warm and dry  Lab Results:  Recent Labs    04/15/18 0544 04/16/18 0617  WBC 4.4 5.1  HGB 9.0* 8.6*  HCT 29.1* 27.8*  PLT 182 171   BMET Recent Labs    04/15/18 0544 04/16/18 0617  NA 142 142  K 3.6 3.2*  CL 110 111  CO2 23 22  GLUCOSE 72 84  BUN 33* 29*  CREATININE 0.97 0.82  CALCIUM 8.8* 8.4*   PT/INR Recent Labs    04/14/18 0656  LABPROT 12.8  INR 0.97   CMP     Component Value Date/Time   NA 142 04/16/2018 0617   K 3.2 (L) 04/16/2018 0617   CL 111 04/16/2018 0617   CO2 22 04/16/2018 0617   GLUCOSE 84 04/16/2018 0617   BUN 29 (H) 04/16/2018 0617   CREATININE 0.82 04/16/2018 0617   CALCIUM 8.4 (L) 04/16/2018 0617   PROT 5.4 (L) 04/15/2018 0544   ALBUMIN 2.4 (L) 04/15/2018 0544   AST 125 (H) 04/15/2018 0544   ALT 28 04/15/2018 0544   ALKPHOS 310 (H) 04/15/2018 0544   BILITOT 0.6 04/15/2018 0544   GFRNONAA >60 04/16/2018 0617   GFRAA >60 04/16/2018 0617   Lipase     Component Value Date/Time   LIPASE 74 (H) 04/15/2018 0544       Studies/Results: Ct Head W & Wo Contrast  Result Date: 04/14/2018 CLINICAL DATA:   Progressive weakness and weight loss for several months. Diarrhea. Liver and bone metastases. Unknown primary. Bilateral pulmonary nodules. EXAM: CT HEAD WITHOUT AND WITH CONTRAST TECHNIQUE: Contiguous axial images were obtained from the base of the skull through the vertex without and with intravenous contrast CONTRAST:  34m OMNIPAQUE IOHEXOL 300 MG/ML  SOLN COMPARISON:  None. FINDINGS: Brain: Mild atrophy and moderate diffuse white matter disease is present. There is no pathologic enhancement to suggest metastatic disease to the brain. The ventricles are of proportionate to the degree of atrophy. Remote lacunar infarcts are present in the basal ganglia bilaterally. Brainstem and cerebellum are normal. Vascular: Atherosclerotic calcifications are present within the cavernous internal carotid arteries and at the dural margin the left vertebral artery. There is no hyperdense vessel. Skull: Scattered lytic lesions present throughout the calvarium compatible with known bone metastases. No expansile lesions are present. An indeterminate scalp soft tissue lesion over the left parietal skull near the vertex measures 2.5 x 2.4 x 0.6 cm. There is diffuse infiltration of the calvarium subjacent to this area. Sinuses/Orbits: The paranasal sinuses and mastoid air cells are clear. The globes and orbits are within normal limits. IMPRESSION: 1. No evidence for metastatic disease to brain. 2. Extensive osseous metastases  throughout the skull. 3. Focal soft tissue in the high left parietal scalp. This could represent a metastatic deposit. Electronically Signed   By: San Morelle M.D.   On: 04/14/2018 12:45   Ct Chest W Contrast  Result Date: 04/14/2018 CLINICAL DATA:  Right middle lobe pneumonia. EXAM: CT CHEST WITH CONTRAST TECHNIQUE: Multidetector CT imaging of the chest was performed during intravenous contrast administration. CONTRAST:  25m OMNIPAQUE IOHEXOL 300 MG/ML  SOLN COMPARISON:  Chest x-ray 04/13/2018  FINDINGS: Cardiovascular: Heart size upper normal. No substantial pericardial effusion. Coronary artery calcification is evident. Atherosclerotic calcification is noted in the wall of the thoracic aorta. Ascending thoracic aorta measures 3.8 cm diameter. Mediastinum/Nodes: Soft tissue attenuation is identified in the AP window without a discrete lymph node. Small prevascular lymph nodes are so seated with small lymph nodes in each hilar region. Circumferential wall thickening is noted in the distal esophagus and fluid within the esophageal lumen is compatible with reflux or dysmotility. There is no axillary lymphadenopathy. Lungs/Pleura: The central tracheobronchial airways are patent. Numerous ill-defined pulmonary nodules are scattered through both lungs. Index nodule medial right upper lobe (45/11) measures 5 mm. Index right lower lobe nodule seen posteriorly on 102/11 measures 8 mm. Representative peripheral left lower lobe nodule (83/11) measures 6 mm. Small bilateral pleural effusions evident. Upper Abdomen: 11 mm low-density lesion in the liver, better characterized on yesterday's abdomen CT. Additional small low-density lesions in the liver. Calcified gallstones evident. Small cyst noted right kidney. Musculoskeletal: Widespread bony metastatic involvement is evident. 7.7 x 6.8 x 2.0 cm mass identified in the right breast. IMPRESSION: 1. No evidence for right middle lobe pneumonia. Opacity seen over the right breast on recent chest x-ray likely represents the patient's known large right breast mass. 2. Numerous ill-defined pulmonary nodules scattered through both lungs, highly suspicious for metastatic disease. 3. Widespread bony metastatic involvement. 4. Hypodense liver lesions concerning for metastatic involvement. 5. Small bilateral pleural effusions. 6.  Aortic Atherosclerois (ICD10-170.0) Electronically Signed   By: EMisty StanleyM.D.   On: 04/14/2018 13:52   Dg Swallowing Func-speech  Pathology  Result Date: 04/15/2018 Objective Swallowing Evaluation: Type of Study: MBS-Modified Barium Swallow Study  Patient Details Name: Morgan ZOLLARSMRN: [id number] of Birth: 6Feb 06, 1942Today's Date: 04/15/2018 Time: SLP Start Time (ACUTE ONLY): 18841-SLP Stop Time (ACUTE ONLY): 1350 SLP Time Calculation (min) (ACUTE ONLY): 15 min Past Medical History: Past Medical History: Diagnosis Date . Allergy  . Hypertension  Past Surgical History: No past surgical history on file. HPI: 78yo patient referred for MBS due to pt having dysphagia.  Pt admitted with cachexia, stool burden, breast and scalp mass - likely metastatic breast cancer.  She underwent clinical evaluation of swallow yesterday and was placed on honey thick liquids and regular foods.  MBS indicated.  Reports voice became hoarse last April - denies significant issues with reflux.   Subjective: The patient was seen sitting upright in chair Assessment / Plan / Recommendation CHL IP CLINICAL IMPRESSIONS 04/15/2018 Clinical Impression Patient presents with mild pharyngeal dysphagia with decreased timing and adequacy of laryngeal elevation/closure causing minimal aspiration of thin with head neutral.  Aspiration was silent when trace but audible with cough response when mild. Chin tuck posture with pt fully upright and small boluses, airway protection intact.   Pharyngeal swallow is strong without significant residuals with thin, nectar, cracker - Limited amount of barium pt was provided due to pt's bowel issue.  Recommend advance diet to regular/thin with  chin tuck posture.  Educated pt to aspiration precautions and mitigation strategies using teach back.   SLP Visit Diagnosis Dysphagia, pharyngeal phase (R13.13) Attention and concentration deficit following -- Frontal lobe and executive function deficit following -- Impact on safety and function Mild aspiration risk   CHL IP TREATMENT RECOMMENDATION 04/15/2018 Treatment Recommendations Therapy as outlined  in treatment plan below   Prognosis 04/15/2018 Prognosis for Safe Diet Advancement Fair Barriers to Reach Goals Other (Comment) Barriers/Prognosis Comment -- CHL IP DIET RECOMMENDATION 04/15/2018 SLP Diet Recommendations Regular solids;Thin liquid Liquid Administration via Straw Medication Administration Whole meds with puree Compensations Slow rate;Small sips/bites;Chin tuck Postural Changes Remain semi-upright after after feeds/meals (Comment);Seated upright at 90 degrees   CHL IP OTHER RECOMMENDATIONS 04/15/2018 Recommended Consults Other (Comment) Oral Care Recommendations Oral care QID Other Recommendations --   CHL IP FOLLOW UP RECOMMENDATIONS 04/14/2018 Follow up Recommendations Other (comment)   CHL IP FREQUENCY AND DURATION 04/15/2018 Speech Therapy Frequency (ACUTE ONLY) min 2x/week Treatment Duration 2 weeks      CHL IP ORAL PHASE 04/15/2018 Oral Phase Impaired Oral - Pudding Teaspoon -- Oral - Pudding Cup -- Oral - Honey Teaspoon -- Oral - Honey Cup -- Oral - Nectar Teaspoon -- Oral - Nectar Cup WFL Oral - Nectar Straw -- Oral - Thin Teaspoon WFL Oral - Thin Cup WFL Oral - Thin Straw WFL Oral - Puree NT Oral - Mech Soft -- Oral - Regular WFL Oral - Multi-Consistency -- Oral - Pill -- Oral Phase - Comment --  CHL IP PHARYNGEAL PHASE 04/15/2018 Pharyngeal Phase Impaired Pharyngeal- Pudding Teaspoon -- Pharyngeal -- Pharyngeal- Pudding Cup -- Pharyngeal -- Pharyngeal- Honey Teaspoon -- Pharyngeal -- Pharyngeal- Honey Cup -- Pharyngeal -- Pharyngeal- Nectar Teaspoon -- Pharyngeal -- Pharyngeal- Nectar Cup Penetration/Aspiration during swallow;WFL Pharyngeal -- Pharyngeal- Nectar Straw -- Pharyngeal -- Pharyngeal- Thin Teaspoon WFL Pharyngeal -- Pharyngeal- Thin Cup Reduced airway/laryngeal closure;Penetration/Aspiration during swallow Pharyngeal Material enters airway, passes BELOW cords without attempt by patient to eject out (silent aspiration) Pharyngeal- Thin Straw Penetration/Apiration after swallow Pharyngeal  Material enters airway, passes BELOW cords without attempt by patient to eject out (silent aspiration);Material enters airway, passes BELOW cords and not ejected out despite cough attempt by patient Pharyngeal- Puree NT Pharyngeal -- Pharyngeal- Mechanical Soft -- Pharyngeal -- Pharyngeal- Regular WFL Pharyngeal -- Pharyngeal- Multi-consistency -- Pharyngeal -- Pharyngeal- Pill -- Pharyngeal -- Pharyngeal Comment chin tuck helpful to prevent aspiration - trace penetration only which cleared  CHL IP CERVICAL ESOPHAGEAL PHASE 04/15/2018 Cervical Esophageal Phase WFL Pudding Teaspoon -- Pudding Cup -- Honey Teaspoon -- Honey Cup -- Nectar Teaspoon -- Nectar Cup -- Nectar Straw -- Thin Teaspoon -- Thin Cup -- Thin Straw -- Puree -- Mechanical Soft -- Regular -- Multi-consistency -- Pill -- Cervical Esophageal Comment -- Macario Golds 04/15/2018, 2:18 PM    Luanna Salk, MS [facility] SLP Acute Rehab Services Pager [phone] Office [phone]            Anti-infectives: Anti-infectives (From admission, onward)   Start     Dose/Rate Route Frequency Ordered Stop   04/16/18 0600  ceFAZolin (ANCEF) IVPB 2g/100 mL premix     2 g 200 mL/hr over 30 Minutes Intravenous On call to O.R. 04/15/18 2104 04/17/18 0559   04/13/18 2230  azithromycin (ZITHROMAX) 500 mg in sodium chloride 0.9 % 250 mL IVPB  Status:  Discontinued     500 mg 250 mL/hr over 60 Minutes Intravenous Every 24 hours 04/13/18 2130 04/15/18 1556   04/13/18  2200  doxycycline (VIBRA-TABS) tablet 100 mg  Status:  Discontinued     100 mg Oral Every 12 hours 04/13/18 1928 04/13/18 2130   04/13/18 1930  cefTRIAXone (ROCEPHIN) 1 g in sodium chloride 0.9 % 100 mL IVPB  Status:  Discontinued     1 g 200 mL/hr over 30 Minutes Intravenous Every 24 hours 04/13/18 1928 04/15/18 1556       Assessment/Plan FTT Dysphagia - barium swallow with ST pending Murmur - ECHO pending Anemia  Breast mass with numerous subcutaneous and other lesions, probably  metastatic breast cancer - OR today for breast biopsy and scalp biopsy. Keep NPO. - oncology following   ID - none currently FEN - IVF, NPO VTE - SCDs Foley - none   LOS: 3 days    Wellington Hampshire , [facility] 04/16/2018, 9:43 AM Pager: [phone] Mon 7:00 am -11:30 AM Tues-Fri 7:00 am-4:30 pm Sat-Sun 7:00 am-11:30 am

## 2018-04-16 NOTE — Op Note (Signed)
JOURI THREAT  05-Sep-1940 16 Apr 2018    PCP:  Leighton Ruff, MD   Surgeon: Kaylyn Lim, MD, FACS  Asst:  none  Anes:  Local MAC  Preop Dx: Metastatic  Breast cancer Postop Dx: same  Procedure: Core biopsies of the breast and two scalp lesions Location Surgery: [facility] 4 Complications: None noted  EBL:   minimal cc  Drains: none  Description of Procedure:  The patient was taken to OR 4 .  After anesthesia was administered and the patient was prepped  with [employer] and a timeout was performed.  The right breast is completely infiltrated with tumor with loss of skin with surviving areola.  A site was selected and infiltrated with lidocaine.  A core biopsy device was inserted into this whitish, firm tumor and a core obtained.    Next the two scalp lesion were independently prepped and infiltrated with lidocaine.  Separate core devices were used to obtain samples from a nodule on the right posterior occiput that was moveable and from a broader mass on the top/back of the head that was more firm and fixed.  Minimal bleeding occurred.  Wounds were dressed.    The patient tolerated the procedure well and was taken to the PACU in stable condition.     Matt B. Hassell Done, [place], [facility], [place]

## 2018-04-17 ENCOUNTER — Encounter (HOSPITAL_COMMUNITY): Payer: Self-pay | Admitting: Surgery

## 2018-04-17 DIAGNOSIS — R220 Localized swelling, mass and lump, head: Secondary | ICD-10-CM

## 2018-04-17 DIAGNOSIS — Z170 Estrogen receptor positive status [ER+]: Secondary | ICD-10-CM

## 2018-04-17 DIAGNOSIS — R64 Cachexia: Secondary | ICD-10-CM

## 2018-04-17 DIAGNOSIS — R1310 Dysphagia, unspecified: Secondary | ICD-10-CM

## 2018-04-17 DIAGNOSIS — K5909 Other constipation: Secondary | ICD-10-CM

## 2018-04-17 DIAGNOSIS — E440 Moderate protein-calorie malnutrition: Secondary | ICD-10-CM

## 2018-04-17 LAB — CBC WITH DIFFERENTIAL/PLATELET
Abs Immature Granulocytes: 0.12 10*3/uL — ABNORMAL HIGH (ref 0.00–0.07)
Basophils Absolute: 0 10*3/uL (ref 0.0–0.1)
Basophils Relative: 0 %
Eosinophils Absolute: 0.1 10*3/uL (ref 0.0–0.5)
Eosinophils Relative: 1 %
HEMATOCRIT: 28.4 % — AB (ref 36.0–46.0)
Hemoglobin: 8.7 g/dL — ABNORMAL LOW (ref 12.0–15.0)
Immature Granulocytes: 2 %
LYMPHS ABS: 0.8 10*3/uL (ref 0.7–4.0)
Lymphocytes Relative: 15 %
MCH: 30.1 pg (ref 26.0–34.0)
MCHC: 30.6 g/dL (ref 30.0–36.0)
MCV: 98.3 fL (ref 80.0–100.0)
MONO ABS: 0.5 10*3/uL (ref 0.1–1.0)
MONOS PCT: 8 %
Neutro Abs: 4.1 10*3/uL (ref 1.7–7.7)
Neutrophils Relative %: 74 %
Platelets: 172 10*3/uL (ref 150–400)
RBC: 2.89 MIL/uL — ABNORMAL LOW (ref 3.87–5.11)
RDW: 13.9 % (ref 11.5–15.5)
WBC: 5.6 10*3/uL (ref 4.0–10.5)
nRBC: 0 % (ref 0.0–0.2)

## 2018-04-17 LAB — BASIC METABOLIC PANEL
Anion gap: 10 (ref 5–15)
BUN: 33 mg/dL — ABNORMAL HIGH (ref 8–23)
CO2: 23 mmol/L (ref 22–32)
Calcium: 8.5 mg/dL — ABNORMAL LOW (ref 8.9–10.3)
Chloride: 108 mmol/L (ref 98–111)
Creatinine, Ser: 0.99 mg/dL (ref 0.44–1.00)
GFR calc Af Amer: >60 mL/min (ref >60)
GFR calc non Af Amer: 54 mL/min — ABNORMAL LOW (ref >60)
GLUCOSE: 54 mg/dL — AB (ref 70–99)
Potassium: 3.5 mmol/L (ref 3.5–5.1)
Sodium: 141 mmol/L (ref 135–145)

## 2018-04-17 LAB — CANCER ANTIGEN 27.29: CAN 27.29: 1174.1 U/mL — AB (ref 0.0–38.6)

## 2018-04-17 MED ORDER — SIMETHICONE 80 MG PO CHEW
160.0000 mg | CHEWABLE_TABLET | Freq: Four times a day (QID) | ORAL | Status: DC
  Administered 2018-04-17 – 2018-04-20 (×9): 160 mg via ORAL
  Filled 2018-04-17 (×9): qty 2

## 2018-04-17 MED ORDER — TRAMADOL HCL 50 MG PO TABS
50.0000 mg | ORAL_TABLET | Freq: Four times a day (QID) | ORAL | Status: DC | PRN
  Administered 2018-04-17 – 2018-04-20 (×5): 50 mg via ORAL
  Filled 2018-04-17 (×6): qty 1

## 2018-04-17 MED ORDER — ENSURE ENLIVE PO LIQD
237.0000 mL | Freq: Two times a day (BID) | ORAL | Status: DC
  Administered 2018-04-18 – 2018-04-19 (×3): 237 mL via ORAL

## 2018-04-17 MED ORDER — ANASTROZOLE 1 MG PO TABS
1.0000 mg | ORAL_TABLET | Freq: Every day | ORAL | Status: DC
  Administered 2018-04-17 – 2018-04-20 (×4): 1 mg via ORAL
  Filled 2018-04-17 (×4): qty 1

## 2018-04-17 NOTE — Progress Notes (Signed)
[facility] Progress Note  1 Day Post-Op  Subjective: CC-  No complaints this morning. Sitting up eating breakfast.  Objective: Vital signs in last 24 hours: Temp:  [97.7 F (36.5 C)-98.3 F (36.8 C)] 98.1 F (36.7 C) (01/14 0600) Pulse Rate:  [72-84] 81 (01/14 0600) Resp:  [11-22] 18 (01/14 0600) BP: (109-133)/(58-102) 112/63 (01/14 0600) SpO2:  [92 %-100 %] 92 % (01/14 0600) Last BM Date: 04/15/18  Intake/Output from previous day: 01/13 0701 - 01/14 0700 In: 2089.4 [I.V.:2089.4] Out: -  Intake/Output this shift: No intake/output data recorded.  PE: Gen:  Alert, frail, NAD HEENT: EOM's intact, pupils equal and round, 2 scalp lesions with biopsy site clean/no drainage or erythema Breast: Right breast with about 7x7cm large erythematous hard lesion around nipple, biopsy clean without any active drainage Pulm:  effort normal Skin: warm and dry  Lab Results:  Recent Labs    04/15/18 0544 04/16/18 0617  WBC 4.4 5.1  HGB 9.0* 8.6*  HCT 29.1* 27.8*  PLT 182 171   BMET Recent Labs    04/15/18 0544 04/16/18 0617  NA 142 142  K 3.6 3.2*  CL 110 111  CO2 23 22  GLUCOSE 72 84  BUN 33* 29*  CREATININE 0.97 0.82  CALCIUM 8.8* 8.4*   PT/INR No results for input(s): LABPROT, INR in the last 72 hours. CMP     Component Value Date/Time   NA 142 04/16/2018 0617   K 3.2 (L) 04/16/2018 0617   CL 111 04/16/2018 0617   CO2 22 04/16/2018 0617   GLUCOSE 84 04/16/2018 0617   BUN 29 (H) 04/16/2018 0617   CREATININE 0.82 04/16/2018 0617   CALCIUM 8.4 (L) 04/16/2018 0617   PROT 5.4 (L) 04/15/2018 0544   ALBUMIN 2.4 (L) 04/15/2018 0544   AST 125 (H) 04/15/2018 0544   ALT 28 04/15/2018 0544   ALKPHOS 310 (H) 04/15/2018 0544   BILITOT 0.6 04/15/2018 0544   GFRNONAA >60 04/16/2018 0617   GFRAA >60 04/16/2018 0617   Lipase     Component Value Date/Time   LIPASE 74 (H) 04/15/2018 0544       Studies/Results: Dg Swallowing Func-speech  Pathology  Result Date: 04/15/2018 Objective Swallowing Evaluation: Type of Study: MBS-Modified Barium Swallow Study  Patient Details Name: Morgan Morrow MRN: [id number] Date of Birth: [birth year] Today's Date: 04/15/2018 Time: SLP Start Time (ACUTE ONLY): 1335 -SLP Stop Time (ACUTE ONLY): 1350 SLP Time Calculation (min) (ACUTE ONLY): 15 min Past Medical History: Past Medical History: Diagnosis Date . Allergy  . Hypertension  Past Surgical History: No past surgical history on file. HPI: 78 yo patient referred for MBS due to pt having dysphagia.  Pt admitted with cachexia, stool burden, breast and scalp mass - likely metastatic breast cancer.  She underwent clinical evaluation of swallow yesterday and was placed on honey thick liquids and regular foods.  MBS indicated.  Reports voice became hoarse last April - denies significant issues with reflux.   Subjective: The patient was seen sitting upright in chair Assessment / Plan / Recommendation CHL IP CLINICAL IMPRESSIONS 04/15/2018 Clinical Impression Patient presents with mild pharyngeal dysphagia with decreased timing and adequacy of laryngeal elevation/closure causing minimal aspiration of thin with head neutral.  Aspiration was silent when trace but audible with cough response when mild. Chin tuck posture with pt fully upright and small boluses, airway protection intact.   Pharyngeal swallow is strong without significant residuals with thin, nectar, cracker - Limited amount of barium  pt was provided due to pt's bowel issue.  Recommend advance diet to regular/thin with chin tuck posture.  Educated pt to aspiration precautions and mitigation strategies using teach back.   SLP Visit Diagnosis Dysphagia, pharyngeal phase (R13.13) Attention and concentration deficit following -- Frontal lobe and executive function deficit following -- Impact on safety and function Mild aspiration risk   CHL IP TREATMENT RECOMMENDATION 04/15/2018 Treatment Recommendations Therapy as outlined  in treatment plan below   Prognosis 04/15/2018 Prognosis for Safe Diet Advancement Fair Barriers to Reach Goals Other (Comment) Barriers/Prognosis Comment -- CHL IP DIET RECOMMENDATION 04/15/2018 SLP Diet Recommendations Regular solids;Thin liquid Liquid Administration via Straw Medication Administration Whole meds with puree Compensations Slow rate;Small sips/bites;Chin tuck Postural Changes Remain semi-upright after after feeds/meals (Comment);Seated upright at 90 degrees   CHL IP OTHER RECOMMENDATIONS 04/15/2018 Recommended Consults Other (Comment) Oral Care Recommendations Oral care QID Other Recommendations --   CHL IP FOLLOW UP RECOMMENDATIONS 04/14/2018 Follow up Recommendations Other (comment)   CHL IP FREQUENCY AND DURATION 04/15/2018 Speech Therapy Frequency (ACUTE ONLY) min 2x/week Treatment Duration 2 weeks      CHL IP ORAL PHASE 04/15/2018 Oral Phase Impaired Oral - Pudding Teaspoon -- Oral - Pudding Cup -- Oral - Honey Teaspoon -- Oral - Honey Cup -- Oral - Nectar Teaspoon -- Oral - Nectar Cup WFL Oral - Nectar Straw -- Oral - Thin Teaspoon WFL Oral - Thin Cup WFL Oral - Thin Straw WFL Oral - Puree NT Oral - Mech Soft -- Oral - Regular WFL Oral - Multi-Consistency -- Oral - Pill -- Oral Phase - Comment --  CHL IP PHARYNGEAL PHASE 04/15/2018 Pharyngeal Phase Impaired Pharyngeal- Pudding Teaspoon -- Pharyngeal -- Pharyngeal- Pudding Cup -- Pharyngeal -- Pharyngeal- Honey Teaspoon -- Pharyngeal -- Pharyngeal- Honey Cup -- Pharyngeal -- Pharyngeal- Nectar Teaspoon -- Pharyngeal -- Pharyngeal- Nectar Cup Penetration/Aspiration during swallow;WFL Pharyngeal -- Pharyngeal- Nectar Straw -- Pharyngeal -- Pharyngeal- Thin Teaspoon WFL Pharyngeal -- Pharyngeal- Thin Cup Reduced airway/laryngeal closure;Penetration/Aspiration during swallow Pharyngeal Material enters airway, passes BELOW cords without attempt by patient to eject out (silent aspiration) Pharyngeal- Thin Straw Penetration/Apiration after swallow Pharyngeal  Material enters airway, passes BELOW cords without attempt by patient to eject out (silent aspiration);Material enters airway, passes BELOW cords and not ejected out despite cough attempt by patient Pharyngeal- Puree NT Pharyngeal -- Pharyngeal- Mechanical Soft -- Pharyngeal -- Pharyngeal- Regular WFL Pharyngeal -- Pharyngeal- Multi-consistency -- Pharyngeal -- Pharyngeal- Pill -- Pharyngeal -- Pharyngeal Comment chin tuck helpful to prevent aspiration - trace penetration only which cleared  CHL IP CERVICAL ESOPHAGEAL PHASE 04/15/2018 Cervical Esophageal Phase WFL Pudding Teaspoon -- Pudding Cup -- Honey Teaspoon -- Honey Cup -- Nectar Teaspoon -- Nectar Cup -- Nectar Straw -- Thin Teaspoon -- Thin Cup -- Thin Straw -- Puree -- Mechanical Soft -- Regular -- Multi-consistency -- Pill -- Cervical Esophageal Comment -- Macario Golds 04/15/2018, 2:18 PM    Luanna Salk, MS [facility] SLP Acute Rehab Services Pager [phone] Office [phone]           Dg Esophagus Inc Scout Chest & Delayed Img Double Cm  Result Date: 04/16/2018 CLINICAL DATA:  78 year old female with dysphagia nausea and vomiting. Metastatic breast cancer. Subsequent encounter. EXAM: ESOPHOGRAM/BARIUM SWALLOW TECHNIQUE: Single contrast examination was performed using  thin barium. FLUOROSCOPY TIME:  Fluoroscopy Time:  1 minutes and 6 seconds Radiation Exposure Index: 6.5 mGy COMPARISON:  04/15/2018 speech swallow.  04/13/2018 chest CT. FINDINGS: Exam was tailored to the patient. Single contrast exam  performed with thin barium. No laryngeal aspiration occurred with chin tuck position. Slightly blunted primary esophageal stripping wave. No esophageal obstructing lesion. Mild smooth narrowing of the distal esophagus just above small hiatal hernia. 2 cm proximal to this level, there is a small circumferential esophageal stricture. The esophagus just above this level has a slightly lobulated appearance left lateral aspect. Cause of strictures  indeterminate. Retained secretions limited evaluation esophageal mucosa. Patient attempted but was not able to swallow barium tablet. IMPRESSION: 1. Exam was tailored to the patient. 2. No laryngeal aspiration occurred with chin tuck position. 3. Slightly blunted primary esophageal stripping wave. 4. No esophageal obstructing lesion. 5. Mild smooth narrowing of the distal esophagus just above small hiatal hernia. 2 cm proximal to this level, there is a small circumferential esophageal stricture. The esophagus just above this stricture has a slightly lobulated appearance along left lateral aspect. Cause of stricture is indeterminate. 6. Retained secretions limited evaluation of esophageal mucosa. 7. Patient attempted but was not able to swallow barium tablet (poor oropharyngeal phase). Electronically Signed   By: Genia Del M.D.   On: 04/16/2018 10:14    Anti-infectives: Anti-infectives (From admission, onward)   Start     Dose/Rate Route Frequency Ordered Stop   04/16/18 0600  ceFAZolin (ANCEF) IVPB 2g/100 mL premix  Status:  Discontinued     2 g 200 mL/hr over 30 Minutes Intravenous On call to O.R. 04/15/18 2104 04/16/18 1558   04/13/18 2230  azithromycin (ZITHROMAX) 500 mg in sodium chloride 0.9 % 250 mL IVPB  Status:  Discontinued     500 mg 250 mL/hr over 60 Minutes Intravenous Every 24 hours 04/13/18 2130 04/15/18 1556   04/13/18 2200  doxycycline (VIBRA-TABS) tablet 100 mg  Status:  Discontinued     100 mg Oral Every 12 hours 04/13/18 1928 04/13/18 2130   04/13/18 1930  cefTRIAXone (ROCEPHIN) 1 g in sodium chloride 0.9 % 100 mL IVPB  Status:  Discontinued     1 g 200 mL/hr over 30 Minutes Intravenous Every 24 hours 04/13/18 1928 04/15/18 1556       Assessment/Plan FTT Dysphagia - barium swallow with ST pending Murmur - ECHO 1/12 Anemia  Breast mass with numerous subcutaneous and other lesions, probably metastatic breast cancer - S/p Core biopsies of the breast and two scalp  lesions 1/13 Dr. Hassell Done - Biopsy sites clean without signs of infection. Change dressing daily and PRN until wound heals. Pathology pending. Further workup/treatment per primary and oncology. General surgery will sign off, please call with concerns.  ID - none currently FEN - IVF, reg diet VTE - SCDs Foley - none   LOS: 4 days    Wellington [place] , [facility] 04/17/2018, 8:46 AM Pager: [phone] Mon 7:00 am -11:30 AM Tues-Fri 7:00 am-4:30 pm Sat-Sun 7:00 am-11:30 am

## 2018-04-17 NOTE — NC FL2 (Signed)
[place] LEVEL OF CARE SCREENING TOOL     IDENTIFICATION  Patient Name: Morgan Morrow Birthdate: Nov 05, 1940 Sex: female Admission Date (Current Location): 04/13/2018  [facility] and [place] Number:  Herbalist and Address:  [facility],  [address], [place]      Provider Number: [id number]  Attending Physician Name and Address:  Eugenie Filler, MD  Relative Name and Phone Number:       Current Level of Care: Hospital Recommended Level of Care: [place] Prior Approval Number:    Date Approved/Denied: 04/17/18 PASRR Number: [id number]  Discharge Plan: SNF    Current Diagnoses: Patient Active Problem List   Diagnosis Date Noted  . Breast mass, probable cancer 04/15/2018  . Scalp mass, probable breast cancer metastasis 04/15/2018  . Stercoral proctocolitis of rectum 04/15/2018  . Metastatic breast cancer ([place]) 04/14/2018  . FTT (failure to thrive) in adult 04/14/2018  . Anemia 04/14/2018  . Murmur, cardiac 04/14/2018  . Diarrhea   . Fecal impaction in rectum ([place])   . Hypokalemia   . Cachexia ([place]) 04/13/2018  . Hematochezia 04/13/2018  . Community acquired pneumonia 04/13/2018  . Constipation, chronic 04/13/2018  . 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 Cough 11/01/2010  . HYPERGLYCEMIA 10/23/2009  . PAP SMEAR, ABNORMAL 02/10/2007  . HEMOCCULT POSITIVE STOOL 02/06/2007  . HYPERTENSION 10/23/2006  . ALLERGIC RHINITIS 10/23/2006    Orientation RESPIRATION BLADDER Height & Weight     Self, Time, Situation, Place  O2 Incontinent Weight: 100 lb (45.4 kg) Height:  4' 11"  (149.9 cm)  BEHAVIORAL SYMPTOMS/MOOD NEUROLOGICAL BOWEL NUTRITION STATUS      Incontinent Diet(See dc summary)  AMBULATORY STATUS COMMUNICATION OF NEEDS Skin   Extensive Assist Verbally Surgical wounds, PU Stage and Appropriate Care(Incision, Breast)   PU Stage 2 Dressing: (Sacral Medial Faom dressing)                    Personal Care Assistance Level of Assistance  Bathing, Feeding, Dressing Bathing Assistance: Maximum assistance Feeding assistance: Independent Dressing Assistance: Maximum assistance     Functional Limitations Info  Sight, Speech, Hearing Sight Info: Impaired(wears glasses) Hearing Info: Impaired Speech Info: Adequate    SPECIAL CARE FACTORS FREQUENCY  PT (By licensed PT), OT (By licensed OT)     PT Frequency: 5x/week OT Frequency: 5x/week            Contractures Contractures Info: Not present    Additional Factors Info  Code Status, Allergies Code Status Info: Full Allergies Info: NKA           Current Medications (04/17/2018):  This is the current hospital active medication list Current Facility-Administered Medications  Medication Dose Route Frequency Provider Last Rate Last Dose  . anastrozole (ARIMIDEX) tablet 1 mg  1 mg Oral Daily [name], [name], MD      . bisacodyl (DULCOLAX) suppository 10 mg  10 mg Rectal Daily Johnathan Hausen, MD   10 mg at 04/15/18 1203  . fluticasone (FLONASE) 50 MCG/ACT nasal spray 2 spray  2 spray Each Nare Daily Johnathan Hausen, MD   2 spray at 04/16/18 1758  . guaiFENesin (MUCINEX) 12 hr tablet 1,200 mg  1,200 mg Oral BID Johnathan Hausen, MD   1,200 mg at 04/16/18 2141  . ipratropium-albuterol (DUONEB) 0.5-2.5 (3) MG/3ML nebulizer solution 3 mL  3 mL Nebulization Q6H PRN Johnathan Hausen, MD      . lactated ringers infusion   Intravenous Continuous  Johnathan Hausen, MD 100 mL/hr at 04/17/18 [phone]    . loratadine (CLARITIN) tablet 10 mg  10 mg Oral Daily Johnathan Hausen, MD   10 mg at 04/15/18 1202  . pantoprazole (PROTONIX) injection 40 mg  40 mg Intravenous Q12H Johnathan Hausen, MD   40 mg at 04/16/18 2142  . polyethylene glycol (MIRALAX / GLYCOLAX) packet 34 g  34 g Oral BID Johnathan Hausen, MD   34 g at 04/16/18 2142  . [facility]   Oral PRN Johnathan Hausen, MD      . senna-docusate (Senokot-S) tablet 1 tablet  1  tablet Oral BID Johnathan Hausen, MD   1 tablet at 04/16/18 2141     Discharge Medications: Please see discharge summary for a list of discharge medications.  Relevant Imaging Results:  Relevant Lab Results:   Additional Information ssn: [id number]  Servando Snare, LCSW

## 2018-04-17 NOTE — Progress Notes (Signed)
Morgan Morrow   DOB:03/10/1941   XF#:[id number]   IRC#:[id number]  Subjective:  Morgan Morrow had punch biopsies of her right breast mass and scalp lesion yesterday.  She tolerated the procedures well.  She has had minimal bleeding from the scalp lesion.  She tells me she did sit in the recliner part of the day yesterday.  She walked in the hall with the help of physical therapy with a walker.  She does not feel she is anywhere near strong enough to go home and she is considering the possibility of going to a rehab facility to get stronger  She tells me she had no bowel movements yesterday and in general her diarrhea appears to have resolved.  She tells me she is eating and drinking "okay".  No family in room   Objective: Older white woman examined in bed Vitals:   04/16/18 2027 04/17/18 0600  BP: 120/68 112/63  Pulse: 83 81  Resp: 18 18  Temp: 98 F (36.7 C) 98.1 F (36.7 C)  SpO2: 98% 92%    Body mass index is 20.2 kg/m.  Intake/Output Summary (Last 24 hours) at 04/17/2018 0824 Last data filed at 04/17/2018 0411 Gross per 24 hour  Intake 2089.41 ml  Output -  Net 2089.41 ml     Sclerae unicteric  Oropharynx slightly dry  Lungs no rales or wheezes--auscultated anterolaterally  Heart regular rate and rhythm  Abdomen soft, +BS  Neuro nonfocal  Breast exam: Not repeated  Skin: Punch The site not bleeding  CBG (last 3)  No results for input(s): GLUCAP in the last 72 hours.   Labs:  Lab Results  Component Value Date   WBC 5.1 04/16/2018   HGB 8.6 (L) 04/16/2018   HCT 27.8 (L) 04/16/2018   MCV 97.2 04/16/2018   PLT 171 04/16/2018   NEUTROABS 3.6 04/16/2018    @LASTCHEMISTRY @  Urine Studies No results for input(s): UHGB, CRYS in the last 72 hours.  Invalid input(s): UACOL, UAPR, USPG, UPH, UTP, UGL, UKET, UBIL, UNIT, UROB, [place], UEPI, UWBC, Duwayne Heck [place], [place]  Basic Metabolic Panel: Recent Labs  Lab 04/13/18 1740 04/14/18 0656 04/15/18 0544 04/16/18 0617   NA 141 138 142 142  K 3.2* 3.6 3.6 3.2*  CL 101 102 110 111  CO2 28 26 23 22   GLUCOSE 132* 82 72 84  BUN 51* 40* 33* 29*  CREATININE 1.09* 0.92 0.97 0.82  CALCIUM 9.7 8.9 8.8* 8.4*  MG 2.3  --   --  1.9   GFR Estimated Creatinine Clearance: 37.9 mL/min (by C-G formula based on SCr of 0.82 mg/dL). Liver Function Tests: Recent Labs  Lab 04/13/18 1740 04/14/18 0656 04/15/18 0544  AST 103* 111* 125*  ALT 30 27 28   ALKPHOS 249* 221* 310*  BILITOT 0.7 0.5 0.6  PROT 6.6 5.3* 5.4*  ALBUMIN 3.2* 2.5* 2.4*   Recent Labs  Lab 04/13/18 1740 04/14/18 0656 04/15/18 0544  LIPASE 321* 190* 74*   No results for input(s): AMMONIA in the last 168 hours. Coagulation profile Recent Labs  Lab 04/14/18 0656  INR 0.97    CBC: Recent Labs  Lab 04/13/18 1740 04/14/18 0656 04/14/18 1701 04/15/18 0544 04/16/18 0617  WBC 5.2 4.2  --  4.4 5.1  NEUTROABS 4.2  --   --  2.8 3.6  HGB 10.7* 8.9* 9.9* 9.0* 8.6*  HCT 33.5* 28.1* 31.8* 29.1* 27.8*  MCV 95.7 96.9  --  97.0 97.2  PLT 216 186  --  182  171   Cardiac Enzymes: Recent Labs  Lab 04/13/18 1740  TROPONINI <0.03   BNP: Invalid input(s): POCBNP CBG: No results for input(s): GLUCAP in the last 168 hours. D-Dimer No results for input(s): DDIMER in the last 72 hours. Hgb A1c No results for input(s): HGBA1C in the last 72 hours. Lipid Profile No results for input(s): CHOL, HDL, LDLCALC, TRIG, CHOLHDL, LDLDIRECT in the last 72 hours. Thyroid function studies No results for input(s): TSH, T4TOTAL, T3FREE, THYROIDAB in the last 72 hours.  Invalid input(s): FREET3 Anemia work up Recent Labs    04/14/18 0955  VITAMINB12 678  FOLATE 18.4  FERRITIN 2,513*  TIBC 311  IRON 57  RETICCTPCT 1.7   Microbiology Recent Results (from the past 240 hour(s))  Culture, blood (routine x 2)     Status: None (Preliminary result)   Collection Time: 04/13/18 10:02 PM  Result Value Ref Range Status   Specimen Description BLOOD RIGHT  WRIST  Final   Special Requests   Final    BOTTLES DRAWN AEROBIC AND ANAEROBIC Blood Culture adequate volume Performed at [facility], [place] [address] [place], [place] [address]    Culture   Final    NO GROWTH 2 DAYS Performed at [place] [address] [place], [place] [address]    Report Status PENDING  Incomplete  Culture, blood (routine x 2)     Status: None (Preliminary result)   Collection Time: 04/13/18 10:02 PM  Result Value Ref Range Status   Specimen Description   Final    BLOOD RIGHT HAND Performed at [facility], [place] [address], [place], [place] [address]    Special Requests   Final    BOTTLES DRAWN AEROBIC ONLY Blood Culture adequate volume Performed at [place] [address], [place], [place] [address]    Culture   Final    NO GROWTH 2 DAYS Performed at [place] [address] [place], [place] [address]    Report Status PENDING  Incomplete  MRSA PCR Screening     Status: None   Collection Time: 04/15/18 11:04 PM  Result Value Ref Range Status   MRSA by PCR NEGATIVE NEGATIVE Final    Comment:        The GeneXpert MRSA Assay (FDA approved for NASAL specimens only), is one component of a comprehensive MRSA colonization surveillance program. It is not intended to diagnose MRSA infection nor to guide or monitor treatment for MRSA infections. Performed at [facility], [place] [address] [place], [place] [address]       Studies:  Dg Swallowing Func-speech Pathology  Result Date: 04/15/2018 Objective Swallowing Evaluation: Type of Study: MBS-Modified Barium Swallow Study  Patient Details Name: Morgan Morrow MRN: [id number] Date of Birth: February 12, 1941 Today's Date: 04/15/2018 Time: SLP Start Time (ACUTE ONLY): 2376 -SLP Stop Time (ACUTE ONLY): 1350 SLP Time Calculation (min) (ACUTE ONLY): 15 min Past Medical History: Past Medical History: Diagnosis Date . Allergy  .  Hypertension  Past Surgical History: No past surgical history on file. HPI: 78 yo patient referred for MBS due to pt having dysphagia.  Pt admitted with cachexia, stool burden, breast and scalp mass - likely metastatic breast cancer.  She underwent clinical evaluation of swallow yesterday and was placed on honey thick liquids and regular foods.  MBS indicated.  Reports voice became hoarse last April - denies significant issues with reflux.   Subjective: The patient was  seen sitting upright in chair Assessment / Plan / Recommendation CHL IP CLINICAL IMPRESSIONS 04/15/2018 Clinical Impression Patient presents with mild pharyngeal dysphagia with decreased timing and adequacy of laryngeal elevation/closure causing minimal aspiration of thin with head neutral.  Aspiration was silent when trace but audible with cough response when mild. Chin tuck posture with pt fully upright and small boluses, airway protection intact.   Pharyngeal swallow is strong without significant residuals with thin, nectar, cracker - Limited amount of barium pt was provided due to pt's bowel issue.  Recommend advance diet to regular/thin with chin tuck posture.  Educated pt to aspiration precautions and mitigation strategies using teach back.   SLP Visit Diagnosis Dysphagia, pharyngeal phase (R13.13) Attention and concentration deficit following -- Frontal lobe and executive function deficit following -- Impact on safety and function Mild aspiration risk   CHL IP TREATMENT RECOMMENDATION 04/15/2018 Treatment Recommendations Therapy as outlined in treatment plan below   Prognosis 04/15/2018 Prognosis for Safe Diet Advancement Fair Barriers to Reach Goals Other (Comment) Barriers/Prognosis Comment -- CHL IP DIET RECOMMENDATION 04/15/2018 SLP Diet Recommendations Regular solids;Thin liquid Liquid Administration via Straw Medication Administration Whole meds with puree Compensations Slow rate;Small sips/bites;Chin tuck Postural Changes Remain semi-upright  after after feeds/meals (Comment);Seated upright at 90 degrees   CHL IP OTHER RECOMMENDATIONS 04/15/2018 Recommended Consults Other (Comment) Oral Care Recommendations Oral care QID Other Recommendations --   CHL IP FOLLOW UP RECOMMENDATIONS 04/14/2018 Follow up Recommendations Other (comment)   CHL IP FREQUENCY AND DURATION 04/15/2018 Speech Therapy Frequency (ACUTE ONLY) min 2x/week Treatment Duration 2 weeks      CHL IP ORAL PHASE 04/15/2018 Oral Phase Impaired Oral - Pudding Teaspoon -- Oral - Pudding Cup -- Oral - Honey Teaspoon -- Oral - Honey Cup -- Oral - Nectar Teaspoon -- Oral - Nectar Cup WFL Oral - Nectar Straw -- Oral - Thin Teaspoon WFL Oral - Thin Cup WFL Oral - Thin Straw WFL Oral - Puree NT Oral - Mech Soft -- Oral - Regular WFL Oral - Multi-Consistency -- Oral - Pill -- Oral Phase - Comment --  CHL IP PHARYNGEAL PHASE 04/15/2018 Pharyngeal Phase Impaired Pharyngeal- Pudding Teaspoon -- Pharyngeal -- Pharyngeal- Pudding Cup -- Pharyngeal -- Pharyngeal- Honey Teaspoon -- Pharyngeal -- Pharyngeal- Honey Cup -- Pharyngeal -- Pharyngeal- Nectar Teaspoon -- Pharyngeal -- Pharyngeal- Nectar Cup Penetration/Aspiration during swallow;WFL Pharyngeal -- Pharyngeal- Nectar Straw -- Pharyngeal -- Pharyngeal- Thin Teaspoon WFL Pharyngeal -- Pharyngeal- Thin Cup Reduced airway/laryngeal closure;Penetration/Aspiration during swallow Pharyngeal Material enters airway, passes BELOW cords without attempt by patient to eject out (silent aspiration) Pharyngeal- Thin Straw Penetration/Apiration after swallow Pharyngeal Material enters airway, passes BELOW cords without attempt by patient to eject out (silent aspiration);Material enters airway, passes BELOW cords and not ejected out despite cough attempt by patient Pharyngeal- Puree NT Pharyngeal -- Pharyngeal- Mechanical Soft -- Pharyngeal -- Pharyngeal- Regular WFL Pharyngeal -- Pharyngeal- Multi-consistency -- Pharyngeal -- Pharyngeal- Pill -- Pharyngeal -- Pharyngeal  Comment chin tuck helpful to prevent aspiration - trace penetration only which cleared  CHL IP CERVICAL ESOPHAGEAL PHASE 04/15/2018 Cervical Esophageal Phase WFL Pudding Teaspoon -- Pudding Cup -- Honey Teaspoon -- Honey Cup -- Nectar Teaspoon -- Nectar Cup -- Nectar Straw -- Thin Teaspoon -- Thin Cup -- Thin Straw -- Puree -- Mechanical Soft -- Regular -- Multi-consistency -- Pill -- Cervical Esophageal Comment -- Macario Golds 04/15/2018, 2:18 PM    Luanna Salk, MS [facility] SLP Acute Rehab Services Pager [phone] Office [phone]  [facility] [employer] [employer] [facility] Chest & Delayed Img Double Cm  Result Date: 04/16/2018 CLINICAL DATA:  78 year old female with dysphagia nausea and vomiting. Metastatic breast cancer. Subsequent encounter. EXAM: ESOPHOGRAM/BARIUM SWALLOW TECHNIQUE: Single contrast examination was performed using  thin barium. FLUOROSCOPY TIME:  Fluoroscopy Time:  1 minutes and 6 seconds Radiation Exposure Index: 6.5 mGy COMPARISON:  04/15/2018 speech swallow.  04/13/2018 chest CT. FINDINGS: Exam was tailored to the patient. Single contrast exam performed with thin barium. No laryngeal aspiration occurred with chin tuck position. Slightly blunted primary esophageal stripping wave. No esophageal obstructing lesion. Mild smooth narrowing of the distal esophagus just above small hiatal hernia. 2 cm proximal to this level, there is a small circumferential esophageal stricture. The esophagus just above this level has a slightly lobulated appearance left lateral aspect. Cause of strictures indeterminate. Retained secretions limited evaluation esophageal mucosa. Patient attempted but was not able to swallow barium tablet. IMPRESSION: 1. Exam was tailored to the patient. 2. No laryngeal aspiration occurred with chin tuck position. 3. Slightly blunted primary esophageal stripping wave. 4. No esophageal obstructing lesion. 5. Mild smooth narrowing of the distal esophagus just above small hiatal hernia.  2 cm proximal to this level, there is a small circumferential esophageal stricture. The esophagus just above this stricture has a slightly lobulated appearance along left lateral aspect. Cause of stricture is indeterminate. 6. Retained secretions limited evaluation of esophageal mucosa. 7. Patient attempted but was not able to swallow barium tablet (poor oropharyngeal phase). Electronically Signed   By: Genia Del M.D.   On: 04/16/2018 10:14    Assessment: 78 [place] womanpresenting with weight loss and failure to thrive as well as persistent diarrhea, CT abdomen and pelvis 04/13/2018 showing lytic bone lesions, possible liver and lung lesions, and evidence of proctocolitis, with exam showing an obvious right-sided breast cancer as imaged above  (1) metastatic breast cancer: Biopsy of right breast and scalp lesion 04/16/2018, results pending  (2) staging studies:             (a) CT scans of the C/A/P/brain 01/10-02/2019 show lung, liver and bone lesions, no brain involvement  (3) chaplain service to assist patient in completing healthcare power of attorney documents  (4) anastrozole started 04/17/2018     Plan:  I discussed the biology of breast cancer again with Morgan Morrow and she understands 80% of breast cancer paces her estrogen receptor positive.  On the presumption that hers is as well we are starting anastrozole today.  She has a good understanding of the possible toxicity side effects and complications of this agent.  She is agreeable to starting.  Once she sees me on an outpatient basis, in the clinic, we will add palbociclib.  I do not see a note from the chaplain yet.  We are waiting on healthcare power of attorney determination on this patient  Morgan Morrow does not feel she can live by herself in her current status.  This is probably correct.  I wonder if SNF placement should be initiated at this point  All the treatments that I have for her can be done outpatient.  I plan  to see her again on 05/04/2018 and we will call her with that appointment.     Chauncey Cruel, MD 04/17/2018  8:24 AM Medical Oncology and Hematology [facility] [address] [place], [place] [address] Tel. [phone]    Fax. [phone]

## 2018-04-17 NOTE — Progress Notes (Signed)
PROGRESS NOTE    Morgan Morrow  IRC:[id number] DOB: 1940-08-05 DOA: 04/13/2018 PCP: Leighton Ruff, MD   Brief Narrative:  Per Dr Peyton Najjar Morgan Morrow is a 78 y.o. female with medical history significant for htn, who presented with weight loss and diarrhea.    History obtained from patient and her daughter.  Daughter reported decline in health over the past year, getting more rapid. Significant weight loss, difficulty swallowing, hoarseness of voice, difficulty with mobility, and increased confusion. Has a PCP, was last seen in approximately November, patient and family unaware of results of that work-up.  Presented to ED mainly because daughter says patient finally relented, which she explains to mean she has been concerned about her mother's health for some time. The only acute change is about 2 weeks of diarrhea, no blood or mucous. No fevers, no recent travel or antibiotics. No abdominal pain. Denies cough or shortness of breath. Denies fevers or recent falls.   ED Course: fecal disempaction, labs, antibiotics, fluids, ct, CXR    Assessment & Plan:   Principal Problem:   Metastatic breast cancer ([place]) Active Problems:   Cachexia ([place])   Hematochezia   Community acquired pneumonia   Constipation, chronic   Osteolytic lesion   FTT (failure to thrive) in adult   Anemia   Murmur, cardiac   Diarrhea   Fecal impaction in rectum ([facility])   Hypokalemia   Breast mass, probable cancer   Scalp mass, probable breast cancer metastasis   Stercoral proctocolitis of rectum  1 probable metastatic breast cancer/FTT Patient notes to have right breast cancer, weight loss, increasing disability.  CT abdomen and pelvis with pulmonary opacities, extensive osteolytic lesions, hypodense lesions in the liver as well as mural thickening in the rectal mucosal and esophageal thickening.  Concerned that primary may be breast cancer.  Patient states last mammogram was approximately 10 years ago.   Oncology has been consulted and patient seen in consultation by Dr. Jana Hakim.  CT head was negative for any metastatic disease.  CT chest showing liver, lung and bone lesions.  CA 27-29 pending.  Oncology has consulted with general surgery for core needle biopsy.  Patient s/p core needle breast biopsy and scalp biopsy 04/16/2018.  Patient started on Arimidex per oncology.  Outpatient follow-up with oncology.  2.  Dysphagia Patient with dysphagia and difficulty swallowing however unable to fully describe whether just to solids or liquids.  CT abdomen and pelvis with esophageal thickening.  Patient with significant weight loss, debility, hoarseness of voice.  Patient noted to be FOBT positive and anemic.  Hemoglobin currently at 9.0 from 8.9 from 10.7 on admission.  Likely partly dilutional.  Anemia panel consistent with anemia of chronic disease.  Change IV PPI to oral Protonix.  Patient has been seen in consultation by GI and GI recommended barium swallow which was done 04/16/2018 which did show no obstructive lesion but smooth narrowing of distal esophagus and slight lobulated appearance just above the stricture.  GI discussed findings with family who at this time have declined the EGD and awaiting breast biopsy results.  If breast biopsy results are inconclusive may need to reconsider EGD per GI.  Appreciate GIs input and recommendations.  Speech therapy also following.  3.  Fecal impaction/constipation/stercoral ulcer of the anus Fecal disimpaction was attempted in the ED.  Residual stool ball noted on CT.  No signs of perforation.  FOBT positive.  Patient given a fleets enema on day of admission, however no significant  results.  Continue daily Dulcolax suppositories.  Soapsuds enema x1 given.  Patient started on MiraLAX twice daily per general surgery.  Follow.   4.  Anemia Anemia panel consistent with a anemia of chronic disease.  Ferritin elevated at 2513.  B12 levels are 678.  Hemoglobin currently  stable at 8.7 from 8.6 from 9.0 from 8.9 from 10.7 on admission.  Partly likely dilutional.  FOBT was positive.  Patient has been seen by GI.  Continue PPI.  Transfusion threshold hemoglobin less than 7. Patient with barium swallow done with no obstructive lesion with smooth narrowing of distal esophagus and slightly lobulated appearance just above the stricture.  Patient seen by gastroenterology who discussed barium findings swallow with the family and have declined EGD at this time.  See GI notes.  Follow.   5.  Murmur Noted on examination.  Patient states she has had a murmur for a long time.  2D echo with EF 55-60%, mild AV stenosis.  No further work-up needed at this time.  Outpatient follow-up.  6.  Diarrhea Likely secondary to stool incontinence secondary to fecal impaction/constipation.  Discontinued C. difficile PCR and enteric precautions.  Continue Dulcolax suppository daily.  7.  Hypokalemia Magnesium level at 2.3.  Replete.  8.  Elevated lipase/ Patient denies any epigastric abdominal pain.  Tolerating diet.  Follow.  9.  ??? Community-acquired pneumonia Chest x-ray was suggestive of a pneumonia.  CT findings of abdomen and pelvis most suggestive of metastatic process ongoing.  Patient noted to be hypothermic and hypotensive on admission.  Patient pancultured.  CT chest done was negative for middle lobe pneumonia and findings noted on chest x-ray was felt to be secondary to breast cancer.  IV antibiotics have been discontinued. Follow.   10.  Hypothermia Likely secondary to metastatic cancer malnutrition.  Patient also noted to have concerns for community-acquired pneumonia on admission.  Off [employer].  Hypothermia seems to have resolved.  CT chest negative for pneumonia.  IV antibiotics have been discontinued.  Follow.   DVT prophylaxis: SCDs Code Status: Full Family Communication: Updated patient and family at bedside. Disposition Plan: Skilled nursing facility hopefully in  the next 24 hours.    Consultants:   Oncology: Dr. Jana Hakim 04/14/2018  Gastroenterology: Dr. Michail Sermon 04/14/2018  General surgery: Dr. Johney Maine 04/15/2018  Procedures:  .  CT abdomen and pelvis 04/13/2018 Chest x-ray 04/13/2018 CT head 04/14/2018 CT chest 04/14/2018 2D echo 04/15/2018 Barium swallow 04/16/2018 Core biopsies of breast and 2 scalp lesions per Dr. Hassell Done 04/16/2018    Antimicrobials:   IV Rocephin 04/13/2018>>>>>>> 04/15/2018  IV azithromycin 04/13/2018>>>>> 04/15/2018   Subjective: Patient sitting up in chair.  Patient complaining of feeling bloated and needing to burp.  No chest pain.  No shortness of breath.  Tolerating current diet.  Patient having some small bowel movements per nurse tech.  Objective: Vitals:   04/16/18 1636 04/16/18 1745 04/16/18 2027 04/17/18 0600  BP: (!) 109/59 (!) 112/58 120/68 112/63  Pulse: 80 83 83 81  Resp: 16 16 18 18   Temp: 98.2 F (36.8 C) 98.3 F (36.8 C) 98 F (36.7 C) 98.1 F (36.7 C)  TempSrc:  Oral Oral Oral  SpO2: 98% 98% 98% 92%  Weight:      Height:        Intake/Output Summary (Last 24 hours) at 04/17/2018 1040 Last data filed at 04/17/2018 0411 Gross per 24 hour  Intake 2089.41 ml  Output -  Net 2089.41 ml   [employer]  04/13/18 1638  Weight: 45.4 kg    Examination:  General exam: Pallor Respiratory system: Lungs clear to auscultation bilaterally.  No wheezes, no crackles, no rhonchi.   Cardiovascular system: RRR with 3/6 SEM. No JVD, murmurs, rubs, gallops or clicks. No pedal edema. Gastrointestinal system: Abdomen is soft, mildly distended, positive bowel sounds, nontender to palpation.  No rebound.  No guarding. Central nervous system: Alert and oriented. No focal neurological deficits. Extremities: Symmetric 5 x 5 power. Skin: No rashes, lesions or ulcers Psychiatry: Judgement and insight appear normal. Mood & affect appropriate.  Breast; left breast normal.  Right breast with a large cancerous  tissue/large erythematous hard lesion around nipple.    Data Reviewed: I have personally reviewed following labs and imaging studies  CBC: Recent Labs  Lab 04/13/18 1740 04/14/18 0656 04/14/18 1701 04/15/18 0544 04/16/18 0617 04/17/18 0825  WBC 5.2 4.2  --  4.4 5.1 5.6  NEUTROABS 4.2  --   --  2.8 3.6 4.1  HGB 10.7* 8.9* 9.9* 9.0* 8.6* 8.7*  HCT 33.5* 28.1* 31.8* 29.1* 27.8* 28.4*  MCV 95.7 96.9  --  97.0 97.2 98.3  PLT 216 186  --  182 171 376   Basic Metabolic Panel: Recent Labs  Lab 04/13/18 1740 04/14/18 0656 04/15/18 0544 04/16/18 0617 04/17/18 0825  NA 141 138 142 142 141  K 3.2* 3.6 3.6 3.2* 3.5  CL 101 102 110 111 108  CO2 28 26 23 22 23   GLUCOSE 132* 82 72 84 54*  BUN 51* 40* 33* 29* 33*  CREATININE 1.09* 0.92 0.97 0.82 0.99  CALCIUM 9.7 8.9 8.8* 8.4* 8.5*  MG 2.3  --   --  1.9  --    GFR: Estimated Creatinine Clearance: 31.4 mL/min (by C-G formula based on SCr of 0.99 mg/dL). Liver Function Tests: Recent Labs  Lab 04/13/18 1740 04/14/18 0656 04/15/18 0544  AST 103* 111* 125*  ALT 30 27 28   ALKPHOS 249* 221* 310*  BILITOT 0.7 0.5 0.6  PROT 6.6 5.3* 5.4*  ALBUMIN 3.2* 2.5* 2.4*   Recent Labs  Lab 04/13/18 1740 04/14/18 0656 04/15/18 0544  LIPASE 321* 190* 74*   No results for input(s): AMMONIA in the last 168 hours. Coagulation Profile: Recent Labs  Lab 04/14/18 0656  INR 0.97   Cardiac Enzymes: Recent Labs  Lab 04/13/18 1740  TROPONINI <0.03   BNP (last 3 results) No results for input(s): PROBNP in the last 8760 hours. HbA1C: No results for input(s): HGBA1C in the last 72 hours. CBG: No results for input(s): GLUCAP in the last 168 hours. Lipid Profile: No results for input(s): CHOL, HDL, LDLCALC, TRIG, CHOLHDL, LDLDIRECT in the last 72 hours. Thyroid Function Tests: No results for input(s): TSH, T4TOTAL, FREET4, T3FREE, THYROIDAB in the last 72 hours. Anemia Panel: No results for input(s): VITAMINB12, FOLATE, FERRITIN,  TIBC, IRON, RETICCTPCT in the last 72 hours. Sepsis Labs: Recent Labs  Lab 04/13/18 1818 04/13/18 2316  LATICACIDVEN 1.6 1.5    Recent Results (from the past 240 hour(s))  Culture, blood (routine x 2)     Status: None (Preliminary result)   Collection Time: 04/13/18 10:02 PM  Result Value Ref Range Status   Specimen Description BLOOD RIGHT WRIST  Final   Special Requests   Final    BOTTLES DRAWN AEROBIC AND ANAEROBIC Blood Culture adequate volume Performed at [facility] [address], [place], [place] [address]    Culture   Final    NO  GROWTH 2 DAYS Performed at [facility], [place] [address], [place], [place] [address]    Report Status PENDING  Incomplete  Culture, blood (routine x 2)     Status: None (Preliminary result)   Collection Time: 04/13/18 10:02 PM  Result Value Ref Range Status   Specimen Description   Final    BLOOD RIGHT HAND Performed at [facility], [place] [address], [place], [place] [address]    Special Requests   Final    BOTTLES DRAWN AEROBIC ONLY Blood Culture adequate volume Performed at [facility] [address], [place], [place] [address]    Culture   Final    NO GROWTH 2 DAYS Performed at [facility] [address] [place], [place] [address]    Report Status PENDING  Incomplete  MRSA PCR Screening     Status: None   Collection Time: 04/15/18 11:04 PM  Result Value Ref Range Status   MRSA by PCR NEGATIVE NEGATIVE Final    Comment:        The GeneXpert MRSA Assay (FDA approved for NASAL specimens only), is one component of a comprehensive MRSA colonization surveillance program. It is not intended to diagnose MRSA infection nor to guide or monitor treatment for MRSA infections. Performed at [facility], [place] [address] [place], [place] [address]          Radiology Studies: Dg Swallowing Func-speech Pathology  Result Date: 04/15/2018 Objective  Swallowing Evaluation: Type of Study: MBS-Modified Barium Swallow Study  Patient Details Name: Morgan Morrow MRN: [id number] Date of Birth: [birth year] Today's Date: 04/15/2018 Time: SLP Start Time (ACUTE ONLY): 1093 -SLP Stop Time (ACUTE ONLY): 1350 SLP Time Calculation (min) (ACUTE ONLY): 15 min Past Medical History: Past Medical History: Diagnosis Date . Allergy  . Hypertension  Past Surgical History: No past surgical history on file. HPI: 78 yo patient referred for MBS due to pt having dysphagia.  Pt admitted with cachexia, stool burden, breast and scalp mass - likely metastatic breast cancer.  She underwent clinical evaluation of swallow yesterday and was placed on honey thick liquids and regular foods.  MBS indicated.  Reports voice became hoarse last April - denies significant issues with reflux.   Subjective: The patient was seen sitting upright in chair Assessment / Plan / Recommendation CHL IP CLINICAL IMPRESSIONS 04/15/2018 Clinical Impression Patient presents with mild pharyngeal dysphagia with decreased timing and adequacy of laryngeal elevation/closure causing minimal aspiration of thin with head neutral.  Aspiration was silent when trace but audible with cough response when mild. Chin tuck posture with pt fully upright and small boluses, airway protection intact.   Pharyngeal swallow is strong without significant residuals with thin, nectar, cracker - Limited amount of barium pt was provided due to pt's bowel issue.  Recommend advance diet to regular/thin with chin tuck posture.  Educated pt to aspiration precautions and mitigation strategies using teach back.   SLP Visit Diagnosis Dysphagia, pharyngeal phase (R13.13) Attention and concentration deficit following -- Frontal lobe and executive function deficit following -- Impact on safety and function Mild aspiration risk   CHL IP TREATMENT RECOMMENDATION 04/15/2018 Treatment Recommendations Therapy as outlined in treatment plan below   Prognosis 04/15/2018  Prognosis for Safe Diet Advancement Fair Barriers to Reach Goals Other (Comment) Barriers/Prognosis Comment -- CHL IP DIET RECOMMENDATION 04/15/2018 SLP Diet Recommendations Regular solids;Thin liquid Liquid Administration via Straw Medication Administration Whole meds with puree Compensations Slow rate;Small sips/bites;Chin tuck  Postural Changes Remain semi-upright after after feeds/meals (Comment);Seated upright at 90 degrees   CHL IP OTHER RECOMMENDATIONS 04/15/2018 Recommended Consults Other (Comment) Oral Care Recommendations Oral care QID Other Recommendations --   CHL IP FOLLOW UP RECOMMENDATIONS 04/14/2018 Follow up Recommendations Other (comment)   CHL IP FREQUENCY AND DURATION 04/15/2018 Speech Therapy Frequency (ACUTE ONLY) min 2x/week Treatment Duration 2 weeks      CHL IP ORAL PHASE 04/15/2018 Oral Phase Impaired Oral - Pudding Teaspoon -- Oral - Pudding Cup -- Oral - Honey Teaspoon -- Oral - Honey Cup -- Oral - Nectar Teaspoon -- Oral - Nectar Cup WFL Oral - Nectar Straw -- Oral - Thin Teaspoon WFL Oral - Thin Cup WFL Oral - Thin Straw WFL Oral - Puree NT Oral - Mech Soft -- Oral - Regular WFL Oral - Multi-Consistency -- Oral - Pill -- Oral Phase - Comment --  CHL IP PHARYNGEAL PHASE 04/15/2018 Pharyngeal Phase Impaired Pharyngeal- Pudding Teaspoon -- Pharyngeal -- Pharyngeal- Pudding Cup -- Pharyngeal -- Pharyngeal- Honey Teaspoon -- Pharyngeal -- Pharyngeal- Honey Cup -- Pharyngeal -- Pharyngeal- Nectar Teaspoon -- Pharyngeal -- Pharyngeal- Nectar Cup Penetration/Aspiration during swallow;WFL Pharyngeal -- Pharyngeal- Nectar Straw -- Pharyngeal -- Pharyngeal- Thin Teaspoon WFL Pharyngeal -- Pharyngeal- Thin Cup Reduced airway/laryngeal closure;Penetration/Aspiration during swallow Pharyngeal Material enters airway, passes BELOW cords without attempt by patient to eject out (silent aspiration) Pharyngeal- Thin Straw Penetration/Apiration after swallow Pharyngeal Material enters airway, passes BELOW cords  without attempt by patient to eject out (silent aspiration);Material enters airway, passes BELOW cords and not ejected out despite cough attempt by patient Pharyngeal- Puree NT Pharyngeal -- Pharyngeal- Mechanical Soft -- Pharyngeal -- Pharyngeal- Regular WFL Pharyngeal -- Pharyngeal- Multi-consistency -- Pharyngeal -- Pharyngeal- Pill -- Pharyngeal -- Pharyngeal Comment chin tuck helpful to prevent aspiration - trace penetration only which cleared  CHL IP CERVICAL ESOPHAGEAL PHASE 04/15/2018 Cervical Esophageal Phase WFL Pudding Teaspoon -- Pudding Cup -- Honey Teaspoon -- Honey Cup -- Nectar Teaspoon -- Nectar Cup -- Nectar Straw -- Thin Teaspoon -- Thin Cup -- Thin Straw -- Puree -- Mechanical Soft -- Regular -- Multi-consistency -- Pill -- Cervical Esophageal Comment -- Macario Golds 04/15/2018, 2:18 PM    Luanna Salk, MS [facility] SLP Acute Rehab Services Pager [phone] Office [phone]           Dg Esophagus Inc Scout Chest & Delayed Img Double Cm  Result Date: 04/16/2018 CLINICAL DATA:  78 year old female with dysphagia nausea and vomiting. Metastatic breast cancer. Subsequent encounter. EXAM: ESOPHOGRAM/BARIUM SWALLOW TECHNIQUE: Single contrast examination was performed using  thin barium. FLUOROSCOPY TIME:  Fluoroscopy Time:  1 minutes and 6 seconds Radiation Exposure Index: 6.5 mGy COMPARISON:  04/15/2018 speech swallow.  04/13/2018 chest CT. FINDINGS: Exam was tailored to the patient. Single contrast exam performed with thin barium. No laryngeal aspiration occurred with chin tuck position. Slightly blunted primary esophageal stripping wave. No esophageal obstructing lesion. Mild smooth narrowing of the distal esophagus just above small hiatal hernia. 2 cm proximal to this level, there is a small circumferential esophageal stricture. The esophagus just above this level has a slightly lobulated appearance left lateral aspect. Cause of strictures indeterminate. Retained secretions limited  evaluation esophageal mucosa. Patient attempted but was not able to swallow barium tablet. IMPRESSION: 1. Exam was tailored to the patient. 2. No laryngeal aspiration occurred with chin tuck position. 3. Slightly blunted primary esophageal stripping wave. 4. No esophageal obstructing lesion. 5. Mild smooth narrowing of the distal esophagus just above small hiatal  hernia. 2 cm proximal to this level, there is a small circumferential esophageal stricture. The esophagus just above this stricture has a slightly lobulated appearance along left lateral aspect. Cause of stricture is indeterminate. 6. Retained secretions limited evaluation of esophageal mucosa. 7. Patient attempted but was not able to swallow barium tablet (poor oropharyngeal phase). Electronically Signed   By: Genia Del M.D.   On: 04/16/2018 10:14        Scheduled Meds: . anastrozole  1 mg Oral Daily  . bisacodyl  10 mg Rectal Daily  . fluticasone  2 spray Each Nare Daily  . guaiFENesin  1,200 mg Oral BID  . loratadine  10 mg Oral Daily  . pantoprazole (PROTONIX) IV  40 mg Intravenous Q12H  . polyethylene glycol  34 g Oral BID  . senna-docusate  1 tablet Oral BID   Continuous Infusions: . lactated ringers 100 mL/hr at 04/17/18 0352     LOS: 4 days    Time spent: 40 minutes    Irine Seal, MD [facility]  If 7PM-7AM, please contact night-coverage [email or web address] Password [email or web address] 04/17/2018, 10:40 AM

## 2018-04-17 NOTE — Anesthesia Postprocedure Evaluation (Signed)
Anesthesia Post Note  Patient: Morgan Morrow  Procedure(s) Performed: RIGHT BREAST BIOPSY AND RIGHT POSTERIOR  SCALP BIOPSY AND TOP SCALP BIOPSY (Right Breast)     Patient location during evaluation: PACU Anesthesia Type: MAC Level of consciousness: awake and alert Pain management: pain level controlled Vital Signs Assessment: post-procedure vital signs reviewed and stable Respiratory status: spontaneous breathing, nonlabored ventilation, respiratory function stable and patient connected to nasal cannula oxygen Cardiovascular status: stable and blood pressure returned to baseline Postop Assessment: no apparent nausea or vomiting Anesthetic complications: no    Last Vitals:  Vitals:   04/16/18 2027 04/17/18 0600  BP: 120/68 112/63  Pulse: 83 81  Resp: 18 18  Temp: 36.7 C 36.7 C  SpO2: 98% 92%    Last Pain:  Vitals:   04/17/18 0600  TempSrc: Oral  PainSc:                  Barnet Glasgow

## 2018-04-17 NOTE — Progress Notes (Signed)
Chaplain following due to spiritual care consult for Advance Directive, RN referral.    Provided education around advance directive.  Pt has spoken with [facility] physician about Adv. Dir. As well.  States she wishes to look over the packet and make decisions with family. Patient will reach out to RN to page chaplain services for notary or if she has further questions.     Jerene Pitch, MDiv, [facility]

## 2018-04-17 NOTE — Progress Notes (Signed)
Initial Nutrition Assessment  DOCUMENTATION CODES:   Non-severe (moderate) malnutrition in context of chronic illness  INTERVENTION:   Provide Ensure Enlive po BID, each supplement provides 350 kcal and 20 grams of protein  NUTRITION DIAGNOSIS:   Moderate Malnutrition related to chronic illness, cancer and cancer related treatments as evidenced by percent weight loss, mild fat depletion, mild muscle depletion.  GOAL:   Patient will meet greater than or equal to 90% of their needs  MONITOR:   PO intake, Supplement acceptance, Labs, Weight trends, I & O's, Skin  REASON FOR ASSESSMENT:   Malnutrition Screening Tool   ASSESSMENT:    78 y.o. female with medical history significant for htn, who presented with weight loss and diarrhea.    Patient in room with no family at bedside. Pt seemed a bit drowsy during visit. Pt reports eating better this morning, stating she "ate quite a bit of breakfast". States she had chicken, eggs, blueberry muffin, oranges and OJ. Pt reports her swallowing function is still a little difficult. She has been evaluated by SLP who states pt is at mild aspiration risk and recommends regular diet. On 1/12, pt consumed 35-50% of meals.  Pt is agreeable to receiving Ensure supplements as she has drank them at home before.  Per patient, UBW is 121 lb. States she has lost ~10 lb over the past few weeks with increased diarrhea. This would be a 9% wt loss x 1 month, significant for time frame).   Labs reviewed. Medications: Lactated Ringers infusion at 100 ml/hr  NUTRITION - FOCUSED PHYSICAL EXAM:    Most Recent Value  Orbital Region  No depletion  Upper Arm Region  Moderate depletion  Thoracic and Lumbar Region  Unable to assess  Buccal Region  Mild depletion  Temple Region  Mild depletion  Clavicle Bone Region  Moderate depletion  Clavicle and Acromion Bone Region  Mild depletion  Scapular Bone Region  Unable to assess  Dorsal Hand  Mild depletion   Patellar Region  Unable to assess  Anterior Thigh Region  Unable to assess  Posterior Calf Region  Unable to assess  Edema (RD Assessment)  None       Diet Order:   Diet Order            Diet regular Room service appropriate? Yes; Fluid consistency: Thin  Diet effective now              EDUCATION NEEDS:   Education needs have been addressed  Skin:  Skin Assessment: Skin Integrity Issues: Skin Integrity Issues:: Stage II Stage II: sacrum  Last BM:  1/14  Height:   Ht Readings from Last 1 Encounters:  04/13/18 4' 11"  (1.499 m)    Weight:   Wt Readings from Last 1 Encounters:  04/13/18 45.4 kg    Ideal Body Weight:  44.7 kg  BMI:  Body mass index is 20.2 kg/m.  Estimated Nutritional Needs:   Kcal:  8185-6314  Protein:  65-75g  Fluid:  1.6L/day  Clayton Bibles, MS, RD, LDN [place] Dietitian Pager: [phone] After Hours Pager: [phone]

## 2018-04-17 NOTE — Evaluation (Signed)
Occupational Therapy Evaluation Patient Details Name: Morgan Morrow MRN: [id number] DOB: 1941-03-12 Today's Date: 04/17/2018    History of Present Illness 78 yo female adm to [facility] on 04/14/11 with wt loss, diarrhea, health declining over last few years per family; breast mass with numerous subcutaneous and other lesions, probably metastatic breast cancer, to go for bx today;   Clinical Impression   Pt admitted with the above . Pt currently with functional limitations due to the deficits listed below (see OT Problem List).  Pt will benefit from skilled OT to increase their safety and independence with ADL and functional mobility for ADL to facilitate discharge to venue listed below.      Follow Up Recommendations  SNF    Equipment Recommendations  None recommended by OT    Recommendations for Other Services       Precautions / Restrictions        Mobility Bed Mobility               General bed mobility comments: Pt OOB  Transfers Overall transfer level: Needs assistance Equipment used: Rolling walker (2 wheeled) Transfers: Sit to/from Stand Sit to Stand: Mod assist         General transfer comment: unsteady on initial stand, needing mod assist for balance    Balance Overall balance assessment: Needs assistance Sitting-balance support: No upper extremity supported;Feet supported Sitting balance-Leahy Scale: Fair       Standing balance-Leahy Scale: Poor                             ADL either performed or assessed with clinical judgement   ADL Overall ADL's : Needs assistance/impaired Eating/Feeding: Set up;Sitting   Grooming: Minimal assistance;Sitting   Upper Body Bathing: Minimal assistance;Sitting   Lower Body Bathing: Maximal assistance;Sit to/from stand;Cueing for sequencing;Cueing for safety   Upper Body Dressing : Minimal assistance;Sitting   Lower Body Dressing: Maximal assistance;Sit to/from stand   Toilet Transfer:  RW;BSC;Stand-pivot;Minimal assistance   Toileting- Water quality scientist and Hygiene: Moderate assistance;Sit to/from stand;Cueing for sequencing;Cueing for safety         General ADL Comments: pt lives alone and will need ST SNF for rehab to regain I with ADL activity      Vision Patient Visual Report: No change from baseline       Perception     Praxis      Pertinent Vitals/Pain Pain Assessment: No/denies pain     Hand Dominance     Extremity/Trunk Assessment Upper Extremity Assessment Upper Extremity Assessment: Generalized weakness           Communication Communication Communication: No difficulties   Cognition Arousal/Alertness: Awake/alert Behavior During Therapy: WFL for tasks assessed/performed Overall Cognitive Status: Within Functional Limits for tasks assessed                                 General Comments: pt spoke [place] expects to be discharged to:: Skilled nursing facility Living Arrangements: Alone Available Help at Discharge: Family Type of Home: Apartment Home Access: Stairs to enter Technical brewer of Steps: 5   Home Layout: One level     Bathroom Shower/Tub: Chief Strategy Officer: None  Prior Functioning/Environment Level of Independence: Independent        Comments: sponge bathes        OT Problem List: Decreased strength;Decreased safety awareness;Impaired balance (sitting and/or standing);Decreased activity tolerance      OT Treatment/Interventions: Self-care/ADL training;Patient/family education;DME and/or AE instruction    OT Goals(Current goals can be found in the care plan section) Acute Rehab OT Goals Patient Stated Goal: none stated per pt; per family, for pt to get care she needs, go to rehab OT Goal Formulation: With patient Time For Goal Achievement: 04/24/18 Potential to Achieve Goals:  Good  OT Frequency: Min 2X/week   Barriers to D/C: Decreased caregiver support             AM-PAC OT "6 Clicks" Daily Activity     Outcome Measure Help from another person eating meals?: A Little Help from another person taking care of personal grooming?: A Little Help from another person toileting, which includes using toliet, bedpan, or urinal?: A Lot Help from another person bathing (including washing, rinsing, drying)?: A Lot Help from another person to put on and taking off regular upper body clothing?: A Little Help from another person to put on and taking off regular lower body clothing?: A Lot 6 Click Score: 15   End of Session Equipment Utilized During Treatment: Gait belt;Rolling walker Nurse Communication: Mobility status  Activity Tolerance: Patient tolerated treatment well Patient left: in chair;with call bell/phone within reach;with chair alarm set  OT Visit Diagnosis: Unsteadiness on feet (R26.81);Muscle weakness (generalized) (M62.81)                Time: 9136-8599 OT Time Calculation (min): 13 min Charges:  OT General Charges $OT Visit: 1 Visit OT Evaluation $OT Eval Moderate Complexity: 1 Mod  Kari Baars, [place] [phone] Office- [phone], Edwena Felty D 04/17/2018, 3:03 PM

## 2018-04-18 LAB — CBC
HCT: 31.4 % — ABNORMAL LOW (ref 36.0–46.0)
HEMOGLOBIN: 9.6 g/dL — AB (ref 12.0–15.0)
MCH: 29.5 pg (ref 26.0–34.0)
MCHC: 30.6 g/dL (ref 30.0–36.0)
MCV: 96.6 fL (ref 80.0–100.0)
Platelets: 218 10*3/uL (ref 150–400)
RBC: 3.25 MIL/uL — ABNORMAL LOW (ref 3.87–5.11)
RDW: 13.8 % (ref 11.5–15.5)
WBC: 6.9 10*3/uL (ref 4.0–10.5)
nRBC: 0 % (ref 0.0–0.2)

## 2018-04-18 LAB — BASIC METABOLIC PANEL
Anion gap: 11 (ref 5–15)
BUN: 38 mg/dL — ABNORMAL HIGH (ref 8–23)
CHLORIDE: 107 mmol/L (ref 98–111)
CO2: 22 mmol/L (ref 22–32)
Calcium: 8.8 mg/dL — ABNORMAL LOW (ref 8.9–10.3)
Creatinine, Ser: 0.95 mg/dL (ref 0.44–1.00)
GFR calc Af Amer: >60 mL/min (ref >60)
GFR calc non Af Amer: 57 mL/min — ABNORMAL LOW (ref >60)
Glucose, Bld: 88 mg/dL (ref 70–99)
Potassium: 3.7 mmol/L (ref 3.5–5.1)
Sodium: 140 mmol/L (ref 135–145)

## 2018-04-18 MED ORDER — POLYETHYLENE GLYCOL 3350 17 G PO PACK: 17.0000 g | PACK | Freq: Every day | ORAL | Status: DC | PRN

## 2018-04-18 MED ORDER — BISACODYL 10 MG RE SUPP: 10.0000 mg | Freq: Every day | RECTAL | Status: DC | PRN

## 2018-04-18 NOTE — Progress Notes (Signed)
PROGRESS NOTE    Morgan Morrow  VQQ:[id number] DOB: Mar 26, 1941 DOA: 04/13/2018 PCP: Leighton Ruff, MD   Brief Narrative:  Per Dr Morgan Morrow 78 y.o.femalewith medical history significant forhtn, who presented with weight loss and diarrhea.    History obtained from patient and her daughter.  Daughter reported decline in health over the past year, getting more rapid. Significant weight loss, difficulty swallowing, hoarseness of voice, difficulty with mobility, and increased confusion. Has Morgan Morrow PCP, was last seen in approximately November, patient and family unaware of results of that work-up.  Presented to ED mainly because daughter says patient finally relented, which she explains to mean she has been concerned about her mother's health for some time. The only acute change is about 2 weeks of diarrhea, no blood or mucous. No fevers, no recent travel or antibiotics. No abdominal pain. Denies cough or shortness of breath. Denies fevers or recent falls.  ED Course:fecal disempaction, labs, antibiotics, fluids, ct, CXR   Assessment & Plan:   Principal Problem:   Metastatic breast cancer ([place]) Active Problems:   Cachexia ([place])   Hematochezia   Community acquired pneumonia   Constipation, chronic   Osteolytic lesion   FTT (failure to thrive) in adult   Anemia   Murmur, cardiac   Diarrhea   Fecal impaction in rectum ([facility])   Hypokalemia   Breast mass, probable cancer   Scalp mass, probable breast cancer metastasis   Stercoral proctocolitis of rectum   Malnutrition of moderate degree   1 Suspected metastatic breast cancer  FTT Patient notes to have right breast cancer, weight loss, increasing disability.  CT chest with concern for ill defined pulm nodules concerning for metastatic disease, bony metastatic involvement, hypodense liver lesions concerning for metastatic involvement  CT abdomen pelvis with ill define pulm opacities at the lung base concerning  for possible metastatic disease, extensive osteolytic abnormality of axial and appendicular skeleton (concerning for metastassi of unknown primary or myeloma), 3 hypodense lesions of liver, and concern for stercoral proctocolitis Oncology following, appreciate recs  Elevated CA 27.29 Bx from 1/13 R breast and top of scalp c/w carcinoma (core bx of breast and 2 scalp lesions by surgery on 1/13) Started anastrozole per oncology, planning to add palbociclib at follow up visit   2.  Dysphagia Concern for dysphagia.  Not clear whether liquids/solids CT abdomen and pelvis with esophageal thickening.  Patient with significant weight loss, debility, hoarseness of voice.   GI c/s, appreciate recs Barium swallow notable for mild smooth narrowing of distal esophagus above small hiatal hernia.  Small circumferential esophageal stricture.  Slightly lobultated appearance along L lateral aspect above stricture.  Cause of stricture indeterminate. Per GI note on 1/13, pt/family declined EGD (if bx not conclusive, planning to reconsider EGD) Continue PPI Appreciate speech recs  Anemia: labs suggestive of AOCD.  Normal B12 and folate.  Positive FOBT.  Seen by GI.  Thought heme positive stool related to fecal impaction.  Recommended aggressive laxatives.  See prior notes.  CTM, stable.  3.  Fecal impaction/constipation/stercoral ulcer of the anus Holding laxatives at this point in time with diarrhea this AM Continue to monitor Miralax currently ordered prn.   5.  Murmur Noted on examination.  Patient states she has had Mariangel Ringley murmur for Morgan Morrow long time.  2D echo with EF 55-60%, mild AV stenosis.  No further work-up needed at this time.  Outpatient follow-up.  6.  Diarrhea Holding laxatives for now, miralax prn, follow  7.  Hypokalemia Magnesium level at 2.3.  Replete.  8.  Elevated lipase/ Patient denies any epigastric abdominal pain.  Tolerating diet.  Follow.  9.  ??? Community-acquired  pneumonia Chest x-ray was suggestive of Morgan Morrow pneumonia.  CT findings of abdomen and pelvis most suggestive of metastatic process ongoing.  Patient noted to be hypothermic and hypotensive on admission.  Patient pancultured.  CT chest done was negative for middle lobe pneumonia and findings noted on chest x-ray was felt to be secondary to breast cancer.  IV antibiotics have been discontinued. Follow.   10.  Hypothermia Likely secondary to metastatic cancer malnutrition.  Patient also noted to have concerns for community-acquired pneumonia on admission.  Off Retail banker.  Hypothermia seems to have resolved.  CT chest negative for pneumonia.  IV antibiotics have been discontinued.  Follow.  DVT prophylaxis: SCD Code Status: full  Family Communication: son daughter at bedside Disposition Plan: pending SNF placement   Consultants:   Oncology  Gastroenterology  surgery  Procedures:   Core biopsies of breast and 2 scalp lesions on 1/13 Echo Study Conclusions  - Left ventricle: The cavity size was normal. Wall thickness was   normal. Systolic function was normal. The estimated ejection   fraction was in the range of 55% to 60%. Wall motion was normal;   there were no regional wall motion abnormalities. Doppler   parameters are consistent with abnormal left ventricular   relaxation (grade 1 diastolic dysfunction). - Aortic valve: There was mild stenosis. There was trivial   regurgitation. Mean gradient (S): 15 mm Hg. - Mitral valve: Moderately calcified annulus. There was mild to   moderate regurgitation. - Left atrium: The atrium was normal in size. - Right ventricle: The cavity size was normal. Wall thickness was   normal. Systolic function was normal. RV systolic pressure (S,   est): 29 mm Hg. - Right atrium: The atrium was normal in size. - Tricuspid valve: There was trivial regurgitation. - Inferior vena cava: The vessel was normal in size. The   respirophasic diameter changes  were in the normal range (>= 50%),   consistent with normal central venous pressure. - Pericardium, extracardiac: There was no pericardial effusion.  Antimicrobials:  Anti-infectives (From admission, onward)   Start     Dose/Rate Route Frequency Ordered Stop   04/16/18 0600  ceFAZolin (ANCEF) IVPB 2g/100 mL premix  Status:  Discontinued     2 g 200 mL/hr over 30 Minutes Intravenous On call to O.R. 04/15/18 2104 04/16/18 1558   04/13/18 2230  azithromycin (ZITHROMAX) 500 mg in sodium chloride 0.9 % 250 mL IVPB  Status:  Discontinued     500 mg 250 mL/hr over 60 Minutes Intravenous Every 24 hours 04/13/18 2130 04/15/18 1556   04/13/18 2200  doxycycline (VIBRA-TABS) tablet 100 mg  Status:  Discontinued     100 mg Oral Every 12 hours 04/13/18 1928 04/13/18 2130   04/13/18 1930  cefTRIAXone (ROCEPHIN) 1 g in sodium chloride 0.9 % 100 mL IVPB  Status:  Discontinued     1 g 200 mL/hr over 30 Minutes Intravenous Every 24 hours 04/13/18 1928 04/15/18 1556     Subjective: Concerned about diarrhea Otherwise, waiting for placement Children at bedside  Objective: Vitals:   04/17/18 1347 04/17/18 2038 04/18/18 0549 04/18/18 1340  BP: 113/62 120/72 120/72 138/73  Pulse: 84 80 86 79  Resp: 20 17 19 18   Temp: 98 F (36.7 C) (!) 97.5 F (36.4 C) 97.7 F (36.5  C) 98.7 F (37.1 C)  TempSrc:      SpO2: 100% 99% 94% 97%  Weight:      Height:       No intake or output data in the 24 hours ending 04/18/18 2013 Filed Weights   04/13/18 1638  Weight: 45.4 kg    Examination:  General exam: Appears calm and comfortable  Respiratory system: Clear to auscultation. Respiratory effort normal. Cardiovascular system: S1 & S2 heard, RRR. + murmur Gastrointestinal system: Abdomen is nondistended, soft and nontender.  Central nervous system: Alert and oriented. No focal neurological deficits. Extremities: no lee Skin: No rashes, lesions or ulcers Psychiatry: Judgement and insight appear normal.  Mood & affect appropriate.     Data Reviewed: I have personally reviewed following labs and imaging studies  CBC: Recent Labs  Lab 04/13/18 1740 04/14/18 0656 04/14/18 1701 04/15/18 0544 04/16/18 0617 04/17/18 0825 04/18/18 0604  WBC 5.2 4.2  --  4.4 5.1 5.6 6.9  NEUTROABS 4.2  --   --  2.8 3.6 4.1  --   HGB 10.7* 8.9* 9.9* 9.0* 8.6* 8.7* 9.6*  HCT 33.5* 28.1* 31.8* 29.1* 27.8* 28.4* 31.4*  MCV 95.7 96.9  --  97.0 97.2 98.3 96.6  PLT 216 186  --  182 171 172 229   Basic Metabolic Panel: Recent Labs  Lab 04/13/18 1740 04/14/18 0656 04/15/18 0544 04/16/18 0617 04/17/18 0825 04/18/18 0604  NA 141 138 142 142 141 140  K 3.2* 3.6 3.6 3.2* 3.5 3.7  CL 101 102 110 111 108 107  CO2 28 26 23 22 23 22   GLUCOSE 132* 82 72 84 54* 88  BUN 51* 40* 33* 29* 33* 38*  CREATININE 1.09* 0.92 0.97 0.82 0.99 0.95  CALCIUM 9.7 8.9 8.8* 8.4* 8.5* 8.8*  MG 2.3  --   --  1.9  --   --    GFR: Estimated Creatinine Clearance: 32.7 mL/min (by C-G formula based on SCr of 0.95 mg/dL). Liver Function Tests: Recent Labs  Lab 04/13/18 1740 04/14/18 0656 04/15/18 0544  AST 103* 111* 125*  ALT 30 27 28   ALKPHOS 249* 221* 310*  BILITOT 0.7 0.5 0.6  PROT 6.6 5.3* 5.4*  ALBUMIN 3.2* 2.5* 2.4*   Recent Labs  Lab 04/13/18 1740 04/14/18 0656 04/15/18 0544  LIPASE 321* 190* 74*   No results for input(s): AMMONIA in the last 168 hours. Coagulation Profile: Recent Labs  Lab 04/14/18 0656  INR 0.97   Cardiac Enzymes: Recent Labs  Lab 04/13/18 1740  TROPONINI <0.03   BNP (last 3 results) No results for input(s): PROBNP in the last 8760 hours. HbA1C: No results for input(s): HGBA1C in the last 72 hours. CBG: No results for input(s): GLUCAP in the last 168 hours. Lipid Profile: No results for input(s): CHOL, HDL, LDLCALC, TRIG, CHOLHDL, LDLDIRECT in the last 72 hours. Thyroid Function Tests: No results for input(s): TSH, T4TOTAL, FREET4, T3FREE, THYROIDAB in the last 72  hours. Anemia Panel: No results for input(s): VITAMINB12, FOLATE, FERRITIN, TIBC, IRON, RETICCTPCT in the last 72 hours. Sepsis Labs: Recent Labs  Lab 04/13/18 1818 04/13/18 2316  LATICACIDVEN 1.6 1.5    Recent Results (from the past 240 hour(s))  Culture, blood (routine x 2)     Status: None (Preliminary result)   Collection Time: 04/13/18 10:02 PM  Result Value Ref Range Status   Specimen Description BLOOD RIGHT WRIST  Final   Special Requests   Final    BOTTLES DRAWN AEROBIC AND  ANAEROBIC Blood Culture adequate volume Performed at [facility] [address] [place], [place] [address]    Culture   Final    NO GROWTH 4 DAYS Performed at [facility], [place] [address] [place], [place] [address]    Report Status PENDING  Incomplete  Culture, blood (routine x 2)     Status: None (Preliminary result)   Collection Time: 04/13/18 10:02 PM  Result Value Ref Range Status   Specimen Description   Final    BLOOD RIGHT HAND Performed at [facility], [place] [address], [place], [place] [address]    Special Requests   Final    BOTTLES DRAWN AEROBIC ONLY Blood Culture adequate volume Performed at [place] [address], [place], [place] [address]    Culture   Final    NO GROWTH 4 DAYS Performed at [facility], [place] [address], [place], [place] [address]    Report Status PENDING  Incomplete  MRSA PCR Screening     Status: None   Collection Time: 04/15/18 11:04 PM  Result Value Ref Range Status   MRSA by PCR NEGATIVE NEGATIVE Final    Comment:        The GeneXpert MRSA Assay (FDA approved for NASAL specimens only), is one component of [facility] comprehensive MRSA colonization surveillance program. It is not intended to diagnose MRSA infection nor to guide or monitor treatment for MRSA infections. Performed at [facility], [place] [address] [place], [place] [address]          Radiology Studies: No  results found.      Scheduled Meds: . anastrozole  1 mg Oral Daily  . feeding supplement (ENSURE ENLIVE)  237 mL Oral BID BM  . fluticasone  2 spray Each Nare Daily  . guaiFENesin  1,200 mg Oral BID  . loratadine  10 mg Oral Daily  . pantoprazole (PROTONIX) IV  40 mg Intravenous Q12H  . simethicone  160 mg Oral QID   Continuous Infusions: . lactated ringers 100 mL/hr at 04/17/18 0352     LOS: 5 days    Time spent: over 30 min    Fayrene Helper, MD [facility] Pager [phone]  If 7PM-7AM, please contact night-coverage [email or web address] Password [facility] 04/18/2018, 8:13 PM

## 2018-04-18 NOTE — Progress Notes (Signed)
Patient and daughter given bed offers.   LCSW awaiting bed choice.   Once bed choice is made, patient will need [employer] auth.   Carolin Coy [facility] [place]

## 2018-04-18 NOTE — Progress Notes (Signed)
Morgan Morrow   DOB:January 06, 1941   ZY#:[id number]   UXN#:[id number]  Subjective:  Halena is still very weak.  She tells me she sat up about 4 hours yesterday with the help of occupational therapy.  She had a fairly normal bowel movement today.  The diarrhea does appear to have resolved.  She denies pain.  No family in room  Objective: Older white woman examined in bed Vitals:   04/17/18 2038 04/18/18 0549  BP: 120/72 120/72  Pulse: 80 86  Resp: 17 19  Temp: (!) 97.5 F (36.4 C) 97.7 F (36.5 C)  SpO2: 99% 94%    Body mass index is 20.2 kg/m.  Intake/Output Summary (Last 24 hours) at 04/18/2018 0814 Last data filed at 04/17/2018 1353 Gross per 24 hour  Intake 60 ml  Output -  Net 60 ml    Sclerae unicteric, pupils round and equal Oropharynx slightly dry Lungs no rales or rhonchi, auscultated anterolaterally Heart regular rate and rhythm Abd soft, nontender, positive bowel sounds Neuro: nonfocal, well oriented, appropriate affect Breasts: Deferred; see consult note for photo  CBG (last 3)  No results for input(s): GLUCAP in the last 72 hours.   Labs:  Lab Results  Component Value Date   WBC 6.9 04/18/2018   HGB 9.6 (L) 04/18/2018   HCT 31.4 (L) 04/18/2018   MCV 96.6 04/18/2018   PLT 218 04/18/2018   NEUTROABS 4.1 04/17/2018    @LASTCHEMISTRY @  Urine Studies No results for input(s): UHGB, CRYS in the last 72 hours.  Invalid input(s): UACOL, UAPR, USPG, UPH, UTP, UGL, [place], UBIL, UNIT, UROB, [place], UEPI, [place], [place]  Basic Metabolic Panel: Recent Labs  Lab 04/13/18 1740 04/14/18 0656 04/15/18 0544 04/16/18 0617 04/17/18 0825 04/18/18 0604  NA 141 138 142 142 141 140  K 3.2* 3.6 3.6 3.2* 3.5 3.7  CL 101 102 110 111 108 107  CO2 28 26 23 22 23 22   GLUCOSE 132* 82 72 84 54* 88  BUN 51* 40* 33* 29* 33* 38*  CREATININE 1.09* 0.92 0.97 0.82 0.99 0.95  CALCIUM 9.7 8.9 8.8* 8.4* 8.5* 8.8*  MG 2.3  --   --  1.9  --   --    GFR Estimated  Creatinine Clearance: 32.7 mL/min (by C-G formula based on SCr of 0.95 mg/dL). Liver Function Tests: Recent Labs  Lab 04/13/18 1740 04/14/18 0656 04/15/18 0544  AST 103* 111* 125*  ALT 30 27 28   ALKPHOS 249* 221* 310*  BILITOT 0.7 0.5 0.6  PROT 6.6 5.3* 5.4*  ALBUMIN 3.2* 2.5* 2.4*   Recent Labs  Lab 04/13/18 1740 04/14/18 0656 04/15/18 0544  LIPASE 321* 190* 74*   No results for input(s): AMMONIA in the last 168 hours. Coagulation profile Recent Labs  Lab 04/14/18 0656  INR 0.97    CBC: Recent Labs  Lab 04/13/18 1740 04/14/18 0656 04/14/18 1701 04/15/18 0544 04/16/18 0617 04/17/18 0825 04/18/18 0604  WBC 5.2 4.2  --  4.4 5.1 5.6 6.9  NEUTROABS 4.2  --   --  2.8 3.6 4.1  --   HGB 10.7* 8.9* 9.9* 9.0* 8.6* 8.7* 9.6*  HCT 33.5* 28.1* 31.8* 29.1* 27.8* 28.4* 31.4*  MCV 95.7 96.9  --  97.0 97.2 98.3 96.6  PLT 216 186  --  182 171 172 218   Cardiac Enzymes: Recent Labs  Lab 04/13/18 1740  TROPONINI <0.03   BNP: Invalid input(s): POCBNP CBG: No results for input(s): GLUCAP in the last  168 hours. D-Dimer No results for input(s): DDIMER in the last 72 hours. Hgb A1c No results for input(s): HGBA1C in the last 72 hours. Lipid Profile No results for input(s): CHOL, HDL, LDLCALC, TRIG, CHOLHDL, LDLDIRECT in the last 72 hours. Thyroid function studies No results for input(s): TSH, T4TOTAL, T3FREE, THYROIDAB in the last 72 hours.  Invalid input(s): FREET3 Anemia work up No results for input(s): VITAMINB12, FOLATE, FERRITIN, TIBC, IRON, RETICCTPCT in the last 72 hours. Microbiology Recent Results (from the past 240 hour(s))  Culture, blood (routine x 2)     Status: None (Preliminary result)   Collection Time: 04/13/18 10:02 PM  Result Value Ref Range Status   Specimen Description BLOOD RIGHT WRIST  Final   Special Requests   Final    BOTTLES DRAWN AEROBIC AND ANAEROBIC Blood Culture adequate volume Performed at [facility]  [address] [place], [place] [address]    Culture   Final    NO GROWTH 3 DAYS Performed at [facility], [place] [address] [place], [place] [address]    Report Status PENDING  Incomplete  Culture, blood (routine x 2)     Status: None (Preliminary result)   Collection Time: 04/13/18 10:02 PM  Result Value Ref Range Status   Specimen Description   Final    BLOOD RIGHT HAND Performed at [facility], [place] [address], [place], [place] [address]    Special Requests   Final    BOTTLES DRAWN AEROBIC ONLY Blood Culture adequate volume Performed at [facility] [address] [place], [place] [address]    Culture   Final    NO GROWTH 3 DAYS Performed at [facility], [place] [address] [place], [place] [address]    Report Status PENDING  Incomplete  MRSA PCR Screening     Status: None   Collection Time: 04/15/18 11:04 PM  Result Value Ref Range Status   MRSA by PCR NEGATIVE NEGATIVE Final    Comment:        The GeneXpert MRSA Assay (FDA approved for NASAL specimens only), is one component of a comprehensive MRSA colonization surveillance program. It is not intended to diagnose MRSA infection nor to guide or monitor treatment for MRSA infections. Performed at [facility], [place] [address], [place], [place] [address]       Studies:  [place] Chest & Delayed Img Double Cm  Result Date: 04/16/2018 CLINICAL DATA:  78 year old female with dysphagia nausea and vomiting. Metastatic breast cancer. Subsequent encounter. EXAM: ESOPHOGRAM/BARIUM SWALLOW TECHNIQUE: Single contrast examination was performed using  thin barium. FLUOROSCOPY TIME:  Fluoroscopy Time:  1 minutes and 6 seconds Radiation Exposure Index: 6.5 mGy COMPARISON:  04/15/2018 speech swallow.  04/13/2018 chest CT. FINDINGS: Exam was tailored to the patient. Single contrast exam performed with thin barium. No laryngeal aspiration occurred with chin tuck position.  Slightly blunted primary esophageal stripping wave. No esophageal obstructing lesion. Mild smooth narrowing of the distal esophagus just above small hiatal hernia. 2 cm proximal to this level, there is a small circumferential esophageal stricture. The esophagus just above this level has a slightly lobulated appearance left lateral aspect. Cause of strictures indeterminate. Retained secretions limited evaluation esophageal mucosa. Patient attempted but was not able to swallow barium tablet. IMPRESSION: 1. Exam was tailored to the patient. 2. No laryngeal aspiration occurred with chin tuck position. 3. Slightly blunted primary esophageal stripping wave. 4. No esophageal obstructing lesion.  5. Mild smooth narrowing of the distal esophagus just above small hiatal hernia. 2 cm proximal to this level, there is a small circumferential esophageal stricture. The esophagus just above this stricture has a slightly lobulated appearance along left lateral aspect. Cause of stricture is indeterminate. 6. Retained secretions limited evaluation of esophageal mucosa. 7. Patient attempted but was not able to swallow barium tablet (poor oropharyngeal phase). Electronically Signed   By: Genia Del M.D.   On: 04/16/2018 10:14    Assessment: 78 y.o. [place] womanpresenting with weight loss and failure to thrive as well as persistent diarrhea, CT abdomen and pelvis 04/13/2018 showing lytic bone lesions, possible liver and lung lesions, and evidence of proctocolitis, with exam showing an obvious right-sided breast cancer as imaged above  (1) metastatic breast cancer: Biopsy of right breast and scalp lesion 04/16/2018, results pending  (2) staging studies:             (a) CT scans of the C/A/P/brain 01/10-02/2019 show lung, liver and bone lesions, no brain involvement  (3) chaplain service to assist patient in completing healthcare power of attorney documents  (4) anastrozole started 04/17/2018  (a) we will add  palbociclib at visit 04/27/2018     Plan:  Baleigh is stable, but still very weak.  She likely will need some time in a rehab facility or other SNF to pick up some steam.  She is on anastrozole now and the plan is to continue that and then add palbociclib when she returns to see me as an outpatient 04/27/2018.  The appointment is at 12:30 PM and I gave it in writing to the patient today.  I will be out of town until next week.  Please consult 1 of my partners as needed.  I will sign off at this point   Chauncey Cruel, MD 04/18/2018  8:14 AM Medical Oncology and Hematology [facility] [address] [place], [place] [address] Tel. [phone]    Fax. [phone]

## 2018-04-19 ENCOUNTER — Telehealth: Payer: Self-pay | Admitting: Oncology

## 2018-04-19 LAB — CBC
HCT: 26.5 % — ABNORMAL LOW (ref 36.0–46.0)
Hemoglobin: 8.4 g/dL — ABNORMAL LOW (ref 12.0–15.0)
MCH: 30.8 pg (ref 26.0–34.0)
MCHC: 31.7 g/dL (ref 30.0–36.0)
MCV: 97.1 fL (ref 80.0–100.0)
Platelets: 192 10*3/uL (ref 150–400)
RBC: 2.73 MIL/uL — ABNORMAL LOW (ref 3.87–5.11)
RDW: 13.7 % (ref 11.5–15.5)
WBC: 6.1 10*3/uL (ref 4.0–10.5)
nRBC: 0 % (ref 0.0–0.2)

## 2018-04-19 LAB — COMPREHENSIVE METABOLIC PANEL
ALT: 20 U/L (ref 0–44)
AST: 55 U/L — ABNORMAL HIGH (ref 15–41)
Albumin: 1.8 g/dL — ABNORMAL LOW (ref 3.5–5.0)
Alkaline Phosphatase: 270 U/L — ABNORMAL HIGH (ref 38–126)
Anion gap: 7 (ref 5–15)
BUN: 29 mg/dL — ABNORMAL HIGH (ref 8–23)
CHLORIDE: 107 mmol/L (ref 98–111)
CO2: 24 mmol/L (ref 22–32)
Calcium: 8.2 mg/dL — ABNORMAL LOW (ref 8.9–10.3)
Creatinine, Ser: 0.72 mg/dL (ref 0.44–1.00)
GFR calc Af Amer: >60 mL/min (ref >60)
GFR calc non Af Amer: >60 mL/min (ref >60)
Glucose, Bld: 79 mg/dL (ref 70–99)
POTASSIUM: 3.4 mmol/L — AB (ref 3.5–5.1)
Sodium: 138 mmol/L (ref 135–145)
Total Bilirubin: 0.8 mg/dL (ref 0.3–1.2)
Total Protein: 4.6 g/dL — ABNORMAL LOW (ref 6.5–8.1)

## 2018-04-19 LAB — URINALYSIS, ROUTINE W REFLEX MICROSCOPIC
Bacteria, UA: NONE SEEN
Bilirubin Urine: NEGATIVE
Glucose, UA: NEGATIVE mg/dL
Ketones, ur: NEGATIVE mg/dL
NITRITE: NEGATIVE
Protein, ur: NEGATIVE mg/dL
Specific Gravity, Urine: 1.016 (ref 1.005–1.030)
pH: 5 (ref 5.0–8.0)

## 2018-04-19 LAB — CULTURE, BLOOD (ROUTINE X 2)
Culture: NO GROWTH
Culture: NO GROWTH
SPECIAL REQUESTS: ADEQUATE
Special Requests: ADEQUATE

## 2018-04-19 LAB — MAGNESIUM: Magnesium: 1.9 mg/dL (ref 1.7–2.4)

## 2018-04-19 MED ORDER — POTASSIUM CHLORIDE CRYS ER 20 MEQ PO TBCR
40.0000 meq | EXTENDED_RELEASE_TABLET | Freq: Once | ORAL | Status: AC
  Administered 2018-04-19: 40 meq via ORAL
  Filled 2018-04-19: qty 2

## 2018-04-19 NOTE — Progress Notes (Signed)
PROGRESS NOTE    Morgan Morrow  ZYS:[id number] DOB: [birth year] DOA: 04/13/2018 PCP: Leighton Ruff, MD   Brief Narrative:  Per Dr Jena Gauss Morgan Morrow 78 y.o.femalewith medical history significant forhtn, who presented with weight loss and diarrhea.    History obtained from patient and her daughter.  Daughter reported decline in health over the past year, getting more rapid. Significant weight loss, difficulty swallowing, hoarseness of voice, difficulty with mobility, and increased confusion. Has Morgan Morrow PCP, was last seen in approximately November, patient and family unaware of results of that work-up.  Presented to ED mainly because daughter says patient finally relented, which she explains to mean she has been concerned about her mother's health for some time. The only acute change is about 2 weeks of diarrhea, no blood or mucous. No fevers, no recent travel or antibiotics. No abdominal pain. Denies cough or shortness of breath. Denies fevers or recent falls.  ED Course:fecal disempaction, labs, antibiotics, fluids, ct, CXR   Assessment & Plan:   Principal Problem:   Metastatic breast cancer ([place]) Active Problems:   Cachexia ([place])   Hematochezia   Community acquired pneumonia   Constipation, chronic   Osteolytic lesion   FTT (failure to thrive) in adult   Anemia   Murmur, cardiac   Diarrhea   Fecal impaction in rectum ([facility])   Hypokalemia   Breast mass, probable cancer   Scalp mass, probable breast cancer metastasis   Stercoral proctocolitis of rectum   Malnutrition of moderate degree   1 Suspected metastatic breast cancer  FTT Patient notes to have right breast cancer, weight loss, increasing disability.  CT chest with concern for ill defined pulm nodules concerning for metastatic disease, bony metastatic involvement, hypodense liver lesions concerning for metastatic involvement  CT abdomen pelvis with ill define pulm opacities at the lung base concerning  for possible metastatic disease, extensive osteolytic abnormality of axial and appendicular skeleton (concerning for metastassi of unknown primary or myeloma), 3 hypodense lesions of liver, and concern for stercoral proctocolitis Oncology following, appreciate recs  Elevated CA 27.29 Bx from 1/13 R breast and top of scalp c/w carcinoma (core bx of breast and 2 scalp lesions by surgery on 1/13) ER/PR positive and HER2 negative Started anastrozole per oncology, planning to add palbociclib at follow up visit   2.  Dysphagia Concern for dysphagia.  Not clear whether liquids/solids CT abdomen and pelvis with esophageal thickening.  Patient with significant weight loss, debility, hoarseness of voice.   GI c/s, appreciate recs Barium swallow notable for mild smooth narrowing of distal esophagus above small hiatal hernia.  Small circumferential esophageal stricture.  Slightly lobultated appearance along L lateral aspect above stricture.  Cause of stricture indeterminate. Per GI note on 1/13, pt/family declined EGD (if bx not conclusive, planning to reconsider EGD) Continue PPI Appreciate speech recs - recommending considering outpatient ENT referral for hoarseness and her metastatic cancer - regular, thin liquid (see below)  Diet recommendations: Regular;Thin liquid Liquids provided via: Cup(if can conduct chin tuck posture adequately) Medication Administration: Crushed with puree(if large) Supervision: Patient able to self feed Compensations: Slow rate;Small sips/bites;Minimize environmental distractions;Chin tuck(start intake with liquids) Postural Changes and/or Swallow Maneuvers: Seated upright 90 degrees;Upright 30-60 min after meal      Oral Care Recommendations: Oral care BID SLP Visit Diagnosis: Dysphagia, oropharyngeal phase (R13.12) Plan: Continue with current plan of care         Oxygen Requirement: pt has been weaned to room air today.  Continue to monitor, if this returns will  need further workup.  Anemia: labs suggestive of AOCD.  Normal B12 and folate.  Positive FOBT.  Seen by GI.  Thought heme positive stool related to fecal impaction.  Recommended aggressive laxatives.  See prior notes.  CTM, stable.  # Abdominal Discomfort: better after belching, has simethicone ordered, continue to monitor  3.  Fecal impaction/constipation/stercoral ulcer of the anus Holding laxatives at this point in time with diarrhea this AM Continue to monitor Miralax currently ordered prn Will need continued close follow up regarding this, would continue miralax to ensure she has 1 soft stool daily (hold as needed)  5.  Murmur Noted on examination.  Patient states she has had Morgan Morrow murmur for [name] long time.  2D echo with EF 55-60%, mild AV stenosis.  No further work-up needed at this time.  Outpatient follow-up.  6.  Diarrhea Holding laxatives for now, miralax prn, follow  7.  Hypokalemia Magnesium level at 2.3.  Replete.  8.  Elevated lipase/ Patient denies any epigastric abdominal pain.  Tolerating diet.  Follow.  9.  ??? Community-acquired pneumonia Chest x-ray was suggestive of [name] pneumonia.  CT findings of abdomen and pelvis most suggestive of metastatic process ongoing.  Patient noted to be hypothermic and hypotensive on admission.  Patient pancultured.  CT chest done was negative for middle lobe pneumonia and findings noted on chest x-ray was felt to be secondary to breast cancer.  IV antibiotics have been discontinued. Follow.   10.  Hypothermia Likely secondary to metastatic cancer malnutrition.  Patient also noted to have concerns for community-acquired pneumonia on admission.  Off Retail banker.  Hypothermia seems to have resolved.  CT chest negative for pneumonia.  IV antibiotics have been discontinued.  Follow.  DVT prophylaxis: SCD Code Status: full  Family Communication: son daughter at bedside Disposition Plan: pending SNF placement - Pt frail and with gradual  decline over the past several months with failure to thrive.  Inpatient workup felt appropriate given her frailty and failure to thrive, and in the setting of findings concerning for metastatic cancer.   Consultants:   Oncology  Gastroenterology  surgery  Procedures:   Core biopsies of breast and 2 scalp lesions on 1/13 Echo Study Conclusions  - Left ventricle: The cavity size was normal. Wall thickness was   normal. Systolic function was normal. The estimated ejection   fraction was in the range of 55% to 60%. Wall motion was normal;   there were no regional wall motion abnormalities. Doppler   parameters are consistent with abnormal left ventricular   relaxation (grade 1 diastolic dysfunction). - Aortic valve: There was mild stenosis. There was trivial   regurgitation. Mean gradient (S): 15 mm Hg. - Mitral valve: Moderately calcified annulus. There was mild to   moderate regurgitation. - Left atrium: The atrium was normal in size. - Right ventricle: The cavity size was normal. Wall thickness was   normal. Systolic function was normal. RV systolic pressure (S,   est): 29 mm Hg. - Right atrium: The atrium was normal in size. - Tricuspid valve: There was trivial regurgitation. - Inferior vena cava: The vessel was normal in size. The   respirophasic diameter changes were in the normal range (>= 50%),   consistent with normal central venous pressure. - Pericardium, extracardiac: There was no pericardial effusion.  Antimicrobials:  Anti-infectives (From admission, onward)   Start     Dose/Rate Route Frequency Ordered Stop   04/16/18 0600  ceFAZolin (ANCEF) IVPB 2g/100 mL premix  Status:  Discontinued     2 g 200 mL/hr over 30 Minutes Intravenous On call to O.R. 04/15/18 2104 04/16/18 1558   04/13/18 2230  azithromycin (ZITHROMAX) 500 mg in sodium chloride 0.9 % 250 mL IVPB  Status:  Discontinued     500 mg 250 mL/hr over 60 Minutes Intravenous Every 24 hours 04/13/18 2130  04/15/18 1556   04/13/18 2200  doxycycline (VIBRA-TABS) tablet 100 mg  Status:  Discontinued     100 mg Oral Every 12 hours 04/13/18 1928 04/13/18 2130   04/13/18 1930  cefTRIAXone (ROCEPHIN) 1 g in sodium chloride 0.9 % 100 mL IVPB  Status:  Discontinued     1 g 200 mL/hr over 30 Minutes Intravenous Every 24 hours 04/13/18 1928 04/15/18 1556     Subjective: Notes gas discomfort, better when belching. Notes some loose stools, not watery, just not formed.  Objective: Vitals:   04/18/18 2025 04/19/18 0329 04/19/18 1434 04/19/18 1502  BP: 136/78 134/78 129/66   Pulse: 84 88 88 99  Resp: 20 (!) 22 (!) 24   Temp: 97.8 F (36.6 C) (!) 97.5 F (36.4 C) (!) 97.5 F (36.4 C)   TempSrc: Oral Oral Oral   SpO2: 97% 94% 98% 93%  Weight:      Height:        Intake/Output Summary (Last 24 hours) at 04/19/2018 2009 Last data filed at 04/19/2018 1857 Gross per 24 hour  Intake 2267.06 ml  Output -  Net 2267.06 ml   Filed Weights   04/13/18 1638  Weight: 45.4 kg    Examination:  General: No acute distress. Cardiovascular: Heart sounds show [place] regular rate, and rhythm.  Lungs: Clear to auscultation bilaterally  Abdomen: Soft, nontender, nondistended  Neurological: Alert and oriented. Moves all extremities 4. Cranial nerves II through XII grossly intact. Skin: Warm and dry. No rashes or lesions. Extremities: No clubbing or cyanosis. No edema.  Psychiatric: Mood and affect are normal. Insight and judgment are appropriate.   Data Reviewed: I have personally reviewed following labs and imaging studies  CBC: Recent Labs  Lab 04/13/18 1740  04/15/18 0544 04/16/18 0617 04/17/18 0825 04/18/18 0604 04/19/18 0527  WBC 5.2   < > 4.4 5.1 5.6 6.9 6.1  NEUTROABS 4.2  --  2.8 3.6 4.1  --   --   HGB 10.7*   < > 9.0* 8.6* 8.7* 9.6* 8.4*  HCT 33.5*   < > 29.1* 27.8* 28.4* 31.4* 26.5*  MCV 95.7   < > 97.0 97.2 98.3 96.6 97.1  PLT 216   < > 182 171 172 218 192   < > = values in this  interval not displayed.   Basic Metabolic Panel: Recent Labs  Lab 04/13/18 1740  04/15/18 0544 04/16/18 0617 04/17/18 0825 04/18/18 0604 04/19/18 0527  NA 141   < > 142 142 141 140 138  K 3.2*   < > 3.6 3.2* 3.5 3.7 3.4*  CL 101   < > 110 111 108 107 107  CO2 28   < > 23 22 23 22 24   GLUCOSE 132*   < > 72 84 54* 88 79  BUN 51*   < > 33* 29* 33* 38* 29*  CREATININE 1.09*   < > 0.97 0.82 0.99 0.95 0.72  CALCIUM 9.7   < > 8.8* 8.4* 8.5* 8.8* 8.2*  MG 2.3  --   --  1.9  --   --  1.9   < > = values in this interval not displayed.   GFR: Estimated Creatinine Clearance: 38.9 mL/min (by C-G formula based on SCr of 0.72 mg/dL). Liver Function Tests: Recent Labs  Lab 04/13/18 1740 04/14/18 0656 04/15/18 0544 04/19/18 0527  AST 103* 111* 125* 55*  ALT 30 27 28 20   ALKPHOS 249* 221* 310* 270*  BILITOT 0.7 0.5 0.6 0.8  PROT 6.6 5.3* 5.4* 4.6*  ALBUMIN 3.2* 2.5* 2.4* 1.8*   Recent Labs  Lab 04/13/18 1740 04/14/18 0656 04/15/18 0544  LIPASE 321* 190* 74*   No results for input(s): AMMONIA in the last 168 hours. Coagulation Profile: Recent Labs  Lab 04/14/18 0656  INR 0.97   Cardiac Enzymes: Recent Labs  Lab 04/13/18 1740  TROPONINI <0.03   BNP (last 3 results) No results for input(s): PROBNP in the last 8760 hours. HbA1C: No results for input(s): HGBA1C in the last 72 hours. CBG: No results for input(s): GLUCAP in the last 168 hours. Lipid Profile: No results for input(s): CHOL, HDL, LDLCALC, TRIG, CHOLHDL, LDLDIRECT in the last 72 hours. Thyroid Function Tests: No results for input(s): TSH, T4TOTAL, FREET4, T3FREE, THYROIDAB in the last 72 hours. Anemia Panel: No results for input(s): VITAMINB12, FOLATE, FERRITIN, TIBC, IRON, RETICCTPCT in the last 72 hours. Sepsis Labs: Recent Labs  Lab 04/13/18 1818 04/13/18 2316  LATICACIDVEN 1.6 1.5    Recent Results (from the past 240 hour(s))  Culture, blood (routine x 2)     Status: None   Collection Time:  04/13/18 10:02 PM  Result Value Ref Range Status   Specimen Description BLOOD RIGHT WRIST  Final   Special Requests   Final    BOTTLES DRAWN AEROBIC AND ANAEROBIC Blood Culture adequate volume Performed at [facility] [address], [place], [place] [address]    Culture   Final    NO GROWTH 5 DAYS Performed at [facility], [place] [address], [place], [place] [address]    Report Status 04/19/2018 FINAL  Final  Culture, blood (routine x 2)     Status: None   Collection Time: 04/13/18 10:02 PM  Result Value Ref Range Status   Specimen Description   Final    BLOOD RIGHT HAND Performed at [facility], [place] [address] [place], [place] [address]    Special Requests   Final    BOTTLES DRAWN AEROBIC ONLY Blood Culture adequate volume Performed at [facility] [address], [place], [place] [address]    Culture   Final    NO GROWTH 5 DAYS Performed at [facility], [place] [address], [place], [place] [address]    Report Status 04/19/2018 FINAL  Final  MRSA PCR Screening     Status: None   Collection Time: 04/15/18 11:04 PM  Result Value Ref Range Status   MRSA by PCR NEGATIVE NEGATIVE Final    Comment:        The GeneXpert MRSA Assay (FDA approved for NASAL specimens only), is one component of [place] comprehensive MRSA colonization surveillance program. It is not intended to diagnose MRSA infection nor to guide or monitor treatment for MRSA infections. Performed at [facility], [place] [address], [place], [place] [address]          Radiology Studies: No results found.      Scheduled Meds: . anastrozole  1 mg Oral Daily  . feeding supplement (ENSURE ENLIVE)  237 mL Oral BID BM  .  fluticasone  2 spray Each Nare Daily  . guaiFENesin  1,200 mg Oral BID  . loratadine  10 mg Oral Daily  . pantoprazole (PROTONIX) IV  40 mg Intravenous Q12H  . simethicone  160 mg Oral QID   Continuous Infusions: .  lactated ringers 100 mL/hr at 04/19/18 0800     LOS: 6 days    Time spent: over 30 min    Fayrene Helper, MD [facility] Pager [phone]  If 7PM-7AM, please contact night-coverage [email or web address] Password [facility] 04/19/2018, 8:09 PM

## 2018-04-19 NOTE — Progress Notes (Signed)
Physical Therapy Treatment Patient Details Name: Morgan Morrow MRN: [id number] DOB: 02-09-1941 Today's Date: 04/19/2018    History of Present Illness 78 yo female adm to [facility] on 04/14/11 with wt loss, diarrhea, health declining over last few years per family; breast mass with numerous subcutaneous and other lesions, probably metastatic breast cancer, to go for bx today;    PT Comments    Progressing with mobility. Pt remains unsteady and at risk for falls. O2 sat dropped to 88% on RA with activity. Replaced Circle O2 once back in recliner. Continue to recommend SNF.    Follow Up Recommendations  SNF     Equipment Recommendations  (TBD at next venue)    Recommendations for Other Services       Precautions / Restrictions Precautions Precautions: Fall Restrictions Weight Bearing Restrictions: No    Mobility  Bed Mobility               General bed mobility comments: oob in recliner  Transfers Overall transfer level: Needs assistance Equipment used: Rolling walker (2 wheeled) Transfers: Sit to/from Stand Sit to Stand: Min assist         General transfer comment: Assist to rise, stabilize, control descent. VCs safety, hand placement  Ambulation/Gait Ambulation/Gait assistance: Min assist Gait Distance (Feet): 15 Feet(x2) Assistive device: Rolling walker (2 wheeled) Gait Pattern/deviations: Step-through pattern;Decreased stride length     General Gait Details: Assist to stabilize pt throughout distanace. Cues for safety, pacing. Pt fatigues easily. O2 sat dropped to 88% on RA   Stairs             Wheelchair Mobility    Modified Rankin (Stroke Patients Only)       Balance Overall balance assessment: Needs assistance         Standing balance support: Bilateral upper extremity supported Standing balance-Leahy Scale: Poor                              Cognition Arousal/Alertness: Awake/alert Behavior During Therapy: WFL for tasks  assessed/performed Overall Cognitive Status: Within Functional Limits for tasks assessed                                        Exercises      General Comments        Pertinent Vitals/Pain Pain Assessment: No/denies pain    Home Living Family/patient expects to be discharged to:: Skilled nursing facility                    Prior Function            PT Goals (current goals can now be found in the care plan section) Progress towards PT goals: Progressing toward goals    Frequency    Min 2X/week      PT Plan Current plan remains appropriate    Co-evaluation              AM-PAC PT "6 Clicks" Mobility   Outcome Measure  Help needed turning from your back to your side while in a flat bed without using bedrails?: A Little Help needed moving from lying on your back to sitting on the side of a flat bed without using bedrails?: A Little Help needed moving to and from a bed to a chair (including a wheelchair)?: A Little Help needed  standing up from a chair using your arms (e.g., wheelchair or bedside chair)?: A Little Help needed to walk in hospital room?: A Little Help needed climbing 3-5 steps with a railing? : A Lot 6 Click Score: 17    End of Session   Activity Tolerance: Patient limited by fatigue Patient left: in chair;with call bell/phone within reach;with chair alarm set   PT Visit Diagnosis: Muscle weakness (generalized) (M62.81);Unsteadiness on feet (R26.81)     Time: 7035-0093 PT Time Calculation (min) (ACUTE ONLY): 13 min  Charges:  $Gait Training: 8-22 mins                        Weston Anna, PT Acute Rehabilitation Services Pager: [phone] Office: [phone]

## 2018-04-19 NOTE — Progress Notes (Addendum)
  Speech Language Pathology Treatment: Dysphagia  Patient Details Name: Morgan Morrow MRN: [id number] DOB: 03/07/1941 Today's Date: 04/19/2018 Time: 4098-1191 SLP Time Calculation (min) (ACUTE ONLY): 35 min  Assessment / Plan / Recommendation Clinical Impression  Today pt sitting upright in chair upon SLP arrival.  Complains of diarrhea and gas - observed frequently belching.  Provided pt with soda in hopes to alleviate gas discomfort. Pt benefited from moderate cues to tuck chin fully with liquids.  No indications of aspiration with adequate of chin tuck.  Reviewed flouro loops of MBS that showed trace aspiration of thin.  Advised she consider not using straws if she consumes too much air causing worse gas/belching. Pt stated "you told me to use them" and straws can facilitate chin tuck posture with intake of liquids and SLP did advise their use during prior MBS.  SLP advised pt to try chin tuck without straws.  Pt does state chin tuck posture is decreasing her cough with liquids.     Reviewed esophagram with pt and advised precautions to mitigate her known esophageal dysphagia.    Voice remains hoarse since April 2019 and pt expressed frustration with this stating this has not improved impairing her communication.  SLP recommends OP ENT referral given medical diagnosis and ongoing hoarseness x 9 months.  If pt is cleared for SLP voice treatment *after seeing ENT* would recommend.  SLP provided pt with information on voice amplifiers that she may find helpful to purchase to maximize her expressive communication.      HPI HPI: 78 yo patient referred for MBS due to pt having dysphagia.  Pt admitted with cachexia, stool burden, breast and scalp mass - likely metastatic breast cancer.  She underwent clinical evaluation of swallow yesterday and was placed on honey thick liquids and regular foods.  MBS indicated.  Reports voice became hoarse last April - denies significant issues with reflux.  Pt underwent  an esophagram that showed mild decreased stripping wave and distal narrowing.  Pt has been on a regular/thin diet with chin tuck posture due to her aspiration of thin from decreased timing of laryngeal closure.  SLP follow up to determine tolerance and reinforce helpful compensation strategies.        SLP Plan  Continue with current plan of care       Recommendations  Diet recommendations: Regular;Thin liquid Liquids provided via: Cup(if can conduct chin tuck posture adequately) Medication Administration: Crushed with puree(if large) Supervision: Patient able to self feed Compensations: Slow rate;Small sips/bites;Minimize environmental distractions;Chin tuck(start intake with liquids) Postural Changes and/or Swallow Maneuvers: Seated upright 90 degrees;Upright 30-60 min after meal                Oral Care Recommendations: Oral care BID SLP Visit Diagnosis: Dysphagia, oropharyngeal phase (R13.12) Plan: Continue with current plan of care       GO                Macario Golds 04/19/2018, 1:43 PM   Luanna Salk, [facility] SLP Acute Rehab Services Pager [phone] Office [phone]

## 2018-04-19 NOTE — Care Management Important Message (Signed)
Important Message  Patient Details  Name: Morgan Morrow MRN: [id number] Date of Birth: [birth year]   Medicare Important Message Given:  Yes    Kerin Salen 04/19/2018, 11:02 AMImportant Message  Patient Details  Name: Morgan Morrow MRN: [id number] Date of Birth: 1940/12/11   Medicare Important Message Given:  Yes    Kerin Salen 04/19/2018, 11:02 AM

## 2018-04-19 NOTE — Telephone Encounter (Signed)
Scheduled appt per 1/15 sch message - pt is aware of appt date and time

## 2018-04-19 NOTE — Progress Notes (Addendum)
Patient and family chose bed at [facility].   Patient will need updated PT note for insurance auth. LCSW notified attending.   Facility will start auth once updated PT note is available.   Patient can transport when Morgan Morrow is received.   LCSW will continue to follow.   [name]

## 2018-04-19 NOTE — Progress Notes (Signed)
Occupational Therapy Treatment Patient Details Name: Morgan Morrow MRN: [id number] DOB: [birth year] Today's Date: 04/19/2018    History of present illness 78 yo female adm to [facility] on 04/14/11 with wt loss, diarrhea, health declining over last few years per family; breast mass with numerous subcutaneous and other lesions, probably metastatic breast cancer, to go for bx today;   OT comments  Pt with very flat affect this day.  No family present.    Follow Up Recommendations  SNF    Equipment Recommendations  None recommended by OT    Recommendations for Other Services      Precautions / Restrictions Precautions Precautions: Fall Restrictions Weight Bearing Restrictions: No       Mobility Bed Mobility               General bed mobility comments: oob in recliner  Transfers Overall transfer level: Needs assistance Equipment used: Rolling walker (2 wheeled) Transfers: Sit to/from [facility] Sit to Stand: Min assist Stand pivot transfers: Mod assist       General transfer comment: Assist to rise, stabilize, control descent. VCs safety, hand placement    Balance Overall balance assessment: Needs assistance         Standing balance support: Bilateral upper extremity supported Standing balance-Leahy Scale: Poor                             ADL either performed or assessed with clinical judgement   ADL Overall ADL's : Needs assistance/impaired                     Lower Body Dressing: Moderate assistance;Sit to/from stand;Cueing for sequencing;Cueing for safety   Toilet Transfer: RW;BSC;Stand-pivot;Moderate assistance   Toileting- Clothing Manipulation and Hygiene: Minimal assistance;Sit to/from stand         General ADL Comments: pt needed increased A this day with transfer to toilet     Vision Baseline Vision/History: No visual deficits            Cognition Arousal/Alertness: Awake/alert Behavior During Therapy:  WFL for tasks assessed/performed Overall Cognitive Status: Within Functional Limits for tasks assessed                                                     Pertinent Vitals/ Pain       Pain Assessment: No/denies pain  Home Living Family/patient expects to be discharged to:: Skilled nursing facility                                            Frequency  Min 2X/week        Progress Toward Goals  OT Goals(current goals can now be found in the care plan section)  Progress towards OT goals: Progressing toward goals     Plan Discharge plan remains appropriate       AM-PAC OT "6 Clicks" Daily Activity     Outcome Measure   Help from another person eating meals?: A Little Help from another person taking care of personal grooming?: A Little Help from another person toileting, which includes using toliet, bedpan, or urinal?: A Lot Help from another person bathing (including  washing, rinsing, drying)?: A Lot Help from another person to put on and taking off regular upper body clothing?: A Little Help from another person to put on and taking off regular lower body clothing?: A Lot 6 Click Score: 15    End of Session Equipment Utilized During Treatment: Gait belt;Rolling walker  OT Visit Diagnosis: Unsteadiness on feet (R26.81);Muscle weakness (generalized) (M62.81)   Activity Tolerance Patient tolerated treatment well   Patient Left in chair;with call bell/phone within reach;with chair alarm set   Nurse Communication Mobility status        Time: 3009-2330 OT Time Calculation (min): 15 min  Charges: OT General Charges $OT Visit: 1 Visit OT Treatments $Self Care/Home Management : 8-22 mins  Kari Baars, [place] [phone] Office- [phone]      Yariel Ferraris, Edwena Felty D 04/19/2018, 2:16 PM

## 2018-04-20 LAB — COMPREHENSIVE METABOLIC PANEL
ALT: 23 U/L (ref 0–44)
AST: 53 U/L — AB (ref 15–41)
Albumin: 2.1 g/dL — ABNORMAL LOW (ref 3.5–5.0)
Alkaline Phosphatase: 275 U/L — ABNORMAL HIGH (ref 38–126)
Anion gap: 9 (ref 5–15)
BUN: 21 mg/dL (ref 8–23)
CO2: 25 mmol/L (ref 22–32)
CREATININE: 0.75 mg/dL (ref 0.44–1.00)
Calcium: 8.2 mg/dL — ABNORMAL LOW (ref 8.9–10.3)
Chloride: 103 mmol/L (ref 98–111)
GFR calc Af Amer: >60 mL/min (ref >60)
GFR calc non Af Amer: >60 mL/min (ref >60)
Glucose, Bld: 82 mg/dL (ref 70–99)
Potassium: 3.5 mmol/L (ref 3.5–5.1)
Sodium: 137 mmol/L (ref 135–145)
Total Bilirubin: 0.9 mg/dL (ref 0.3–1.2)
Total Protein: 4.9 g/dL — ABNORMAL LOW (ref 6.5–8.1)

## 2018-04-20 LAB — CBC
HCT: 28.7 % — ABNORMAL LOW (ref 36.0–46.0)
Hemoglobin: 9 g/dL — ABNORMAL LOW (ref 12.0–15.0)
MCH: 30.3 pg (ref 26.0–34.0)
MCHC: 31.4 g/dL (ref 30.0–36.0)
MCV: 96.6 fL (ref 80.0–100.0)
NRBC: 0 % (ref 0.0–0.2)
Platelets: 195 10*3/uL (ref 150–400)
RBC: 2.97 MIL/uL — ABNORMAL LOW (ref 3.87–5.11)
RDW: 13.5 % (ref 11.5–15.5)
WBC: 6.6 10*3/uL (ref 4.0–10.5)

## 2018-04-20 LAB — MAGNESIUM: Magnesium: 1.9 mg/dL (ref 1.7–2.4)

## 2018-04-20 MED ORDER — POLYETHYLENE GLYCOL 3350 17 G PO PACK: 17.0000 g | PACK | Freq: Two times a day (BID) | ORAL | 0 refills | Status: DC

## 2018-04-20 MED ORDER — GERHARDT'S BUTT CREAM
TOPICAL_CREAM | Freq: Two times a day (BID) | CUTANEOUS | Status: DC
  Administered 2018-04-20: 11:00:00 via TOPICAL
  Filled 2018-04-20: qty 1

## 2018-04-20 MED ORDER — PANTOPRAZOLE SODIUM 40 MG PO TBEC: 40.0000 mg | DELAYED_RELEASE_TABLET | Freq: Every day | ORAL | 1 refills | Status: AC

## 2018-04-20 MED ORDER — POTASSIUM CHLORIDE CRYS ER 20 MEQ PO TBCR: 40.0000 meq | EXTENDED_RELEASE_TABLET | Freq: Once | ORAL | Status: DC

## 2018-04-20 MED ORDER — ANASTROZOLE 1 MG PO TABS: 1.0000 mg | ORAL_TABLET | Freq: Every day | ORAL | 0 refills | Status: AC

## 2018-04-20 MED ORDER — SIMETHICONE 80 MG PO CHEW: 160.0000 mg | CHEWABLE_TABLET | Freq: Four times a day (QID) | ORAL | 0 refills | Status: DC

## 2018-04-20 MED ORDER — PANTOPRAZOLE SODIUM 40 MG PO TBEC: 40.0000 mg | DELAYED_RELEASE_TABLET | Freq: Every day | ORAL | 0 refills | Status: DC

## 2018-04-20 MED ORDER — ENSURE ENLIVE PO LIQD: 237.0000 mL | Freq: Two times a day (BID) | ORAL | 12 refills | Status: AC

## 2018-04-20 NOTE — Clinical Social Work Placement (Signed)
    Patient and family chose bed at [facility]. Room 106B  LCSW confirmed bed and auth with facility.   LCSW faxed dc docs to facility.   Patient to transport by [facility]AR.   RN report number: [phone]  [name]  NOTE  Date:  04/20/2018  Patient Details  Name: Morgan Morrow MRN: [id number] Date of Birth: [birth year]  Clinical Social Work is seeking post-discharge placement for this patient at the [place] level of care (*CSW will initial, date and re-position this form in  chart as items are completed):  Yes   Patient/family provided with [place] Work Department's list of facilities offering this level of care within the geographic area requested by the patient (or if unable, by the patient's family).  Yes   Patient/family informed of their freedom to choose among providers that offer the needed level of care, that participate in Medicare, Medicaid or managed care program needed by the patient, have an available bed and are willing to accept the patient.  Yes   Patient/family informed of [facility]'s ownership interest in [facility] and [facility], as well as of the fact that they are under no obligation to receive care at these facilities.  PASRR submitted to EDS on       PASRR number received on 04/17/18     Existing PASRR number confirmed on       FL2 transmitted to all facilities in geographic area requested by pt/family on 04/17/18     FL2 transmitted to all facilities within larger geographic area on       Patient informed that his/her managed care company has contracts with or will negotiate with certain facilities, including the following:        Yes   Patient/family informed of bed offers received.  Patient chooses bed at [facility])     Physician recommends and patient chooses bed at      Patient to be transferred to [facility]) on 04/20/18.  Patient to be transferred  to facility by EMS     Patient family notified on 04/20/18 of transfer.  Name of family member notified:  [name]     PHYSICIAN Please prepare priority discharge summary, including medications     Additional Comment:    _______________________________________________ Servando Snare, LCSW 04/20/2018, 10:35 AM

## 2018-04-20 NOTE — Progress Notes (Signed)
PIV consult canceled per Jinny Blossom, RN. No longer needed.

## 2018-04-20 NOTE — Progress Notes (Signed)
Called and gave report to [place] at [facility] all questions answered and number left in case of any clarifications needed

## 2018-04-20 NOTE — Care Management Note (Signed)
Case Management Note  Patient Details  Name: Morgan Morrow MRN: [id number] Date of Birth: Jan 31, 1941  Subjective/Objective:                  discharged  Action/Plan: Has appointment for pcp and oncology  Expected Discharge Date:  04/20/18               Expected Discharge Plan:     In-House Referral:     Discharge planning Services     Post Acute Care Choice:    Choice offered to:     DME Arranged:    DME Agency:     HH Arranged:    [place] Agency:     Status of Service:     If discussed at [employer] of Stay Meetings, dates discussed:    Additional Comments:  Leeroy Cha, RN 04/20/2018, 1:53 PM

## 2018-04-20 NOTE — Discharge Summary (Signed)
Physician Discharge Summary  DENI BERTI HBZ:[id number] DOB: 07-11-1940 DOA: 04/13/2018  PCP: Leighton Ruff, MD  Admit date: 04/13/2018 Discharge date: 04/20/2018  Time spent: 40 minutes  Recommendations for Outpatient Follow-up:  1. Follow up outpatient CBC/CMP 2. Follow up with oncology as an outpatient.  Appt 04/27/2018 at 12:30 PM. 3. Follow up constipation.  She'll be discharged on miralax BID.  Would continue this with her continued symptoms (bloating, mild distension).  Would titrate miralax based on her symptoms.  Would have goal of at least 1 normal soft bowel movement daily prior to decreasing miralax.  Will need close follow up for this.    4. Consider outpatient ENT follow up for hoarseness 5. Follow speech recs, meds crushed with puree (etc) as noted below 6. Holding BP meds at discharge, consider resuming based on BP as outpatient   Discharge Diagnoses:  Principal Problem:   Metastatic breast cancer ([place]) Active Problems:   Cachexia ([place])   Hematochezia   Community acquired pneumonia   Constipation, chronic   Osteolytic lesion   FTT (failure to thrive) in adult   Anemia   Murmur, cardiac   Diarrhea   Fecal impaction in rectum ([facility])   Hypokalemia   Breast mass, probable cancer   Scalp mass, probable breast cancer metastasis   Stercoral proctocolitis of rectum   Malnutrition of moderate degree   Discharge Condition: stable  Diet recommendation: per speech (see below)  Filed Weights   04/13/18 1638  Weight: 45.4 kg    History of present illness:  Per Dr Si Raider Chauncey Reading Wagneris Kelina Beauchamp 78 y.o.femalewith medical history significant forhtn, who presented with weight loss and diarrhea.   History obtained from patient and her daughter.  Daughter reported decline in health over the past year, getting more rapid. Significant weight loss, difficulty swallowing, hoarseness of voice, difficulty with mobility, and increased confusion. Has Tajia Szeliga PCP, was last seen in  approximately November, patient and family unaware of results of that work-up.  Presented to ED mainly because daughter says patient finally relented, which she explains to mean she has been concerned about her mother's health for some time. The only acute change is about 2 weeks of diarrhea, no blood or mucous. No fevers, no recent travel or antibiotics. No abdominal pain. Denies cough or shortness of breath. Denies fevers or recent falls.  ED Course:fecal disempaction, labs, antibiotics, fluids, ct, CXR  Hospital Summary She was admitted for general failure to thrive and was found to have imaging concerning for metastatic cancer and fecal impaction with concern for stercoral colitis.  Surgery was consulted and took bx of R breast and scalp which were notable for carcinoma.  Oncology c/s and started pt on anastrazole and planning for outpatient follow up.  She was seen by GI due to dysphagia and had barium swallow, but pt declined EGD.   See below for additional details  Hospital Course:  1 Suspected metastatic breast cancer  FTT Patient notes to have right breast cancer, weight loss, increasing disability.  CT chest with concern for ill defined pulm nodules concerning for metastatic disease, bony metastatic involvement, hypodense liver lesions concerning for metastatic involvement  CT abdomen pelvis with ill define pulm opacities at the lung base concerning for possible metastatic disease, extensive osteolytic abnormality of axial and appendicular skeleton (concerning for metastassi of unknown primary or myeloma), 3 hypodense lesions of liver, and concern for stercoral proctocolitis Oncology following, appreciate recs  Elevated CA 27.29 Bx from 1/13 R breast and top  of scalp c/w carcinoma (core bx of breast and 2 scalp lesions by surgery on 1/13) ER/PR positive and HER2 negative Started anastrozole per oncology, planning to add palbociclib at follow up visit (1/24 at 12:30 PM)   2.  Dysphagia Concern for dysphagia.  Not clear whether liquids/solids CT abdomen and pelvis with esophageal thickening. Patient with significant weight loss, debility, hoarseness of voice.  GI c/s, appreciate recs Barium swallow notable for mild smooth narrowing of distal esophagus above small hiatal hernia.  Small circumferential esophageal stricture.  Slightly lobultated appearance along L lateral aspect above stricture.  Cause of stricture indeterminate. Per GI note on 1/13, pt/family declined EGD (if bx not conclusive, planning to reconsider EGD) Continue PPI Appreciate speech recs - recommending considering outpatient ENT referral for hoarseness and her metastatic cancer - regular, thin liquid (see below)  Diet recommendations: Regular;Thin liquid Liquids provided via: Cup(if can conduct chin tuck posture adequately) Medication Administration: Crushed with puree(if large) Supervision: Patient able to self feed Compensations: Slow rate;Small sips/bites;Minimize environmental distractions;Chin tuck(start intake with liquids) Postural Changes and/or Swallow Maneuvers: Seated upright 90 degrees;Upright 30-60 min after meal      Oral Care Recommendations: Oral care BID SLP Visit Diagnosis: Dysphagia, oropharyngeal phase (R13.12) Plan: Continue with current plan of care         Oxygen Requirement: resolved, weaned to room air  Anemia: labs suggestive of AOCD.  Normal B12 and folate.  Positive FOBT.  Seen by GI.  Thought heme positive stool related to fecal impaction.  Recommended aggressive laxatives.  See prior notes.  CTM, stable.  # Abdominal Discomfort: better after belching, has simethicone ordered, continue to monitor.  Still with some mild distension.  I think this is related to below.  Will continue miralax.  3. Fecal impaction  constipation/stercoral ulcer of the anus  Diarrhea On discussion with daughter, still having diarrhea (which was thought to be overflow  incontinence around stool ball), but per nurse report, sounds like this is more formed than when she previously came in (after discussion with daughter).   Will discharge her on miralax twice daily.  Important to continue bowel regimen for her in setting of the sterocoral proctocolitis.  Would recommend BID miralax to ensure that she's having at least 1 formed normal bowel movement daily.  Can start to titrate this if she improves. Continue to monitor  5. Murmur Noted on examination. Patient states she has had Isrrael Fluckiger murmur for Johaan Ryser long time. 2D echo with EF 55-60%, mild AV stenosis. No further work-up needed at this time. Outpatient follow-up.  7. Hypokalemia Resolved  8. Elevated lipase/ Patient denies any epigastric abdominal pain. Tolerating diet. Follow.  9. ??? Community-acquired pneumonia Chest x-ray was suggestive of Leilyn Frayre pneumonia. CT findings of abdomen and pelvis most suggestive of metastatic process ongoing. Patient noted to be hypothermic and hypotensive on admission. Patient pancultured. CT chest done was negative for middle lobe pneumonia and findings noted on chest x-ray was felt to be secondary to breast cancer. IV antibiotics have beendiscontinued. Follow.   10. Hypothermia Likely secondary to metastatic cancer malnutrition. Patient also noted to have concerns for community-acquired pneumonia on admission. Off [employer]. Hypothermia seems to have resolved. CT chest negative for pneumonia. IV antibiotics have been discontinued. Follow.  Procedures: Core biopsies of breast and 2 scalp lesions on 1/13 Echo Study Conclusions  - Left ventricle: The cavity size was normal. Wall thickness was normal. Systolic function was normal. The estimated ejection fraction was in the range  of 55% to 60%. Wall motion was normal; there were no regional wall motion abnormalities. Doppler parameters are consistent with abnormal left ventricular relaxation (grade  1 diastolic dysfunction). - Aortic valve: There was mild stenosis. There was trivial regurgitation. Mean gradient (S): 15 mm Hg. - Mitral valve: Moderately calcified annulus. There was mild to moderate regurgitation. - Left atrium: The atrium was normal in size. - Right ventricle: The cavity size was normal. Wall thickness was normal. Systolic function was normal. RV systolic pressure (S, est): 29 mm Hg. - Right atrium: The atrium was normal in size. - Tricuspid valve: There was trivial regurgitation. - Inferior vena cava: The vessel was normal in size. The respirophasic diameter changes were in the normal range (>= 50%), consistent with normal central venous pressure. - Pericardium, extracardiac: There was no pericardial effusion.  (i.e. Studies not automatically included, echos, thoracentesis, etc; not x-rays)  Consultations:  Oncology  Gastroenterology  Surgery  Discharge Exam: Vitals:   04/20/18 0911 04/20/18 0912  BP: 127/75   Pulse: 88   Resp: (!) 25   Temp: (!) 97.5 F (36.4 C)   SpO2: 95% 95%   Feels ok.  Worn out.  Still some gas, bloating.  Had BM this morning.  General: No acute distress. Cardiovascular: Heart sounds show Jarmarcus Wambold regular rate, and rhythm Lungs: Clear to auscultation bilaterally  Abdomen: Soft, nontender, mildly distended Neurological: Alert and oriented 3. Moves all extremities 4. Cranial nerves II through XII grossly intact. Skin: Warm and dry. No rashes or lesions. Extremities: No clubbing or cyanosis. No edema.  Psychiatric: Mood and affect are normal. Insight and judgment are appropriate.  Discharge Instructions   Discharge Instructions    Call MD for:  difficulty breathing, headache or visual disturbances   Complete by:  As directed    Call MD for:  extreme fatigue   Complete by:  As directed    Call MD for:  hives   Complete by:  As directed    Call MD for:  persistant dizziness or light-headedness   Complete by:  As  directed    Call MD for:  persistant nausea and vomiting   Complete by:  As directed    Call MD for:  redness, tenderness, or signs of infection (pain, swelling, redness, odor or green/yellow discharge around incision site)   Complete by:  As directed    Call MD for:  severe uncontrolled pain   Complete by:  As directed    Call MD for:  temperature >100.4   Complete by:  As directed    Discharge diet:   Complete by:  As directed    Diet recommendations: Regular;Thin liquid Liquids provided via: Cup(if can conduct chin tuck posture adequately) Medication Administration: Crushed with puree(if large) Supervision: Patient able to self feed Compensations: Slow rate;Small sips/bites;Minimize environmental distractions;Chin tuck(start intake with liquids) Postural Changes and/or Swallow Maneuvers: Seated upright 90 degrees;Upright 30-60 min after meal    Oral Care Recommendations: Oral care BID SLP Visit Diagnosis: Dysphagia, oropharyngeal phase (R13.12) Plan: Continue with current plan of care   Discharge instructions   Complete by:  As directed    You were found to have metastatic breast cancer.  You've been started on anastrazole and will follow up with oncology on 1/24 for further management.  Please ensure you follow up with oncology on 1/24 as scheduled.  You had imaging findings concerning for severe constipation.  Some of the diarrhea you initially had was due to this severe constipation.  It's important that you have at least one soft normal bowel movement daily going forward.  We'll continue you on miralax at discharge, this dose can be adjusted based on your symptoms.  I've stopped your blood pressure medicines because your blood pressures were reasonable here off of them.  Follow this up as an outpatient.  Follow the dietary changes recommended by speech.  You can consider outpatient ENT follow up with your hoarse voice.  Return for new, recurrent, or worsening  symptoms.  Please ask your PCP to request records from this hospitalization so they know what was done and what the next steps will be.   Increase activity slowly   Complete by:  As directed      Allergies as of 04/20/2018   No Known Allergies     Medication List    STOP taking these medications   amLODipine 10 MG tablet Commonly known as:  NORVASC   lisinopril 20 MG tablet Commonly known as:  PRINIVIL,ZESTRIL     TAKE these medications   acetaminophen 500 MG tablet Commonly known as:  TYLENOL Take 500 mg by mouth every 6 (six) hours as needed for mild pain.   anastrozole 1 MG tablet Commonly known as:  ARIMIDEX Take 1 tablet (1 mg total) by mouth daily for 30 days. Start taking on:  April 21, 2018   feeding supplement (ENSURE ENLIVE) Liqd Take 237 mLs by mouth 2 (two) times daily between meals.   ibuprofen 200 MG tablet Commonly known as:  ADVIL,MOTRIN Take 200-400 mg by mouth every 6 (six) hours as needed for mild pain.   pantoprazole 40 MG tablet Commonly known as:  PROTONIX Take 1 tablet (40 mg total) by mouth daily.   polyethylene glycol packet Commonly known as:  MIRALAX / GLYCOLAX Take 17 g by mouth 2 (two) times daily for 30 days.   simethicone 80 MG chewable tablet Commonly known as:  MYLICON Chew 2 tablets (160 mg total) by mouth 4 (four) times daily.      No Known Allergies Follow-up Information    Leighton Ruff, MD .   Specialty:  [facility] Medicine Contact information: [place] [address] [phone]        [name], [name], MD Follow up.   Specialty:  Oncology Why:  04/27/2018 at 12:30 PM Contact information: [place] [address] [phone]            The results of significant diagnostics from this hospitalization (including imaging, microbiology, ancillary and laboratory) are listed below for reference.    Significant Diagnostic Studies: Dg Chest 2 View  Result Date:  04/13/2018 CLINICAL DATA:  Per EMS, patient from home, c/o diarrhea worsening x2 days after taking Brunetta Newingham laxative. Denies N/V and abdominal pain. Weakness, short of breath, never Shaheim Mahar smoker, no other chest complaints EXAM: CHEST - 2 VIEW COMPARISON:  11/02/2010 FINDINGS: Patchy airspace opacities in the right middle lobe, new since previous. Left lung clear. Heart size and mediastinal contours are within normal limits. No effusion. Visualized bones unremarkable. IMPRESSION: Patchy right middle lobe airspace disease suggesting pneumonia. Electronically Signed   By: Lucrezia Europe M.D.   On: 04/13/2018 17:33   Ct Head W & Wo Contrast  Result Date: 04/14/2018 CLINICAL DATA:  Progressive weakness and weight loss for several months. Diarrhea. Liver and bone metastases. Unknown primary. Bilateral pulmonary nodules. EXAM: CT HEAD WITHOUT AND WITH CONTRAST TECHNIQUE: Contiguous axial images were obtained from the base of the skull  through the vertex without and with intravenous contrast CONTRAST:  84m OMNIPAQUE IOHEXOL 300 MG/ML  SOLN COMPARISON:  None. FINDINGS: Brain: Mild atrophy and moderate diffuse white matter disease is present. There is no pathologic enhancement to suggest metastatic disease to the brain. The ventricles are of proportionate to the degree of atrophy. Remote lacunar infarcts are present in the basal ganglia bilaterally. Brainstem and cerebellum are normal. Vascular: Atherosclerotic calcifications are present within the cavernous internal carotid arteries and at the dural margin the left vertebral artery. There is no hyperdense vessel. Skull: Scattered lytic lesions present throughout the calvarium compatible with known bone metastases. No expansile lesions are present. An indeterminate scalp soft tissue lesion over the left parietal skull near the vertex measures 2.5 x 2.4 x 0.6 cm. There is diffuse infiltration of the calvarium subjacent to this area. Sinuses/Orbits: The paranasal sinuses and mastoid air  cells are clear. The globes and orbits are within normal limits. IMPRESSION: 1. No evidence for metastatic disease to brain. 2. Extensive osseous metastases throughout the skull. 3. Focal soft tissue in the high left parietal scalp. This could represent Lydon Vansickle metastatic deposit. Electronically Signed   By: CSan MorelleM.D.   On: 04/14/2018 12:45   Ct Chest W Contrast  Result Date: 04/14/2018 CLINICAL DATA:  Right middle lobe pneumonia. EXAM: CT CHEST WITH CONTRAST TECHNIQUE: Multidetector CT imaging of the chest was performed during intravenous contrast administration. CONTRAST:  771mOMNIPAQUE IOHEXOL 300 MG/ML  SOLN COMPARISON:  Chest x-ray 04/13/2018 FINDINGS: Cardiovascular: Heart size upper normal. No substantial pericardial effusion. Coronary artery calcification is evident. Atherosclerotic calcification is noted in the wall of the thoracic aorta. Ascending thoracic aorta measures 3.8 cm diameter. Mediastinum/Nodes: Soft tissue attenuation is identified in the AP window without Queenie Aufiero discrete lymph node. Small prevascular lymph nodes are so seated with small lymph nodes in each hilar region. Circumferential wall thickening is noted in the distal esophagus and fluid within the esophageal lumen is compatible with reflux or dysmotility. There is no axillary lymphadenopathy. Lungs/Pleura: The central tracheobronchial airways are patent. Numerous ill-defined pulmonary nodules are scattered through both lungs. Index nodule medial right upper lobe (45/11) measures 5 mm. Index right lower lobe nodule seen posteriorly on 102/11 measures 8 mm. Representative peripheral left lower lobe nodule (83/11) measures 6 mm. Small bilateral pleural effusions evident. Upper Abdomen: 11 mm low-density lesion in the liver, better characterized on yesterday's abdomen CT. Additional small low-density lesions in the liver. Calcified gallstones evident. Small cyst noted right kidney. Musculoskeletal: Widespread bony metastatic  involvement is evident. 7.7 x 6.8 x 2.0 cm mass identified in the right breast. IMPRESSION: 1. No evidence for right middle lobe pneumonia. Opacity seen over the right breast on recent chest x-ray likely represents the patient's known large right breast mass. 2. Numerous ill-defined pulmonary nodules scattered through both lungs, highly suspicious for metastatic disease. 3. Widespread bony metastatic involvement. 4. Hypodense liver lesions concerning for metastatic involvement. 5. Small bilateral pleural effusions. 6.  Aortic Atherosclerois (ICD10-170.0) Electronically Signed   By: ErMisty Stanley.D.   On: 04/14/2018 13:52   Ct Abdomen Pelvis W Contrast  Result Date: 04/13/2018 CLINICAL DATA:  Worsening diarrhea x2 days after taking laxatives. EXAM: CT ABDOMEN AND PELVIS WITH CONTRAST TECHNIQUE: Multidetector CT imaging of the abdomen and pelvis was performed using the standard protocol following bolus administration of intravenous contrast. CONTRAST:  8059mSOVUE-300 IOPAMIDOL (ISOVUE-300) INJECTION 61%, 75m4mNIPAQUE IOHEXOL 300 MG/ML SOLN COMPARISON:  None. FINDINGS: Lower chest: Small hiatal  hernia. Mild thickening of what may represent the distal esophagus raises possibility of esophagitis versus partial volume averaging of the uppermost aspect of the hiatal hernia. Top normal heart size without pericardial effusion. Subpleural ill-defined pulmonary opacities that may represent postinfectious or inflammatory change versus atelectasis are identified. Some however appear more nodular raising concern for pulmonary nodules, example Shiya Fogelman 3 mm right lower lobe nodule on series 7/13 with smaller nodular densities seen in the right lower lobe. Given abnormality of the axial and appendicular skeleton concerning for diffuse osseous metastasis or myeloma, these nodular opacities raise concern for pulmonary metastasis as well. Hepatobiliary: Ill-defined hypodensities in the right hepatic lobe are identified, the largest  measuring up to 14 mm with peripheral puddling of contrast suggestive of Selah Klang hemangioma is noted. Additional 11 mm hypodensity in the right hepatic lobe also appears to demonstrate similar enhancement pattern on repeat delayed imaging and would be more in keeping with Harvir Patry hemangioma. Further caudad in the right hepatic lobe is an ill-defined 12 mm hypodensity that appears to have filled in on repeat delayed imaging. Given pulmonary and osseous findings however, the possibility of hepatic metastasis is not excluded. MRI with liver protocol may help for further correlation. No biliary dilatation. Numerous gallstones are seen within the gallbladder. Pancreas: Normal Spleen: Normal size spleen with ill-defined hypodensity medially measuring 12 mm, nonspecific in etiology. Adrenals/Urinary Tract: Normal bilateral adrenal glands and kidneys. No obstructive uropathy. Urinary bladder is physiologically distended without focal mural thickening or calculus. Stomach/Bowel: Large stool ball in the rectum with circumferential anorectal thickening about this finding raises concern for stercoral proctocolitis. No bowel obstruction. The stomach and small intestine are nonacute. No evidence of acute appendicitis. Vascular/Lymphatic: Mild aortoiliac and branch vessel atherosclerosis without aneurysm. No adenopathy. Reproductive: Uterus and bilateral adnexa are unremarkable. Other: Mild soft tissue anasarca.  No ascites or free air. 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 Differential possibilities may include myeloma or diffuse osteo lytic metastasis. Moderate compression deformity of L1 with 50% height loss is noted as well as mild superior endplate compression of T11. No retropulsion is noted. IMPRESSION: 1. Ill-defined pulmonary opacities at the lung bases some which are nodular in appearance raising concern for possible metastatic disease. Concomitant in postinfectious or  inflammatory abnormalities are not excluded. 2. 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 Differential possibilities may include osteolytic metastasis of unknown primary or myeloma among some considerations. 3. Three hypodense lesions of the liver that appear to demonstrate peripheral puddling of contrast more likely to represent hemangiomata. However given the pulmonary and osseous findings, metastatic disease is still within differential considerations. 4. Anal rectal circumferential mural thickening surrounding Mac Dowdell large stool ball. Stercoral proctocolitis is raised. These results were called by telephone at the time of interpretation on 04/13/2018 at 7:21 pm to Dr. Francine Graven , who verbally acknowledged these results. Electronically Signed   By: Ashley Royalty M.D.   On: 04/13/2018 19:21   Dg Swallowing Func-speech Pathology  Result Date: 04/15/2018 Objective Swallowing Evaluation: Type of Study: MBS-Modified Barium Swallow Study  Patient Details Name: MAHREEN SCHEWE MRN: [id number] Date of Birth: 04-09-1940 Today's Date: 04/15/2018 Time: SLP Start Time (ACUTE ONLY): 6195 -SLP Stop Time (ACUTE ONLY): 1350 SLP Time Calculation (min) (ACUTE ONLY): 15 min Past Medical History: Past Medical History: Diagnosis Date . Allergy  . Hypertension  Past Surgical History: No past surgical history on file.  HPI: 78 yo patient referred for MBS due to pt having dysphagia.  Pt admitted with cachexia, stool burden, breast and scalp mass - likely metastatic breast cancer.  She underwent clinical evaluation of swallow yesterday and was placed on honey thick liquids and regular foods.  MBS indicated.  Reports voice became hoarse last April - denies significant issues with reflux.   Subjective: The patient was seen sitting upright in chair Assessment / Plan / Recommendation CHL IP CLINICAL IMPRESSIONS 04/15/2018  Clinical Impression Patient presents with mild pharyngeal dysphagia with decreased timing and adequacy of laryngeal elevation/closure causing minimal aspiration of thin with head neutral.  Aspiration was silent when trace but audible with cough response when mild. Chin tuck posture with pt fully upright and small boluses, airway protection intact.   Pharyngeal swallow is strong without significant residuals with thin, nectar, cracker - Limited amount of barium pt was provided due to pt's bowel issue.  Recommend advance diet to regular/thin with chin tuck posture.  Educated pt to aspiration precautions and mitigation strategies using teach back.   SLP Visit Diagnosis Dysphagia, pharyngeal phase (R13.13) Attention and concentration deficit following -- Frontal lobe and executive function deficit following -- Impact on safety and function Mild aspiration risk   CHL IP TREATMENT RECOMMENDATION 04/15/2018 Treatment Recommendations Therapy as outlined in treatment plan below   Prognosis 04/15/2018 Prognosis for Safe Diet Advancement Fair Barriers to Reach Goals Other (Comment) Barriers/Prognosis Comment -- CHL IP DIET RECOMMENDATION 04/15/2018 SLP Diet Recommendations Regular solids;Thin liquid Liquid Administration via Straw Medication Administration Whole meds with puree Compensations Slow rate;Small sips/bites;Chin tuck Postural Changes Remain semi-upright after after feeds/meals (Comment);Seated upright at 90 degrees   CHL IP OTHER RECOMMENDATIONS 04/15/2018 Recommended Consults Other (Comment) Oral Care Recommendations Oral care QID Other Recommendations --   CHL IP FOLLOW UP RECOMMENDATIONS 04/14/2018 Follow up Recommendations Other (comment)   CHL IP FREQUENCY AND DURATION 04/15/2018 Speech Therapy Frequency (ACUTE ONLY) min 2x/week Treatment Duration 2 weeks      CHL IP ORAL PHASE 04/15/2018 Oral Phase Impaired Oral - Pudding Teaspoon -- Oral - Pudding Cup -- Oral - Honey Teaspoon -- Oral - Honey Cup -- Oral - Nectar  Teaspoon -- Oral - Nectar Cup WFL Oral - Nectar Straw -- Oral - Thin Teaspoon WFL Oral - Thin Cup WFL Oral - Thin Straw WFL Oral - Puree NT Oral - Mech Soft -- Oral - Regular WFL Oral - Multi-Consistency -- Oral - Pill -- Oral Phase - Comment --  CHL IP PHARYNGEAL PHASE 04/15/2018 Pharyngeal Phase Impaired Pharyngeal- Pudding Teaspoon -- Pharyngeal -- Pharyngeal- Pudding Cup -- Pharyngeal -- Pharyngeal- Honey Teaspoon -- Pharyngeal -- Pharyngeal- Honey Cup -- Pharyngeal -- Pharyngeal- Nectar Teaspoon -- Pharyngeal -- Pharyngeal- Nectar Cup Penetration/Aspiration during swallow;WFL Pharyngeal -- Pharyngeal- Nectar Straw -- Pharyngeal -- Pharyngeal- Thin Teaspoon WFL Pharyngeal -- Pharyngeal- Thin Cup Reduced airway/laryngeal closure;Penetration/Aspiration during swallow Pharyngeal Material enters airway, passes BELOW cords without attempt by patient to eject out (silent aspiration) Pharyngeal- Thin Straw Penetration/Apiration after swallow Pharyngeal Material enters airway, passes BELOW cords without attempt by patient to eject out (silent aspiration);Material enters airway, passes BELOW cords and not ejected out despite cough attempt by patient Pharyngeal- Puree NT Pharyngeal -- Pharyngeal- Mechanical Soft -- Pharyngeal -- Pharyngeal- Regular WFL Pharyngeal -- Pharyngeal- Multi-consistency -- Pharyngeal -- Pharyngeal- Pill -- Pharyngeal -- Pharyngeal Comment chin tuck helpful to prevent aspiration - trace penetration only which cleared  CHL IP CERVICAL ESOPHAGEAL PHASE 04/15/2018 Cervical Esophageal Phase WFL Pudding Teaspoon --  Pudding Cup -- Honey Teaspoon -- Honey Cup -- Nectar Teaspoon -- Nectar Cup -- Nectar Straw -- Thin Teaspoon -- Thin Cup -- Thin Straw -- Puree -- Mechanical Soft -- Regular -- Multi-consistency -- Pill -- Cervical Esophageal Comment -- Macario Golds 04/15/2018, 2:18 PM    Luanna Salk, MS [facility] SLP Acute Rehab Services Pager [phone] Office [place]  [facility] Chest & Delayed Img Double Cm  Result Date: 04/16/2018 CLINICAL DATA:  78 year old female with dysphagia nausea and vomiting. Metastatic breast cancer. Subsequent encounter. EXAM: ESOPHOGRAM/BARIUM SWALLOW TECHNIQUE: Single contrast examination was performed using  thin barium. FLUOROSCOPY TIME:  Fluoroscopy Time:  1 minutes and 6 seconds Radiation Exposure Index: 6.5 mGy COMPARISON:  04/15/2018 speech swallow.  04/13/2018 chest CT. FINDINGS: Exam was tailored to the patient. Single contrast exam performed with thin barium. No laryngeal aspiration occurred with chin tuck position. Slightly blunted primary esophageal stripping wave. No esophageal obstructing lesion. Mild smooth narrowing of the distal esophagus just above small hiatal hernia. 2 cm proximal to this level, there is Millette Halberstam small circumferential esophageal stricture. The esophagus just above this level has Ahmir Bracken slightly lobulated appearance left lateral aspect. Cause of strictures indeterminate. Retained secretions limited evaluation esophageal mucosa. Patient attempted but was not able to swallow barium tablet. IMPRESSION: 1. Exam was tailored to the patient. 2. No laryngeal aspiration occurred with chin tuck position. 3. Slightly blunted primary esophageal stripping wave. 4. No esophageal obstructing lesion. 5. Mild smooth narrowing of the distal esophagus just above small hiatal hernia. 2 cm proximal to this level, there is Toluwani Yadav small circumferential esophageal stricture. The esophagus just above this stricture has Jayana Kotula slightly lobulated appearance along left lateral aspect. Cause of stricture is indeterminate. 6. Retained secretions limited evaluation of esophageal mucosa. 7. Patient attempted but was not able to swallow barium tablet (poor oropharyngeal phase). Electronically Signed   By: Genia Del M.D.   On: 04/16/2018 10:14    Microbiology: Recent Results (from the past 240 hour(s))  Culture, blood (routine x 2)     Status: None   Collection  Time: 04/13/18 10:02 PM  Result Value Ref Range Status   Specimen Description BLOOD RIGHT WRIST  Final   Special Requests   Final    BOTTLES DRAWN AEROBIC AND ANAEROBIC Blood Culture adequate volume Performed at [facility] [address], [place], [place] [address]    Culture   Final    NO GROWTH 5 DAYS Performed at [facility], [place] [address] [place], [place] [address]    Report Status 04/19/2018 FINAL  Final  Culture, blood (routine x 2)     Status: None   Collection Time: 04/13/18 10:02 PM  Result Value Ref Range Status   Specimen Description   Final    BLOOD RIGHT HAND Performed at [facility], [place] [address] [place], [place] [address]    Special Requests   Final    BOTTLES DRAWN AEROBIC ONLY Blood Culture adequate volume Performed at [facility] [address], [place], [place] [address]    Culture   Final    NO GROWTH 5 DAYS Performed at [facility], [place] [address], [place],  [address]    Report Status 04/19/2018 FINAL  Final  MRSA PCR Screening     Status: None   Collection Time: 04/15/18 11:04 PM  Result Value Ref Range Status  MRSA by PCR NEGATIVE NEGATIVE Final    Comment:        The GeneXpert MRSA Assay (FDA approved for NASAL specimens only), is one component of Kailash Hinze comprehensive MRSA colonization surveillance program. It is not intended to diagnose MRSA infection nor to guide or monitor treatment for MRSA infections. Performed at [facility], [place] [address], [place], [place] [address]      Labs: Basic Metabolic Panel: Recent Labs  Lab 04/13/18 1740  04/16/18 0617 04/17/18 0825 04/18/18 0604 04/19/18 0527 04/20/18 0731  NA 141   < > 142 141 140 138 137  K 3.2*   < > 3.2* 3.5 3.7 3.4* 3.5  CL 101   < > 111 108 107 107 103  CO2 28   < > 22 23 22 24 25   GLUCOSE 132*   < > 84 54* 88 79 82  BUN 51*   < > 29* 33* 38* 29* 21  CREATININE 1.09*   < > 0.82 0.99  0.95 0.72 0.75  CALCIUM 9.7   < > 8.4* 8.5* 8.8* 8.2* 8.2*  MG 2.3  --  1.9  --   --  1.9 1.9   < > = values in this interval not displayed.   Liver Function Tests: Recent Labs  Lab 04/13/18 1740 04/14/18 0656 04/15/18 0544 04/19/18 0527 04/20/18 0731  AST 103* 111* 125* 55* 53*  ALT 30 27 28 20 23   ALKPHOS 249* 221* 310* 270* 275*  BILITOT 0.7 0.5 0.6 0.8 0.9  PROT 6.6 5.3* 5.4* 4.6* 4.9*  ALBUMIN 3.2* 2.5* 2.4* 1.8* 2.1*   Recent Labs  Lab 04/13/18 1740 04/14/18 0656 04/15/18 0544  LIPASE 321* 190* 74*   No results for input(s): AMMONIA in the last 168 hours. CBC: Recent Labs  Lab 04/13/18 1740  04/15/18 0544 04/16/18 0617 04/17/18 0825 04/18/18 0604 04/19/18 0527 04/20/18 0731  WBC 5.2   < > 4.4 5.1 5.6 6.9 6.1 6.6  NEUTROABS 4.2  --  2.8 3.6 4.1  --   --   --   HGB 10.7*   < > 9.0* 8.6* 8.7* 9.6* 8.4* 9.0*  HCT 33.5*   < > 29.1* 27.8* 28.4* 31.4* 26.5* 28.7*  MCV 95.7   < > 97.0 97.2 98.3 96.6 97.1 96.6  PLT 216   < > 182 171 172 218 192 195   < > = values in this interval not displayed.   Cardiac Enzymes: Recent Labs  Lab 04/13/18 1740  TROPONINI <0.03   BNP: BNP (last 3 results) No results for input(s): BNP in the last 8760 hours.  ProBNP (last 3 results) No results for input(s): PROBNP in the last 8760 hours.  CBG: No results for input(s): GLUCAP in the last 168 hours.     Signed:  Fayrene Helper MD.  [facility] 04/20/2018, 2:08 PM

## 2018-04-21 LAB — URINE CULTURE: Culture: 10000 — AB

## 2018-04-25 ENCOUNTER — Inpatient Hospital Stay (HOSPITAL_COMMUNITY)
Admission: EM | Admit: 2018-04-25 | Discharge: 2018-04-30 | DRG: 080 | Disposition: A | Discharge status: Skilled Nursing Facility | Payer: Medicare Other | Attending: Family Medicine | Admitting: Family Medicine

## 2018-04-25 ENCOUNTER — Other Ambulatory Visit: Payer: Self-pay

## 2018-04-25 ENCOUNTER — Emergency Department (HOSPITAL_COMMUNITY): Payer: Medicare Other

## 2018-04-25 ENCOUNTER — Encounter (HOSPITAL_COMMUNITY): Payer: Self-pay | Admitting: Emergency Medicine

## 2018-04-25 DIAGNOSIS — G936 Cerebral edema: Secondary | ICD-10-CM | POA: Diagnosis not present

## 2018-04-25 DIAGNOSIS — E440 Moderate protein-calorie malnutrition: Secondary | ICD-10-CM | POA: Diagnosis present

## 2018-04-25 DIAGNOSIS — T474X5A Adverse effect of other laxatives, initial encounter: Secondary | ICD-10-CM | POA: Diagnosis present

## 2018-04-25 DIAGNOSIS — R4689 Other symptoms and signs involving appearance and behavior: Secondary | ICD-10-CM

## 2018-04-25 DIAGNOSIS — E876 Hypokalemia: Secondary | ICD-10-CM | POA: Diagnosis present

## 2018-04-25 DIAGNOSIS — R4182 Altered mental status, unspecified: Secondary | ICD-10-CM | POA: Diagnosis present

## 2018-04-25 DIAGNOSIS — C50919 Malignant neoplasm of unspecified site of unspecified female breast: Secondary | ICD-10-CM | POA: Diagnosis present

## 2018-04-25 DIAGNOSIS — R627 Adult failure to thrive: Secondary | ICD-10-CM | POA: Diagnosis present

## 2018-04-25 DIAGNOSIS — Z79899 Other long term (current) drug therapy: Secondary | ICD-10-CM

## 2018-04-25 DIAGNOSIS — D649 Anemia, unspecified: Secondary | ICD-10-CM | POA: Diagnosis present

## 2018-04-25 DIAGNOSIS — Z79811 Long term (current) use of aromatase inhibitors: Secondary | ICD-10-CM

## 2018-04-25 DIAGNOSIS — L89152 Pressure ulcer of sacral region, stage 2: Secondary | ICD-10-CM | POA: Diagnosis present

## 2018-04-25 DIAGNOSIS — I10 Essential (primary) hypertension: Secondary | ICD-10-CM | POA: Diagnosis present

## 2018-04-25 DIAGNOSIS — D630 Anemia in neoplastic disease: Secondary | ICD-10-CM | POA: Diagnosis present

## 2018-04-25 DIAGNOSIS — G9341 Metabolic encephalopathy: Secondary | ICD-10-CM | POA: Diagnosis present

## 2018-04-25 DIAGNOSIS — C7931 Secondary malignant neoplasm of brain: Secondary | ICD-10-CM | POA: Diagnosis present

## 2018-04-25 DIAGNOSIS — C7951 Secondary malignant neoplasm of bone: Secondary | ICD-10-CM | POA: Diagnosis present

## 2018-04-25 DIAGNOSIS — R64 Cachexia: Secondary | ICD-10-CM | POA: Diagnosis present

## 2018-04-25 LAB — URINALYSIS, ROUTINE W REFLEX MICROSCOPIC
Bilirubin Urine: NEGATIVE
Glucose, UA: NEGATIVE mg/dL
Hgb urine dipstick: NEGATIVE
Ketones, ur: 5 mg/dL — AB
LEUKOCYTES UA: NEGATIVE
NITRITE: NEGATIVE
PH: 8 (ref 5.0–8.0)
Protein, ur: NEGATIVE mg/dL
Specific Gravity, Urine: 1.009 (ref 1.005–1.030)

## 2018-04-25 LAB — COMPREHENSIVE METABOLIC PANEL
ALT: 29 U/L (ref 0–44)
AST: 51 U/L — ABNORMAL HIGH (ref 15–41)
Albumin: 3 g/dL — ABNORMAL LOW (ref 3.5–5.0)
Alkaline Phosphatase: 318 U/L — ABNORMAL HIGH (ref 38–126)
Anion gap: 12 (ref 5–15)
BUN: 14 mg/dL (ref 8–23)
CO2: 29 mmol/L (ref 22–32)
Calcium: 8.7 mg/dL — ABNORMAL LOW (ref 8.9–10.3)
Chloride: 97 mmol/L — ABNORMAL LOW (ref 98–111)
Creatinine, Ser: 0.75 mg/dL (ref 0.44–1.00)
GFR calc Af Amer: >60 mL/min (ref >60)
GFR calc non Af Amer: >60 mL/min (ref >60)
GLUCOSE: 90 mg/dL (ref 70–99)
Potassium: 2.7 mmol/L — CL (ref 3.5–5.1)
Sodium: 138 mmol/L (ref 135–145)
Total Bilirubin: 0.6 mg/dL (ref 0.3–1.2)
Total Protein: 6.3 g/dL — ABNORMAL LOW (ref 6.5–8.1)

## 2018-04-25 LAB — CBC
HCT: 29 % — ABNORMAL LOW (ref 36.0–46.0)
Hemoglobin: 9.1 g/dL — ABNORMAL LOW (ref 12.0–15.0)
MCH: 29.8 pg (ref 26.0–34.0)
MCHC: 31.4 g/dL (ref 30.0–36.0)
MCV: 95.1 fL (ref 80.0–100.0)
PLATELETS: 297 10*3/uL (ref 150–400)
RBC: 3.05 MIL/uL — ABNORMAL LOW (ref 3.87–5.11)
RDW: 13.8 % (ref 11.5–15.5)
WBC: 8.1 10*3/uL (ref 4.0–10.5)
nRBC: 0 % (ref 0.0–0.2)

## 2018-04-25 LAB — TROPONIN I
Troponin I: 0.03 ng/mL (ref <0.03)
Troponin I: 0.04 ng/mL (ref <0.03)

## 2018-04-25 LAB — LACTIC ACID, PLASMA: Lactic Acid, Venous: 1.7 mmol/L (ref 0.5–1.9)

## 2018-04-25 LAB — MAGNESIUM: Magnesium: 1.5 mg/dL — ABNORMAL LOW (ref 1.7–2.4)

## 2018-04-25 LAB — PHOSPHORUS: Phosphorus: 3 mg/dL (ref 2.5–4.6)

## 2018-04-25 LAB — LIPASE, BLOOD: Lipase: 155 U/L — ABNORMAL HIGH (ref 11–51)

## 2018-04-25 MED ORDER — ENOXAPARIN SODIUM 30 MG/0.3ML ~~LOC~~ SOLN
30.0000 mg | SUBCUTANEOUS | Status: DC
  Administered 2018-04-25: 30 mg via SUBCUTANEOUS
  Filled 2018-04-25: qty 0.3

## 2018-04-25 MED ORDER — POTASSIUM CHLORIDE IN NACL 40-0.9 MEQ/L-% IV SOLN
INTRAVENOUS | Status: DC
  Administered 2018-04-25 – 2018-04-27 (×2): 75 mL/h via INTRAVENOUS
  Filled 2018-04-25 (×3): qty 1000

## 2018-04-25 MED ORDER — SODIUM CHLORIDE 0.9 % IV SOLN
2.0000 g | Freq: Once | INTRAVENOUS | Status: AC
  Administered 2018-04-25: 2 g via INTRAVENOUS
  Filled 2018-04-25: qty 2

## 2018-04-25 MED ORDER — POTASSIUM CHLORIDE 10 MEQ/100ML IV SOLN
10.0000 meq | INTRAVENOUS | Status: AC
  Administered 2018-04-25 (×3): 10 meq via INTRAVENOUS
  Filled 2018-04-25 (×6): qty 100

## 2018-04-25 MED ORDER — DEXAMETHASONE SODIUM PHOSPHATE 4 MG/ML IJ SOLN
4.0000 mg | Freq: Four times a day (QID) | INTRAMUSCULAR | Status: DC
  Administered 2018-04-25 – 2018-04-27 (×6): 4 mg via INTRAVENOUS
  Filled 2018-04-25 (×6): qty 1

## 2018-04-25 MED ORDER — ACETAMINOPHEN 325 MG PO TABS
650.0000 mg | ORAL_TABLET | Freq: Four times a day (QID) | ORAL | Status: DC | PRN
  Administered 2018-04-29 (×3): 650 mg via ORAL
  Filled 2018-04-25 (×3): qty 2

## 2018-04-25 MED ORDER — ACETAMINOPHEN 650 MG RE SUPP: 650.0000 mg | Freq: Four times a day (QID) | RECTAL | Status: DC | PRN

## 2018-04-25 MED ORDER — SODIUM CHLORIDE 0.9 % IV BOLUS: 500.0000 mL | Freq: Once | INTRAVENOUS | Status: DC

## 2018-04-25 MED ORDER — SODIUM CHLORIDE 0.9 % IV BOLUS
1000.0000 mL | Freq: Once | INTRAVENOUS | Status: AC
  Administered 2018-04-25: 1000 mL via INTRAVENOUS

## 2018-04-25 MED ORDER — METHYLPREDNISOLONE SODIUM SUCC 125 MG IJ SOLR: 125.0000 mg | Freq: Once | INTRAMUSCULAR | Status: DC

## 2018-04-25 MED ORDER — MAGNESIUM SULFATE 2 GM/50ML IV SOLN
2.0000 g | Freq: Once | INTRAVENOUS | Status: AC
  Administered 2018-04-25: 2 g via INTRAVENOUS
  Filled 2018-04-25: qty 50

## 2018-04-25 MED ORDER — MORPHINE SULFATE (PF) 2 MG/ML IV SOLN: 2.0000 mg | INTRAVENOUS | Status: DC | PRN

## 2018-04-25 MED ORDER — SODIUM CHLORIDE 0.9 % IV SOLN: 1.0000 g | Freq: Once | INTRAVENOUS | Status: DC

## 2018-04-25 MED ORDER — VANCOMYCIN HCL IN DEXTROSE 750-5 MG/150ML-% IV SOLN
750.0000 mg | Freq: Once | INTRAVENOUS | Status: AC
  Administered 2018-04-25: 750 mg via INTRAVENOUS
  Filled 2018-04-25: qty 150

## 2018-04-25 MED ORDER — DEXAMETHASONE SODIUM PHOSPHATE 4 MG/ML IJ SOLN
8.0000 mg | Freq: Once | INTRAMUSCULAR | Status: AC
  Administered 2018-04-25: 8 mg via INTRAVENOUS
  Filled 2018-04-25: qty 2

## 2018-04-25 MED ORDER — ONDANSETRON HCL 4 MG/2ML IJ SOLN: 4.0000 mg | Freq: Four times a day (QID) | INTRAMUSCULAR | Status: DC | PRN

## 2018-04-25 MED ORDER — ONDANSETRON HCL 4 MG PO TABS: 4.0000 mg | ORAL_TABLET | Freq: Four times a day (QID) | ORAL | Status: DC | PRN

## 2018-04-25 NOTE — ED Triage Notes (Signed)
[facility] noticed that the patient became increasingly weak with altered mental status. She was last seen at her baseline at dinner last night. She was not verbal with EMS. She does complain of pain on her wound. She has cancer but has refused treatment.

## 2018-04-25 NOTE — ED Notes (Signed)
Date and time results received: 04/25/18 1309   Test: potassium and troponin Critical Value: potassium of 2.7 and troponin 0.03  Name of Provider Notified: Bero MD  Orders Received? Or Actions Taken?: acknowledged

## 2018-04-25 NOTE — Progress Notes (Signed)
ED TO INPATIENT HANDOFF REPORT  Name/Age/Gender Morgan Morrow 78 y.o. female  Code Status    Code Status Orders  (From admission, onward)         Start     Ordered   04/25/18 1633  Full code  Continuous     04/25/18 1634        Code Status History    Date Active Date Inactive Code Status Order ID Comments User Context   04/13/2018 2130 04/20/2018 2232 Full Code [id number]  Wouk, Ailene Rud, MD ED      Home/SNF/Other Skilled nursing facility  Chief Complaint cancer/decubitis  Level of Care/Admitting Diagnosis ED Disposition    ED Disposition Condition [place]: [facility] [[id number]]  Level of Care: Telemetry [5]  Admit to tele based on following criteria: Monitor for Ischemic changes  Diagnosis: Altered mental status [780.97.ICD-9-CM]  Admitting Physician: Reubin Milan [[id number]]  Attending Physician: Reubin Milan [[id number]]  PT Class (Do Not Modify): Observation [104]  PT Acc Code (Do Not Modify): Observation [10022]       Medical History Past Medical History:  Diagnosis Date  . Allergy   . Hypertension     Allergies No Known Allergies  IV Location/Drains/Wounds Patient Lines/Drains/Airways Status   Active Line/Drains/Airways    Name:   Placement date:   Placement time:   Site:   Days:   Peripheral IV 04/25/18 Left Antecubital   04/25/18    1240    Antecubital   less than 1   Incision (Closed) 04/16/18 Breast   04/16/18    1418     9   Pressure Injury 04/14/18 Stage II -  Partial thickness loss of dermis presenting as a shallow open ulcer with a red, pink wound bed without slough.   04/14/18    0900     11          Labs/Imaging Results for orders placed or performed during the hospital encounter of 04/25/18 (from the past 48 hour(s))  Lactic acid, plasma     Status: None   Collection Time: 04/25/18 12:45 PM  Result Value Ref Range   Lactic Acid, Venous 1.7 0.5 - 1.9 mmol/L    Comment:  Performed at [facility], [place] [address], [place],  [address]  CBC     Status: Abnormal   Collection Time: 04/25/18  1:09 PM  Result Value Ref Range   WBC 8.1 4.0 - 10.5 K/uL   RBC 3.05 (L) 3.87 - 5.11 MIL/uL   Hemoglobin 9.1 (L) 12.0 - 15.0 g/dL   HCT 29.0 (L) 36.0 - 46.0 %   MCV 95.1 80.0 - 100.0 fL   MCH 29.8 26.0 - 34.0 pg   MCHC 31.4 30.0 - 36.0 g/dL   RDW 13.8 11.5 - 15.5 %   Platelets 297 150 - 400 K/uL   nRBC 0.0 0.0 - 0.2 %    Comment: Performed at [facility], [place] [address], [place],  [address]  Comprehensive metabolic panel     Status: Abnormal   Collection Time: 04/25/18  1:09 PM  Result Value Ref Range   Sodium 138 135 - 145 mmol/L   Potassium 2.7 (LL) 3.5 - 5.1 mmol/L    Comment: CRITICAL RESULT CALLED TO, READ BACK BY AND VERIFIED WITH: Minta Balsam [id number] @ 1404 BY J SCOTTON    Chloride 97 (L) 98 - 111 mmol/L   CO2 29 22 -  32 mmol/L   Glucose, Bld 90 70 - 99 mg/dL   BUN 14 8 - 23 mg/dL   Creatinine, Ser 0.75 0.44 - 1.00 mg/dL   Calcium 8.7 (L) 8.9 - 10.3 mg/dL   Total Protein 6.3 (L) 6.5 - 8.1 g/dL   Albumin 3.0 (L) 3.5 - 5.0 g/dL   AST 51 (H) 15 - 41 U/L   ALT 29 0 - 44 U/L   Alkaline Phosphatase 318 (H) 38 - 126 U/L   Total Bilirubin 0.6 0.3 - 1.2 mg/dL   GFR calc non Af Amer >60 >60 mL/min   GFR calc Af Amer >60 >60 mL/min   Anion gap 12 5 - 15    Comment: Performed at [facility], [place] [address] [place], [place] [address]  Lipase, blood     Status: Abnormal   Collection Time: 04/25/18  1:09 PM  Result Value Ref Range   Lipase 155 (H) 11 - 51 U/L    Comment: Performed at [facility], [place] [address] [place], [place] [address]  Troponin I - ONCE - STAT     Status: Abnormal   Collection Time: 04/25/18  1:09 PM  Result Value Ref Range   Troponin I 0.03 (HH) <0.03 ng/mL    Comment: CRITICAL RESULT CALLED TO, READ BACK BY AND VERIFIED WITH: Minta Balsam  [id number] @ 5366 BY J SCOTTON Performed at [place] [address], [place], [place] [address]    Ct Head Wo Contrast  Result Date: 04/25/2018 CLINICAL DATA:  Altered mental status.  Metastatic breast carcinoma EXAM: CT HEAD WITHOUT CONTRAST TECHNIQUE: Contiguous axial images were obtained from the base of the skull through the vertex without intravenous contrast. COMPARISON:  April 14, 2018 FINDINGS: Brain: There is mild diffuse atrophy. There is apparent edema in the medial right temporal lobe, concerning for potential site of mass. This area on noncontrast enhanced CT measures 1.9 x 1.7 cm. There is decreased attenuation in the mid right cerebellum which is somewhat ill-defined. This area may well represent foci of vasogenic edema. There is no hemorrhage, extra-axial fluid collection, or midline shift. There is small vessel disease throughout the centra semiovale bilaterally, stable from recent prior study. There is evidence of an apparent prior infarct in the right lateral thalamus. There is no acute infarct apparent. Vascular: No hyperdense vessel. There are foci of vascular calcification in the left vertebral artery as well as in both carotid siphon regions. 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 These changes are greatest in the left frontal and posterior parietal regions. Sinuses/Orbits: There is mucosal thickening in several ethmoid air cells. Other visualized paranasal sinuses are clear. Orbits appear symmetric bilaterally. Other: Mastoid air cells are clear. IMPRESSION: 1. Areas concerning for edema in the medial left temporal and mid right cerebellar regions. Potential neoplastic foci in these areas must be of concern. Contrast enhanced CT or MR could be helpful for further evaluation of these areas. It should be noted that solely from an imaging standpoint on noncontrast enhanced CT, edema of this nature could also  be indicative of a nonneoplastic lesion such as abscess. Infarct in these areas is also possible, but the distribution of abnormal attenuation is more suggestive of a lesion other than infarct. 2. There is no midline shift or hemorrhage. No extra-axial fluid collections. 3. 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 4. There is mild atrophy with extensive supratentorial small vessel disease. Prior small apparent infarct in the lateral right thalamus. No acute appearing infarct is demonstrable currently. 5.  There are foci of arterial vascular calcification. 6.  Mucosal thickening noted in several ethmoid air cells. Electronically Signed   By: Lowella Grip III M.D.   On: 04/25/2018 13:36   Dg Chest Port 1 View  Result Date: 04/25/2018 CLINICAL DATA:  Weakness and altered mental status. Metastatic breast cancer. EXAM: PORTABLE CHEST 1 VIEW COMPARISON:  Chest CT 04/14/2018 and chest x-ray 04/13/2018 FINDINGS: The cardiac silhouette, mediastinal and hilar contours are stable. Moderate tortuosity of the thoracic aorta. Density over the right lower chest is likely due to the patient's breast mass. Scattered pulmonary lesions consistent with known pulmonary metastasis although much better seen on the chest CT. Do not see any definite acute overlying pulmonary process. Diffuse osseous metastatic disease is again noted. IMPRESSION: 1. Stable pulmonary and osseous metastatic disease. 2. No definite acute overlying pulmonary process. Electronically Signed   By: Marijo Sanes M.D.   On: 04/25/2018 12:11    Pending Labs Unresulted Labs (From admission, onward)    Start     Ordered   05/02/18 0500  Creatinine, serum  (enoxaparin (LOVENOX)    CrCl >/= 30 ml/min)  Weekly,   R    Comments:  while on enoxaparin therapy    04/25/18 1634   04/26/18 0500  CBC WITH DIFFERENTIAL  Tomorrow morning,   R     04/25/18 1634   04/26/18 0500   Comprehensive metabolic panel  Tomorrow morning,   R     04/25/18 1634   04/25/18 1634  Magnesium  Add-on,   R     04/25/18 1634   04/25/18 1634  Phosphorus  Add-on,   R     04/25/18 1634   04/25/18 1151  Blood culture (routine x 2)  BLOOD CULTURE X 2,   STAT     04/25/18 1150   04/25/18 1150  Urinalysis, Routine w reflex microscopic  ONCE - STAT,   R     04/25/18 1150          Vitals/Pain Today's Vitals   04/25/18 1121 04/25/18 1539 04/25/18 1600 04/25/18 1630  BP: (!) 149/73 (!) 147/69 (!) 146/72 138/69  Pulse: 94 87 85 89  Resp: (!) 32 (!) 31 16 18   Temp: 98 F (36.7 C)     TempSrc: Oral     SpO2: 96% 100% 100% 98%    Isolation Precautions No active isolations  Medications Medications  potassium chloride 10 mEq in 100 mL IVPB (10 mEq Intravenous New Bag/Given 04/25/18 1634)  enoxaparin (LOVENOX) injection 40 mg (has no administration in time range)  0.9 % NaCl with KCl 40 mEq / L  infusion (has no administration in time range)  acetaminophen (TYLENOL) tablet 650 mg (has no administration in time range)    Or  acetaminophen (TYLENOL) suppository 650 mg (has no administration in time range)  ondansetron (ZOFRAN) tablet 4 mg (has no administration in time range)    Or  ondansetron (ZOFRAN) injection 4 mg (has no administration in time range)  sodium chloride 0.9 % bolus 1,000 mL (0 mLs Intravenous Stopped 04/25/18 1350)  vancomycin (VANCOCIN) IVPB 750 mg/150 ml premix (0 mg Intravenous Stopped 04/25/18 1427)  ceFEPIme (MAXIPIME) 2 g in sodium chloride 0.9 % 100 mL IVPB (0 g Intravenous Stopped 04/25/18 1323)  dexamethasone (DECADRON) injection 8  mg (8 mg Intravenous Given 04/25/18 1629)    Mobility walks with device

## 2018-04-25 NOTE — H&P (Signed)
History and Physical    Morgan Morrow:[id number] DOB: [birth year] DOA: 04/25/2018  PCP: Leighton Ruff, MD   Patient coming from: [facility].  I have personally briefly reviewed patient's old medical records in [place]  Chief Complaint: AMS.  HPI: Morgan Morrow is a 78 y.o. female with medical history significant of allergy, hypertension, recently diagnosed with metastatic breast cancer who was brought to the emergency department due to altered mental status since this morning.  She was last seen her baseline yesterday evening.  She is unable to provide further information.  Her daughter and son are present in the emergency department and stated that she was last at her baseline yesterday.  Apparently her room mate may have seen some early signs of confusion yesterday evening but she was still interactive.  She is a scheduled to follow-up with oncology on Friday to discuss biopsy results and treatment options.  The family would like to discuss results with oncology before pursuing further work-up or treatment.  ED Course: White count is 98 F, pulse 94, respirations 32, blood pressure 149/73 mmHg and O2 sat 96% on room air.  The patient received a 1000 mL NS bolus, vancomycin and cefepime.  I added this on metaxalone 8 mg IVP.  Her white count was 8.1, hemoglobin 9.1 g/dL and platelets 297.  Lactic acid was normal.  CMP shows a potassium of 2.7 and chloride was 97 mmol/L.  All other electrolytes are normal.  Renal function and glucose are normal.  Total protein 6.3 and albumin 3.0 g/dL.  AST is 51, ALT is 29, alkaline phosphatase 318 and lipase 155 units/L.  Magnesium is 1.5 and phosphorus 3.0.  Troponin was 0.03 ng/mL.  Her chest radiograph shows stable pulmonary and washes metastatic disease, but no acute cardiopulmonary pathology.  CT head shows areas concerning for edema in the medial left temporal and mid right cerebellar regions.  Potential metastasis is a concern.   Contrast-enhanced CT or MRI of brain suggested.  However, family does not want to have further imaging at this time.  Review of Systems: Unable to obtain..   Past Medical History:  Diagnosis Date  . Allergy   . Hypertension     Past Surgical History:  Procedure Laterality Date  . BREAST BIOPSY Right 04/16/2018   Procedure: RIGHT BREAST BIOPSY AND RIGHT POSTERIOR  SCALP BIOPSY AND TOP SCALP BIOPSY;  Surgeon: Johnathan Hausen, MD;  Location: [facility];  Service: General;  Laterality: Right;  . NO PAST SURGERIES       reports that she has never smoked. She has never used smokeless tobacco. She reports that she does not drink alcohol or use drugs.  No Known Allergies  Family History  Problem Relation Age of Onset  . Hypertension Mother   . Depression Mother   . Hypertension Father   . Arthritis Father    Prior to Admission medications   Medication Sig Start Date End Date Taking? Authorizing Provider  acetaminophen (TYLENOL) 500 MG tablet Take 500 mg by mouth every 6 (six) hours as needed for mild pain.   Yes [provider]  amLODipine (NORVASC) 5 MG tablet Take 5 mg by mouth daily.   Yes [provider]  anastrozole (ARIMIDEX) 1 MG tablet Take 1 tablet (1 mg total) by mouth daily for 30 days. 04/21/18 05/21/18 Yes Morgan Morrow., MD  feeding supplement, ENSURE ENLIVE, (ENSURE ENLIVE) LIQD Take 237 mLs by mouth 2 (two) times daily between meals. 04/20/18  Yes Morgan Morrow., MD  ibuprofen (ADVIL,MOTRIN) 200 MG tablet Take 200-400 mg by mouth every 6 (six) hours as needed for mild pain.    Yes [provider]  lisinopril (PRINIVIL,ZESTRIL) 20 MG tablet Take 20 mg by mouth daily. 04/23/18  Yes [provider]  pantoprazole (PROTONIX) 40 MG tablet Take 1 tablet (40 mg total) by mouth daily. 04/20/18 04/20/19 Yes Morgan Morrow., MD  polyethylene glycol [facility] Floria Raveling) packet Take 17 g by mouth 2 (two) times daily for 30 days. 04/20/18  05/20/18 Yes Morgan Morrow., MD  simethicone (MYLICON) 80 MG chewable tablet Chew 2 tablets (160 mg total) by mouth 4 (four) times daily. 04/20/18  Yes Morgan Morrow., MD    Physical Exam: Vitals:   04/25/18 1121 04/25/18 1539 04/25/18 1600 04/25/18 1630  BP: (!) 149/73 (!) 147/69 (!) 146/72 138/69  Pulse: 94 87 85 89  Resp: (!) 32 (!) 31 16 18   Temp: 98 F (36.7 C)     TempSrc: Oral     SpO2: 96% 100% 100% 98%    Constitutional: Cachectic.  Looks chronically ill. Eyes: PERRL, lids and conjunctivae normal ENMT: Mucous membranes are dry. Posterior pharynx clear of any exudate or lesions. Neck: normal, supple, no masses, no thyromegaly Respiratory: clear to auscultation bilaterally, no wheezing, no crackles. Normal respiratory effort. No accessory muscle use.  Cardiovascular: Regular rate and rhythm, 3/6 SEM, no rubs / gallops. No extremity edema. 2+ pedal pulses. No carotid bruits.  Abdomen: Soft, no tenderness, no masses palpated. No hepatosplenomegaly. Bowel sounds positive.  Musculoskeletal: no clubbing / cyanosis.  Good ROM, no contractures. Normal muscle tone.  Skin: Decreased skin turgor.  No rashes, lesions, ulcers. No induration Neurologic: CN 2-12 seem to be grossly intact.  Moves all extremities. Psychiatric: Somnolent, opens eyes briefly when talk to.  Disoriented to place, time/date and situation.  Labs on Admission: I have personally reviewed following labs and imaging studies  CBC: Recent Labs  Lab 04/19/18 0527 04/20/18 0731 04/25/18 1309  WBC 6.1 6.6 8.1  HGB 8.4* 9.0* 9.1*  HCT 26.5* 28.7* 29.0*  MCV 97.1 96.6 95.1  PLT 192 195 174   Basic Metabolic Panel: Recent Labs  Lab 04/19/18 0527 04/20/18 0731 04/25/18 1309  NA 138 137 138  K 3.4* 3.5 2.7*  CL 107 103 97*  CO2 24 25 29   GLUCOSE 79 82 90  BUN 29* 21 14  CREATININE 0.72 0.75 0.75  CALCIUM 8.2* 8.2* 8.7*  MG 1.9 1.9  --    GFR: Estimated Creatinine Clearance: 38.9 mL/min (by  C-G formula based on SCr of 0.75 mg/dL). Liver Function Tests: Recent Labs  Lab 04/19/18 0527 04/20/18 0731 04/25/18 1309  AST 55* 53* 51*  ALT 20 23 29   ALKPHOS 270* 275* 318*  BILITOT 0.8 0.9 0.6  PROT 4.6* 4.9* 6.3*  ALBUMIN 1.8* 2.1* 3.0*   Recent Labs  Lab 04/25/18 1309  LIPASE 155*   No results for input(s): AMMONIA in the last 168 hours. Coagulation Profile: No results for input(s): INR, PROTIME in the last 168 hours. Cardiac Enzymes: Recent Labs  Lab 04/25/18 1309  TROPONINI 0.03*   BNP (last 3 results) No results for input(s): PROBNP in the last 8760 hours. HbA1C: No results for input(s): HGBA1C in the last 72 hours. CBG: No results for input(s): GLUCAP in the last 168 hours. Lipid Profile: No results for input(s): CHOL, HDL, LDLCALC, TRIG, CHOLHDL, LDLDIRECT in the last  72 hours. Thyroid Function Tests: No results for input(s): TSH, T4TOTAL, FREET4, T3FREE, THYROIDAB in the last 72 hours. Anemia Panel: No results for input(s): VITAMINB12, FOLATE, FERRITIN, TIBC, IRON, RETICCTPCT in the last 72 hours. Urine analysis:    Component Value Date/Time   COLORURINE YELLOW 04/14/2018 0823   APPEARANCEUR CLEAR 04/14/2018 0823   LABSPEC 1.016 04/14/2018 0823   PHURINE 5.0 04/14/2018 0823   GLUCOSEU NEGATIVE 04/14/2018 0823   HGBUR SMALL (A) 04/14/2018 0823   BILIRUBINUR NEGATIVE 04/14/2018 0823   BILIRUBINUR neg 01/02/2014 1034   KETONESUR NEGATIVE 04/14/2018 0823   PROTEINUR NEGATIVE 04/14/2018 0823   UROBILINOGEN 0.2 01/02/2014 1034   NITRITE NEGATIVE 04/14/2018 0823   LEUKOCYTESUR SMALL (A) 04/14/2018 0823    Radiological Exams on Admission: Ct Head Wo Contrast  Result Date: 04/25/2018 CLINICAL DATA:  Altered mental status.  Metastatic breast carcinoma EXAM: CT HEAD WITHOUT CONTRAST TECHNIQUE: Contiguous axial images were obtained from the base of the skull through the vertex without intravenous contrast. COMPARISON:  April 14, 2018 FINDINGS: Brain:  There is mild diffuse atrophy. There is apparent edema in the medial right temporal lobe, concerning for potential site of mass. This area on noncontrast enhanced CT measures 1.9 x 1.7 cm. There is decreased attenuation in the mid right cerebellum which is somewhat ill-defined. This area may well represent foci of vasogenic edema. There is no hemorrhage, extra-axial fluid collection, or midline shift. There is small vessel disease throughout the centra semiovale bilaterally, stable from recent prior study. There is evidence of an apparent prior infarct in the right lateral thalamus. There is no acute infarct apparent. Vascular: No hyperdense vessel. There are foci of vascular calcification in the left vertebral artery as well as in both carotid siphon regions. 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 These changes are greatest in the left frontal and posterior parietal regions. Sinuses/Orbits: There is mucosal thickening in several ethmoid air cells. Other visualized paranasal sinuses are clear. Orbits appear symmetric bilaterally. Other: Mastoid air cells are clear. IMPRESSION: 1. Areas concerning for edema in the medial left temporal and mid right cerebellar regions. Potential neoplastic foci in these areas must be of concern. Contrast enhanced CT or MR could be helpful for further evaluation of these areas. It should be noted that solely from an imaging standpoint on noncontrast enhanced CT, edema of this nature could also be indicative of a nonneoplastic lesion such as abscess. Infarct in these areas is also possible, but the distribution of abnormal attenuation is more suggestive of a lesion other than infarct. 2. There is no midline shift or hemorrhage. No extra-axial fluid collections. 3. 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 4. There is mild atrophy with extensive  supratentorial small vessel disease. Prior small apparent infarct in the lateral right thalamus. No acute appearing infarct is demonstrable currently. 5.  There are foci of arterial vascular calcification. 6.  Mucosal thickening noted in several ethmoid air cells. Electronically Signed   By: Lowella Grip III M.D.   On: 04/25/2018 13:36   Dg Chest Port 1 View  Result Date: 04/25/2018 CLINICAL DATA:  Weakness and altered mental status. Metastatic breast cancer. EXAM: PORTABLE CHEST 1 VIEW COMPARISON:  Chest CT 04/14/2018 and chest x-ray 04/13/2018 FINDINGS: The cardiac silhouette, mediastinal and hilar contours are stable. Moderate tortuosity of the thoracic aorta. Density over the right lower chest is likely  due to the patient's breast mass. Scattered pulmonary lesions consistent with known pulmonary metastasis although much better seen on the chest CT. Do not see any definite acute overlying pulmonary process. Diffuse osseous metastatic disease is again noted. IMPRESSION: 1. Stable pulmonary and osseous metastatic disease. 2. No definite acute overlying pulmonary process. Electronically Signed   By: Marijo Sanes M.D.   On: 04/25/2018 12:11    EKG: Independently reviewed.  Vent. rate 95 BPM PR interval * ms QRS duration 91 ms QT/QTc 373/469 ms P-R-T axes 71 8 76  Sinus rhythm Probable left atrial enlargement Borderline repolarization abnormality  Assessment/Plan Principal Problem:   Altered mental status Likely due to brain mets. Neurochecks every 4 hours. Start dexamethasone 4 mg IVP every 6 hours. The family has declined to pursue MRI at this time. The family would like to talk to oncology before deciding on further treatment.   Further antibiotics deferred given brain mets, absence of fever/leukocytosis and normal lactic acid.  Active Problems:   Metastatic breast cancer (HCC) Analgesics as needed. Consult oncology for biopsy results discussion in a.m. Given clinical picture,  prognosis is poor.    Essential hypertension Currently n.p.o. Hold antihypertensives for now. Monitor blood pressure.    Anemia Monitor hematocrit and hemoglobin.    Hypokalemia Replacing. Supplemental magnesium as needed. Follow-up potassium level.   DVT prophylaxis: Lovenox SQ. Code Status: Full code. Family Communication: Her daughter and son were present in the ED room. Disposition Plan: Observation for IV hydration and electrolyte replacement. Consults called: Admission status: Observation/telemetry.   Reubin Milan MD  04/25/2018, 4:50 PM

## 2018-04-25 NOTE — ED Provider Notes (Signed)
[facility] Emergency Department Provider Note MRN:  [id number]  Arrival date & time: 04/25/18     Chief Complaint   Altered Mental Status   History of Present Illness   Morgan Morrow is a 78 y.o. year-old female with a history of metastatic breast cancer presenting to the ED with chief complaint of altered mental status.  Last known baseline yesterday evening, largely nonverbal since that time, coming from [facility] care facility, report of weakness.  I was unable to obtain an accurate HPI, PMH, or ROS due to the patient's altered mental status.  Review of Systems  Positive for altered mental status.  Patient's Health History    Past Medical History:  Diagnosis Date  . Allergy   . Hypertension     Past Surgical History:  Procedure Laterality Date  . BREAST BIOPSY Right 04/16/2018   Procedure: RIGHT BREAST BIOPSY AND RIGHT POSTERIOR  SCALP BIOPSY AND TOP SCALP BIOPSY;  Surgeon: Johnathan Hausen, MD;  Location: [facility];  Service: General;  Laterality: Right;  . NO PAST SURGERIES      Family History  Problem Relation Age of Onset  . Hypertension Mother   . Depression Mother   . Hypertension Father   . Arthritis Father     Social History   Socioeconomic History  . Marital status: Widowed    Spouse name: Not on file  . Number of children: Not on file  . Years of education: Not on file  . Highest education level: Not on file  Occupational History  . Occupation: retired  Scientific laboratory technician  . Financial resource strain: Not on file  . Food insecurity:    Worry: Not on file    Inability: Not on file  . Transportation needs:    Medical: Not on file    Non-medical: Not on file  Tobacco Use  . Smoking status: Never Smoker  . Smokeless tobacco: Never Used  Substance and Sexual Activity  . Alcohol use: No  . Drug use: No  . Sexual activity: Not Currently    Partners: Male  Lifestyle  . Physical activity:    Days per week: Not on file    Minutes  per session: Not on file  . Stress: Not on file  Relationships  . Social connections:    Talks on phone: Not on file    Gets together: Not on file    Attends religious service: Not on file    Active member of club or organization: Not on file    Attends meetings of clubs or organizations: Not on file    Relationship status: Not on file  . Intimate partner violence:    Fear of current or ex partner: Not on file    Emotionally abused: Not on file    Physically abused: Not on file    Forced sexual activity: Not on file  Other Topics Concern  . 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 OK TO LEAVE MESSAGE ON HOME NUMBER [phone]      Walk daily     Physical Exam  Vital Signs and Nursing Notes reviewed Vitals:   04/25/18 1121  BP: (!) 149/73  Pulse: 94  Resp: (!) 32  Temp: 98 F (36.7 C)  SpO2: 96%    CONSTITUTIONAL: Chronically ill-appearing, NAD, cachectic NEURO: Somnolent, wakes to voice, does not follow commands, does not converse, moves all extremities EYES:  eyes equal and reactive  ENT/NECK:  no LAD, no JVD CARDIO: Regular rate, well-perfused, normal S1 and S2 PULM:  CTAB no wheezing or rhonchi, tachypneic GI/GU:  normal bowel sounds, non-distended, non-tender MSK/SPINE:  No gross deformities, no edema SKIN:  no rash, atraumatic PSYCH:  Appropriate speech and behavior  Diagnostic and Interventional Summary    EKG Interpretation  Date/Time:  Wednesday April 25 2018 13:05:03 EST Ventricular Rate:  95 PR Interval:    QRS Duration: 91 QT Interval:  373 QTC Calculation: 469 R Axis:   8 Text Interpretation:  Sinus rhythm Probable left atrial enlargement Borderline repolarization abnormality Confirmed by Gerlene Fee [phone]) on 04/25/2018 2:50:20 PM      Labs Reviewed  CBC - Abnormal; Notable for the following components:      Result Value   RBC 3.05 (*)    Hemoglobin 9.1 (*)    HCT 29.0 (*)    All other  components within normal limits  COMPREHENSIVE METABOLIC PANEL - Abnormal; Notable for the following components:   Potassium 2.7 (*)    Chloride 97 (*)    Calcium 8.7 (*)    Total Protein 6.3 (*)    Albumin 3.0 (*)    AST 51 (*)    Alkaline Phosphatase 318 (*)    All other components within normal limits  LIPASE, BLOOD - Abnormal; Notable for the following components:   Lipase 155 (*)    All other components within normal limits  TROPONIN I - Abnormal; Notable for the following components:   Troponin I 0.03 (*)    All other components within normal limits  CULTURE, BLOOD (ROUTINE X 2)  CULTURE, BLOOD (ROUTINE X 2)  LACTIC ACID, PLASMA  URINALYSIS, ROUTINE W REFLEX MICROSCOPIC    CT HEAD WO CONTRAST  Final Result    DG Chest Port 1 View  Final Result      Medications  potassium chloride 10 mEq in 100 mL IVPB (has no administration in time range)  sodium chloride 0.9 % bolus 1,000 mL (1,000 mLs Intravenous New Bag/Given 04/25/18 1249)  vancomycin (VANCOCIN) IVPB 750 mg/150 ml premix (750 mg Intravenous New Bag/Given 04/25/18 1327)  ceFEPIme (MAXIPIME) 2 g in sodium chloride 0.9 % 100 mL IVPB (0 g Intravenous Stopped 04/25/18 1323)     Procedures Critical Care Critical Care Documentation Critical care time provided by me (excluding procedures): 37 minutes  Condition necessitating critical care: Hypokalemia, cerebral edema  Components of critical care management: reviewing of prior records, laboratory and imaging interpretation, frequent re-examination and reassessment of vital signs, administration of IV potassium, IV Solu-Medrol, discussion with consulting services.  ED Course and Medical Decision Making  I have reviewed the triage vital signs and the nursing notes.  Pertinent labs & imaging results that were available during my care of the patient were reviewed by me and considered in my medical decision making (see below for details).  Considering metabolic disarray,  intracranial bleeding related to metastasis, UTI, concern for possible sepsis given the altered mental status, tachypnea.  Code sepsis initiated, work-up pending.  Clinical Course as of Apr 25 1450  Wed Apr 25, 2018  1206 Per chart review, during patient's recent admission less than 2 weeks ago there was question of pneumonia on her CT, given the tachypnea and altered mental status today, will treat empirically with Vanco and cefepime.   [MB]    Clinical Course User Index [MB] Maudie Flakes, MD    Labs reveal hypokalemia, CT head reveals edematous foci of the brain  concerning for metastatic lesions.  Patient is without meningismus, no fever, little to no concern for these lesions being infectious in etiology.  Provided with IV Solu-Medrol here in the ED, admitted to hospital service for further care.  Barth Kirks. Sedonia Small, MD [place] [email or web address]  Final Clinical Impressions(s) / ED Diagnoses     ICD-10-CM   1. Hypokalemia E87.6   2. Altered behavior R46.89 DG Chest [facility] 1 View    DG Chest Port 1 View  3. Cerebral edema (HCC) G93.6   4. 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

## 2018-04-25 NOTE — ED Notes (Signed)
Report given to receiving RN.

## 2018-04-25 NOTE — ED Notes (Signed)
Bed: WA04 Expected date:  Expected time:  Means of arrival:  Comments: EMS/cancer/decubitus

## 2018-04-26 ENCOUNTER — Observation Stay (HOSPITAL_COMMUNITY): Payer: Medicare Other

## 2018-04-26 DIAGNOSIS — C50919 Malignant neoplasm of unspecified site of unspecified female breast: Secondary | ICD-10-CM | POA: Diagnosis not present

## 2018-04-26 DIAGNOSIS — Z79811 Long term (current) use of aromatase inhibitors: Secondary | ICD-10-CM | POA: Diagnosis not present

## 2018-04-26 DIAGNOSIS — Z79899 Other long term (current) drug therapy: Secondary | ICD-10-CM | POA: Diagnosis not present

## 2018-04-26 DIAGNOSIS — T474X5A Adverse effect of other laxatives, initial encounter: Secondary | ICD-10-CM | POA: Diagnosis present

## 2018-04-26 DIAGNOSIS — I10 Essential (primary) hypertension: Secondary | ICD-10-CM

## 2018-04-26 DIAGNOSIS — E876 Hypokalemia: Secondary | ICD-10-CM

## 2018-04-26 DIAGNOSIS — E440 Moderate protein-calorie malnutrition: Secondary | ICD-10-CM | POA: Diagnosis present

## 2018-04-26 DIAGNOSIS — G9341 Metabolic encephalopathy: Secondary | ICD-10-CM | POA: Diagnosis present

## 2018-04-26 DIAGNOSIS — R410 Disorientation, unspecified: Secondary | ICD-10-CM | POA: Diagnosis not present

## 2018-04-26 DIAGNOSIS — G936 Cerebral edema: Secondary | ICD-10-CM

## 2018-04-26 DIAGNOSIS — C7931 Secondary malignant neoplasm of brain: Secondary | ICD-10-CM | POA: Diagnosis present

## 2018-04-26 DIAGNOSIS — L89152 Pressure ulcer of sacral region, stage 2: Secondary | ICD-10-CM | POA: Diagnosis present

## 2018-04-26 DIAGNOSIS — D630 Anemia in neoplastic disease: Secondary | ICD-10-CM | POA: Diagnosis present

## 2018-04-26 DIAGNOSIS — R627 Adult failure to thrive: Secondary | ICD-10-CM | POA: Diagnosis present

## 2018-04-26 DIAGNOSIS — R64 Cachexia: Secondary | ICD-10-CM | POA: Diagnosis present

## 2018-04-26 DIAGNOSIS — C50911 Malignant neoplasm of unspecified site of right female breast: Secondary | ICD-10-CM | POA: Diagnosis not present

## 2018-04-26 DIAGNOSIS — C7951 Secondary malignant neoplasm of bone: Secondary | ICD-10-CM | POA: Diagnosis present

## 2018-04-26 LAB — CBC WITH DIFFERENTIAL/PLATELET
Abs Immature Granulocytes: 0.05 10*3/uL (ref 0.00–0.07)
Basophils Absolute: 0 10*3/uL (ref 0.0–0.1)
Basophils Relative: 0 %
Eosinophils Absolute: 0 10*3/uL (ref 0.0–0.5)
Eosinophils Relative: 0 %
HEMATOCRIT: 29 % — AB (ref 36.0–46.0)
Hemoglobin: 9.1 g/dL — ABNORMAL LOW (ref 12.0–15.0)
Immature Granulocytes: 1 %
LYMPHS ABS: 0.9 10*3/uL (ref 0.7–4.0)
LYMPHS PCT: 15 %
MCH: 29.6 pg (ref 26.0–34.0)
MCHC: 31.4 g/dL (ref 30.0–36.0)
MCV: 94.5 fL (ref 80.0–100.0)
Monocytes Absolute: 0.3 10*3/uL (ref 0.1–1.0)
Monocytes Relative: 5 %
Neutro Abs: 4.9 10*3/uL (ref 1.7–7.7)
Neutrophils Relative %: 79 %
Platelets: 328 10*3/uL (ref 150–400)
RBC: 3.07 MIL/uL — ABNORMAL LOW (ref 3.87–5.11)
RDW: 14.3 % (ref 11.5–15.5)
WBC: 6.2 10*3/uL (ref 4.0–10.5)
nRBC: 0 % (ref 0.0–0.2)

## 2018-04-26 LAB — COMPREHENSIVE METABOLIC PANEL
ALT: 23 U/L (ref 0–44)
AST: 43 U/L — AB (ref 15–41)
Albumin: 2.5 g/dL — ABNORMAL LOW (ref 3.5–5.0)
Alkaline Phosphatase: 255 U/L — ABNORMAL HIGH (ref 38–126)
Anion gap: 11 (ref 5–15)
BUN: 24 mg/dL — ABNORMAL HIGH (ref 8–23)
CO2: 23 mmol/L (ref 22–32)
Calcium: 8.3 mg/dL — ABNORMAL LOW (ref 8.9–10.3)
Chloride: 104 mmol/L (ref 98–111)
Creatinine, Ser: 0.81 mg/dL (ref 0.44–1.00)
GFR calc Af Amer: >60 mL/min (ref >60)
GFR calc non Af Amer: >60 mL/min (ref >60)
Glucose, Bld: 73 mg/dL (ref 70–99)
Potassium: 3.8 mmol/L (ref 3.5–5.1)
Sodium: 138 mmol/L (ref 135–145)
Total Bilirubin: 0.6 mg/dL (ref 0.3–1.2)
Total Protein: 5.3 g/dL — ABNORMAL LOW (ref 6.5–8.1)

## 2018-04-26 LAB — TROPONIN I
Troponin I: 0.04 ng/mL (ref <0.03)
Troponin I: 0.03 ng/mL (ref <0.03)

## 2018-04-26 MED ORDER — ADULT MULTIVITAMIN W/MINERALS CH
1.0000 | ORAL_TABLET | Freq: Every day | ORAL | Status: DC
  Administered 2018-04-26 – 2018-04-30 (×5): 1 via ORAL
  Filled 2018-04-26 (×5): qty 1

## 2018-04-26 MED ORDER — LIP MEDEX EX OINT
TOPICAL_OINTMENT | CUTANEOUS | Status: AC
  Administered 2018-04-26: 12:00:00
  Filled 2018-04-26: qty 7

## 2018-04-26 MED ORDER — ENSURE ENLIVE PO LIQD
237.0000 mL | Freq: Two times a day (BID) | ORAL | Status: DC
  Administered 2018-04-27 – 2018-04-30 (×6): 237 mL via ORAL

## 2018-04-26 MED ORDER — GADOBUTROL 1 MMOL/ML IV SOLN
4.0000 mL | Freq: Once | INTRAVENOUS | Status: AC | PRN
  Administered 2018-04-26: 4 mL via INTRAVENOUS

## 2018-04-26 MED ORDER — ANASTROZOLE 1 MG PO TABS
1.0000 mg | ORAL_TABLET | Freq: Every day | ORAL | Status: DC
  Administered 2018-04-26 – 2018-04-30 (×5): 1 mg via ORAL
  Filled 2018-04-26 (×5): qty 1

## 2018-04-26 NOTE — Progress Notes (Signed)
Morgan Morrow   DOB:12-14-1940   EZ#:[id number]   YTK#:[id number]  Subjective:  Morgan Morrow is pleasant and alert, speaks softly but clearly. Cannot give me a history in the past few days. Denies pain and specifically no h/a, N/V, visual changes, neck stiffness. Daughter in room   Objective: older White woman examined in bed Vitals:   04/25/18 2038 04/26/18 0454  BP: 133/77 133/74  Pulse: 91 85  Resp: 20   Temp: 98.4 F (36.9 C) 97.6 F (36.4 C)  SpO2: 93% 93%    Body mass index is 17.95 kg/m.  Intake/Output Summary (Last 24 hours) at 04/26/2018 1313 Last data filed at 04/26/2018 0600 Gross per 24 hour  Intake 1919.43 ml  Output -  Net 1919.43 ml     Sclerae unicteric, EOMs intact, pupils round and equal  Lungs no rales or wheezes--auscultated anterolaterally  Heart regular rate and rhythm  Abdomen soft, +BS  Neuro nonfocal  Breast exam: the right breast area is already a bit drier after only one week on anastrozole  CBG (last 3)  No results for input(s): GLUCAP in the last 72 hours.   Labs:  Lab Results  Component Value Date   WBC 6.2 04/26/2018   HGB 9.1 (L) 04/26/2018   HCT 29.0 (L) 04/26/2018   MCV 94.5 04/26/2018   PLT 328 04/26/2018   NEUTROABS 4.9 04/26/2018    @LASTCHEMISTRY @  Urine Studies No results for input(s): UHGB, CRYS in the last 72 hours.  Invalid input(s): UACOL, UAPR, USPG, UPH, UTP, UGL, UKET, UBIL, UNIT, UROB, ULEU, UEPI, UWBC, URBC, UBAC, CAST, [place], [place]  Basic Metabolic Panel: Recent Labs  Lab 04/20/18 0731 04/25/18 1309 04/26/18 0656  NA 137 138 138  K 3.5 2.7* 3.8  CL 103 97* 104  CO2 25 29 23   GLUCOSE 82 90 73  BUN 21 14 24*  CREATININE 0.75 0.75 0.81  CALCIUM 8.2* 8.7* 8.3*  MG 1.9 1.5*  --   PHOS  --  3.0  --    GFR Estimated Creatinine Clearance: 37.1 mL/min (by C-G formula based on SCr of 0.81 mg/dL). Liver Function Tests: Recent Labs  Lab 04/20/18 0731 04/25/18 1309 04/26/18 0656  AST 53* 51* 43*  ALT 23 29 23    ALKPHOS 275* 318* 255*  BILITOT 0.9 0.6 0.6  PROT 4.9* 6.3* 5.3*  ALBUMIN 2.1* 3.0* 2.5*   Recent Labs  Lab 04/25/18 1309  LIPASE 155*   No results for input(s): AMMONIA in the last 168 hours. Coagulation profile No results for input(s): INR, PROTIME in the last 168 hours.  CBC: Recent Labs  Lab 04/20/18 0731 04/25/18 1309 04/26/18 0656  WBC 6.6 8.1 6.2  NEUTROABS  --   --  4.9  HGB 9.0* 9.1* 9.1*  HCT 28.7* 29.0* 29.0*  MCV 96.6 95.1 94.5  PLT 195 297 328   Cardiac Enzymes: Recent Labs  Lab 04/25/18 1309 04/25/18 1851 04/26/18 0112 04/26/18 0656  TROPONINI 0.03* 0.04* 0.04* 0.03*   BNP: Invalid input(s): POCBNP CBG: No results for input(s): GLUCAP in the last 168 hours. D-Dimer No results for input(s): DDIMER in the last 72 hours. Hgb A1c No results for input(s): HGBA1C in the last 72 hours. Lipid Profile No results for input(s): CHOL, HDL, LDLCALC, TRIG, CHOLHDL, LDLDIRECT in the last 72 hours. Thyroid function studies No results for input(s): TSH, T4TOTAL, T3FREE, THYROIDAB in the last 72 hours.  Invalid input(s): FREET3 Anemia work up No results for input(s): VITAMINB12, FOLATE, FERRITIN, TIBC, IRON, RETICCTPCT  in the last 72 hours. Microbiology No results found for this or any previous visit (from the past 240 hour(s)).    Studies:  Ct Head Wo Contrast  Result Date: 04/25/2018 CLINICAL DATA:  Altered mental status.  Metastatic breast carcinoma EXAM: CT HEAD WITHOUT CONTRAST TECHNIQUE: Contiguous axial images were obtained from the base of the skull through the vertex without intravenous contrast. COMPARISON:  April 14, 2018 FINDINGS: Brain: There is mild diffuse atrophy. There is apparent edema in the medial right temporal lobe, concerning for potential site of mass. This area on noncontrast enhanced CT measures 1.9 x 1.7 cm. There is decreased attenuation in the mid right cerebellum which is somewhat ill-defined. This area may well represent foci  of vasogenic edema. There is no hemorrhage, extra-axial fluid collection, or midline shift. There is small vessel disease throughout the centra semiovale bilaterally, stable from recent prior study. There is evidence of an apparent prior infarct in the right lateral thalamus. There is no acute infarct apparent. Vascular: No hyperdense vessel. There are foci of vascular calcification in the left vertebral artery as well as in both carotid siphon regions. 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 These changes are greatest in the left frontal and posterior parietal regions. Sinuses/Orbits: There is mucosal thickening in several ethmoid air cells. Other visualized paranasal sinuses are clear. Orbits appear symmetric bilaterally. Other: Mastoid air cells are clear. IMPRESSION: 1. Areas concerning for edema in the medial left temporal and mid right cerebellar regions. Potential neoplastic foci in these areas must be of concern. Contrast enhanced CT or MR could be helpful for further evaluation of these areas. It should be noted that solely from an imaging standpoint on noncontrast enhanced CT, edema of this nature could also be indicative of a nonneoplastic lesion such as abscess. Infarct in these areas is also possible, but the distribution of abnormal attenuation is more suggestive of a lesion other than infarct. 2. There is no midline shift or hemorrhage. No extra-axial fluid collections. 3. 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 4. There is mild atrophy with extensive supratentorial small vessel disease. Prior small apparent infarct in the lateral right thalamus. No acute appearing infarct is demonstrable currently. 5.  There are foci of arterial vascular calcification. 6.  Mucosal thickening noted in several ethmoid air cells. Electronically Signed   By: Lowella Grip III  M.D.   On: 04/25/2018 13:36   Dg Chest Port 1 View  Result Date: 04/25/2018 CLINICAL DATA:  Weakness and altered mental status. Metastatic breast cancer. EXAM: PORTABLE CHEST 1 VIEW COMPARISON:  Chest CT 04/14/2018 and chest x-ray 04/13/2018 FINDINGS: The cardiac silhouette, mediastinal and hilar contours are stable. Moderate tortuosity of the thoracic aorta. Density over the right lower chest is likely due to the patient's breast mass. Scattered pulmonary lesions consistent with known pulmonary metastasis although much better seen on the chest CT. Do not see any definite acute overlying pulmonary process. Diffuse osseous metastatic disease is again noted. IMPRESSION: 1. Stable pulmonary and osseous metastatic disease. 2. No definite acute overlying pulmonary process. Electronically Signed   By: Marijo Sanes M.D.   On: 04/25/2018 12:11    Assessment: 78 y.o. Day womanpresenting with weight loss and failure to thrive as well as persistent diarrhea, CT abdomen and pelvis 04/13/2018 showing lytic bone lesions, possible liver and lung lesions, and evidence of proctocolitis, with  exam showing an obvious right-sided breast cancer; now readmitted with altered mental status  (1) metastatic breast cancer: Biopsy of right breast and scalp lesion 04/16/2018, results pending  (2)staging studies: (a) CT scans of the C/A/P/brain 01/10-02/2019 show lung, liver and bone lesions, no brain involvement  (3) .advanced directives: not yet done  (4) anastrozole started 04/17/2018             (a) we will add palbociclib when patient stable  (5) altered mental status:  (a) head CT w/o contrast 04/25/2018 shows edema and areas suspicious for metastases not seen on CT 04/14/2018  (b( brain MRI pending    Plan:  Dwana is tolerating anastrozole well and the visible tumor on her right chest is already showing signs of response.  I discussed the need for an MRI with the patient and daughter.  They agree to proceed. Emphasized the need for Morgan Morrow to be very still during the procedure-- she feels she can do that.  Discussed options if mets to brain found (whole brain vs SRS radiation).  Will place Chaplain consult to try to finalize advanced directives..  Will follow with you   Chauncey Cruel, MD 04/26/2018  1:13 PM Medical Oncology and Hematology [facility] [address] [place], [place] [address] Tel. [phone]    Fax. [phone]

## 2018-04-26 NOTE — Progress Notes (Addendum)
PROGRESS NOTE  Morgan Morrow  XNA:[id number] DOB: 1940-04-23 DOA: 04/25/2018 PCP: Leighton Ruff, MD  Outpatient Specialists: To establish outpatient care with Dr. Jana Hakim, Oncology. Brief Narrative: Morgan Morrow is a 78 y.o. female with a history of HTN and recently diagnosed metastatic breast cancer. She was hospitalized 1/10 - 1/17 and started on anastrozole with plans for outpatient follow up with oncology, discharged to SNF where on 1/22 she was found to be confused, hardly speaking. This AMS continued in the ED where she was afebrile, tachypneic, mildly hypertensive. Urinalysis was negative. CXR showed stable metastatic disease without infiltrate. Empiric vancomycin and cefepime were given for sepsis. Potassium was low at 2.7, supplemented. CT head showed edematous areas in the medial left temporal and mid-right cerebellar regions, felt to favor metastatic foci over infection or infarct. Further neuroimaging was recommended, though patient's family deferred at that time. IV steroids were started and the patient was admitted. Mental status has improved somewhat with improved ability to speak, though she remains significantly altered from baseline  Assessment & Plan: Principal Problem:   Altered mental status Active Problems:   Essential hypertension   Metastatic breast cancer ([place])   Anemia   Hypokalemia  Acute encephalopathy due to cerebral edema, possible brain metastases: Also metabolic encephalopathy due to derangements. This is improved modestly, but remains off baseline, less interactive and not oriented to place or situation.  - Continue IV steroids empirically - Discussed with oncology by phone and daughter at bedside that further definition of diagnosis would be helpful, MRI was recommended and the patient/family agrees. Oncology recommended SRS which has been requested. - Agree with holding antibiotics in the absence of infectious nidus identified (no infiltrate, pyuria,  leukocytosis, fever). Will continue to monitor blood culture data (NGTD) - Will stop pharmaceutical VTE ppx given ?hemorrhage  Metastatic breast CA:  - Continue anastrozole - Dr. Jana Hakim has agreed to see the patient in the hospital today in lieu of tomorrow's scheduled office appointment.  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 No bleeding reported. - Monitor intermittently  Hypokalemia: Due to decreased po intake and GI losses.  - Continue supplementation and monitoring with magnesium  Diarrhea: In the setting of miralax BID prescribed at discharge. - Hold miralax for now, but continue to aim for daily bowel movements in light of fecal impaction which occurred at last hospitalization.   Stage II sacral pressure injury: POA - Offload as able  Moderate protein-calorie malnutrition:  - Dietitian consulted, supplement protein as able. MVM.  DVT prophylaxis: SCDs Code Status: Full Family Communication: Daughter at bedside Disposition Plan: Uncertain, likely return to SNF at discharge. Still requiring IV steroids and not at mental baseline, therefore I believe it is reasonable and appropriate to convert to inpatient status.  Consultants:   Oncology  Procedures:   None  Antimicrobials:  Vancomycin, cefepime x1 1/23.   Subjective: Feels weak. Denies focal numbness, weakness. Does not feel confused but thinks she's at [facility] hospital for diarrhea. Had loose (not watery) stools overnight. No abd pain. Denies HA.  Objective: Vitals:   04/25/18 1800 04/25/18 1803 04/25/18 2038 04/26/18 0454  BP:  (!) 143/74 133/77 133/74  Pulse:  85 91 85  Resp:  16 20   Temp:  98.4 F (36.9 C) 98.4 F (36.9 C) 97.6 F (36.4 C)  TempSrc:  Oral Oral Oral  SpO2:  93% 93% 93%  Weight: 41.7 kg     Height: 5' (  1.524 m)       Intake/Output Summary (Last 24 hours) at 04/26/2018 1227 Last data filed at 04/26/2018 0600 Gross  per 24 hour  Intake 1919.43 ml  Output -  Net 1919.43 ml   Filed Weights   04/25/18 1800  Weight: 41.7 kg    Gen: Thin elderly female in no distress Pulm: Non-labored breathing room air. Clear to auscultation bilaterally.  CV: Regular rate and rhythm. No murmur, rub, or gallop. No JVD, no pedal edema. GI: Abdomen soft, non-tender, non-distended, with normoactive bowel sounds. No organomegaly or masses felt. Ext: Warm, no deformities Skin: No rashes, lesions or ulcers on visualized skin, not rolled per pt request. Neuro: Alert, not oriented to place, time or situation, no aphasia or dysarthria or other focal neurological deficits. Psych: Judgement and insight appear impaired. Mood & affect appropriate. Appears to be in denial regarding malignancy Dx.  Data Reviewed: I have personally reviewed following labs and imaging studies  CBC: Recent Labs  Lab 04/20/18 0731 04/25/18 1309 04/26/18 0656  WBC 6.6 8.1 6.2  NEUTROABS  --   --  4.9  HGB 9.0* 9.1* 9.1*  HCT 28.7* 29.0* 29.0*  MCV 96.6 95.1 94.5  PLT 195 297 458   Basic Metabolic Panel: Recent Labs  Lab 04/20/18 0731 04/25/18 1309 04/26/18 0656  NA 137 138 138  K 3.5 2.7* 3.8  CL 103 97* 104  CO2 25 29 23   GLUCOSE 82 90 73  BUN 21 14 24*  CREATININE 0.75 0.75 0.81  CALCIUM 8.2* 8.7* 8.3*  MG 1.9 1.5*  --   PHOS  --  3.0  --    GFR: Estimated Creatinine Clearance: 37.1 mL/min (by C-G formula based on SCr of 0.81 mg/dL). Liver Function Tests: Recent Labs  Lab 04/20/18 0731 04/25/18 1309 04/26/18 0656  AST 53* 51* 43*  ALT 23 29 23   ALKPHOS 275* 318* 255*  BILITOT 0.9 0.6 0.6  PROT 4.9* 6.3* 5.3*  ALBUMIN 2.1* 3.0* 2.5*   Recent Labs  Lab 04/25/18 1309  LIPASE 155*   No results for input(s): AMMONIA in the last 168 hours. Coagulation Profile: No results for input(s): INR, PROTIME in the last 168 hours. Cardiac Enzymes: Recent Labs  Lab 04/25/18 1309 04/25/18 1851 04/26/18 0112 04/26/18 0656    TROPONINI 0.03* 0.04* 0.04* 0.03*   BNP (last 3 results) No results for input(s): PROBNP in the last 8760 hours. HbA1C: No results for input(s): HGBA1C in the last 72 hours. CBG: No results for input(s): GLUCAP in the last 168 hours. Lipid Profile: No results for input(s): CHOL, HDL, LDLCALC, TRIG, CHOLHDL, LDLDIRECT in the last 72 hours. Thyroid Function Tests: No results for input(s): TSH, T4TOTAL, FREET4, T3FREE, THYROIDAB in the last 72 hours. Anemia Panel: No results for input(s): VITAMINB12, FOLATE, FERRITIN, TIBC, IRON, RETICCTPCT in the last 72 hours. Urine analysis:    Component Value Date/Time   COLORURINE STRAW (A) 04/25/2018 1655   APPEARANCEUR HAZY (A) 04/25/2018 1655   LABSPEC 1.009 04/25/2018 1655   PHURINE 8.0 04/25/2018 1655   GLUCOSEU NEGATIVE 04/25/2018 1655   HGBUR NEGATIVE 04/25/2018 1655   BILIRUBINUR NEGATIVE 04/25/2018 1655   BILIRUBINUR neg 01/02/2014 1034   KETONESUR 5 (A) 04/25/2018 1655   PROTEINUR NEGATIVE 04/25/2018 1655   UROBILINOGEN 0.2 01/02/2014 1034   NITRITE NEGATIVE 04/25/2018 1655   LEUKOCYTESUR NEGATIVE 04/25/2018 1655   No results found for this or any previous visit (from the past 240 hour(s)).    Radiology Studies: Ct  Head Wo Contrast  Result Date: 04/25/2018 CLINICAL DATA:  Altered mental status.  Metastatic breast carcinoma EXAM: CT HEAD WITHOUT CONTRAST TECHNIQUE: Contiguous axial images were obtained from the base of the skull through the vertex without intravenous contrast. COMPARISON:  April 14, 2018 FINDINGS: Brain: There is mild diffuse atrophy. There is apparent edema in the medial right temporal lobe, concerning for potential site of mass. This area on noncontrast enhanced CT measures 1.9 x 1.7 cm. There is decreased attenuation in the mid right cerebellum which is somewhat ill-defined. This area may well represent foci of vasogenic edema. There is no hemorrhage, extra-axial fluid collection, or midline shift. There is small  vessel disease throughout the centra semiovale bilaterally, stable from recent prior study. There is evidence of an apparent prior infarct in the right lateral thalamus. There is no acute infarct apparent. Vascular: No hyperdense vessel. There are foci of vascular calcification in the left vertebral artery as well as in both carotid siphon regions. 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 These changes are greatest in the left frontal and posterior parietal regions. Sinuses/Orbits: There is mucosal thickening in several ethmoid air cells. Other visualized paranasal sinuses are clear. Orbits appear symmetric bilaterally. Other: Mastoid air cells are clear. IMPRESSION: 1. Areas concerning for edema in the medial left temporal and mid right cerebellar regions. Potential neoplastic foci in these areas must be of concern. Contrast enhanced CT or MR could be helpful for further evaluation of these areas. It should be noted that solely from an imaging standpoint on noncontrast enhanced CT, edema of this nature could also be indicative of a nonneoplastic lesion such as abscess. Infarct in these areas is also possible, but the distribution of abnormal attenuation is more suggestive of a lesion other than infarct. 2. There is no midline shift or hemorrhage. No extra-axial fluid collections. 3. 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 4. There is mild atrophy with extensive supratentorial small vessel disease. Prior small apparent infarct in the lateral right thalamus. No acute appearing infarct is demonstrable currently. 5.  There are foci of arterial vascular calcification. 6.  Mucosal thickening noted in several ethmoid air cells. Electronically Signed   By: Lowella Grip III M.D.   On: 04/25/2018 13:36   Dg Chest Port 1 View  Result Date: 04/25/2018 CLINICAL DATA:  Weakness and  altered mental status. Metastatic breast cancer. EXAM: PORTABLE CHEST 1 VIEW COMPARISON:  Chest CT 04/14/2018 and chest x-ray 04/13/2018 FINDINGS: The cardiac silhouette, mediastinal and hilar contours are stable. Moderate tortuosity of the thoracic aorta. Density over the right lower chest is likely due to the patient's breast mass. Scattered pulmonary lesions consistent with known pulmonary metastasis although much better seen on the chest CT. Do not see any definite acute overlying pulmonary process. Diffuse osseous metastatic disease is again noted. IMPRESSION: 1. Stable pulmonary and osseous metastatic disease. 2. No definite acute overlying pulmonary process. Electronically Signed   By: Marijo Sanes M.D.   On: 04/25/2018 12:11    Scheduled Meds: . dexamethasone  4 mg Intravenous Q6H  . enoxaparin (LOVENOX) injection  30 mg Subcutaneous Q24H   Continuous Infusions: . 0.9 % NaCl with KCl 40 mEq / L 75 mL/hr (04/25/18 1946)     LOS: 0 days   Time spent: 35 minutes.  [name], MD [facility] [email or web address] Password [facility] 04/26/2018, 12:27  PM

## 2018-04-26 NOTE — Progress Notes (Signed)
Initial Nutrition Assessment  DOCUMENTATION CODES:   Non-severe (moderate) malnutrition in context of chronic illness, Underweight  INTERVENTION:  - Diet advancement as medically feasible.  - Will order Ensure Enlive BID for once diet advanced to at least FLD, each supplement provides 350 kcal and 20 grams of protein. - Will order daily multivitamin with minerals.    NUTRITION DIAGNOSIS:   Moderate Malnutrition related to chronic illness, cancer and cancer related treatments as evidenced by mild fat depletion, moderate fat depletion, mild muscle depletion, moderate muscle depletion.  GOAL:   Patient will meet greater than or equal to 90% of their needs  MONITOR:   Diet advancement, PO intake, Supplement acceptance, Weight trends, Labs, Skin  REASON FOR ASSESSMENT:   Malnutrition Screening Tool  ASSESSMENT:   78 y.o. female with medical history significant of allergy, HTN, recently diagnosed with metastatic breast cancer. She was brought to the ED due to AMS since the morning of presentation.  She was last seen her baseline the evening before presentation. She is a scheduled to follow-up with oncology on 1/25 to discuss biopsy results and treatment options. The family would like to discuss results with oncology before pursuing further work-up or treatment.  Patient has been NPO since admission. Patient noted to be a/o to self only and unable to provide any nutrition-related information. Patient was recently hospitalized and seen by another RD on 1/14. During that hospitalization she was eating mainly 25-50% of meals.   Per chart review, current weight is 92 lb and weight on 1/10 was 100 lb. This indicates 8 lb weight loss (8% body weight) in the past 2 weeks; unsure if some of this weight may have been related to fluid losses given current IVF. Will continue to monitor weight trends closely.   Per Dr. Lavetta Nielsen note/H&P 1/22 evening: AMS thought to be 2/2 brain mets, family has  declined MRI, metastatic breast cancer with poor prognosis.   Medications reviewed; 2 g IV Mg sulfate x1 run 1/22, 10 mEq IV KCl x3 runs 1/22. Labs reviewed; BUN: 24 mg/dl, Ca: 8.3 mg/dl, Alk Phos elevated.  IVF; NS-40 mEq KCl @ 75 ml/hr.      NUTRITION - FOCUSED PHYSICAL EXAM:    Most Recent Value  Orbital Region  No depletion  Upper Arm Region  Moderate depletion  Thoracic and Lumbar Region  Unable to assess  Buccal Region  Mild depletion  Temple Region  Mild depletion  Clavicle Bone Region  Moderate depletion  Clavicle and Acromion Bone Region  Moderate depletion  Scapular Bone Region  Unable to assess  Dorsal Hand  No depletion  Patellar Region  Mild depletion  Anterior Thigh Region  Moderate depletion  Posterior Calf Region  Moderate depletion  Edema (RD Assessment)  Mild [BLE]  Hair  Reviewed  Eyes  Reviewed  Mouth  Reviewed  Skin  Reviewed  Nails  Reviewed       Diet Order:   Diet Order            DIET SOFT Room service appropriate? Yes; Fluid consistency: Thin  Diet effective now              EDUCATION NEEDS:   Not appropriate for education at this time  Skin:  Skin Assessment: Skin Integrity Issues: Skin Integrity Issues:: Stage II Stage II: sacrum  Last BM:  1/23  Height:   Ht Readings from Last 1 Encounters:  04/25/18 5' (1.524 m)    Weight:   Wt Readings from  Last 1 Encounters:  04/25/18 41.7 kg    Ideal Body Weight:  45.45 kg  BMI:  Body mass index is 17.95 kg/m.  Estimated Nutritional Needs:   Kcal:  1250-1460 kcal  Protein:  55-65 grams  Fluid:  >/= 1.5 L/day     Jarome Matin, MS, RD, LDN, [facility] Inpatient Clinical Dietitian Pager # [phone] After hours/weekend pager # [phone]

## 2018-04-26 NOTE — Progress Notes (Signed)
Paged on call to notify of troponin levels.

## 2018-04-26 NOTE — Progress Notes (Signed)
Patient able to answer in small phrases this a.m. Multiple bowel movements overnight. Vitals stable.

## 2018-04-27 ENCOUNTER — Other Ambulatory Visit: Payer: Self-pay | Admitting: Oncology

## 2018-04-27 ENCOUNTER — Inpatient Hospital Stay: Payer: Medicare Other | Admitting: Oncology

## 2018-04-27 DIAGNOSIS — G936 Cerebral edema: Principal | ICD-10-CM

## 2018-04-27 LAB — BASIC METABOLIC PANEL
ANION GAP: 8 (ref 5–15)
BUN: 37 mg/dL — ABNORMAL HIGH (ref 8–23)
CO2: 23 mmol/L (ref 22–32)
Calcium: 8.4 mg/dL — ABNORMAL LOW (ref 8.9–10.3)
Chloride: 107 mmol/L (ref 98–111)
Creatinine, Ser: 0.84 mg/dL (ref 0.44–1.00)
GFR calc Af Amer: >60 mL/min (ref >60)
Glucose, Bld: 145 mg/dL — ABNORMAL HIGH (ref 70–99)
Potassium: 5.1 mmol/L (ref 3.5–5.1)
Sodium: 138 mmol/L (ref 135–145)

## 2018-04-27 LAB — MAGNESIUM: MAGNESIUM: 2.6 mg/dL — AB (ref 1.7–2.4)

## 2018-04-27 MED ORDER — DEXAMETHASONE SODIUM PHOSPHATE 4 MG/ML IJ SOLN
4.0000 mg | Freq: Two times a day (BID) | INTRAMUSCULAR | Status: DC
  Administered 2018-04-27 – 2018-04-30 (×6): 4 mg via INTRAVENOUS
  Filled 2018-04-27 (×6): qty 1

## 2018-04-27 NOTE — Evaluation (Signed)
Physical Therapy Evaluation Patient Details Name: Morgan Morrow MRN: [id number] DOB: [birth year] Today's Date: 04/27/2018   History of Present Illness  78 yo female admitted from SNF with AMS, cerebral edema possibly due to brain mets. Hx of metastatic breast cancer  Clinical Impression  On eval, pt required Min assist for mobility. She walked ~15 feet x 2 with a RW. Pt presents with general weakness, decreased activity tolerance, and impaired gait and balance. She fatigues fairly easily with activity. She participated well and performed tasks with minimal cueing. Recommend return to SNF for continued rehab.     Follow Up Recommendations SNF    Equipment Recommendations  (TBD at next venue)    Recommendations for Other Services       Precautions / Restrictions Precautions Precautions: Fall Restrictions Weight Bearing Restrictions: No      Mobility  Bed Mobility Overal bed mobility: Needs Assistance Bed Mobility: Supine to Sit     Supine to sit: Min guard     General bed mobility comments: close guard for safety  Transfers Overall transfer level: Needs assistance Equipment used: Rolling walker (2 wheeled) Transfers: Sit to/from Stand Sit to Stand: Min assist         General transfer comment: Assist to rise, stabilize, control descent. VCs safety, hand placement  Ambulation/Gait Ambulation/Gait assistance: Min assist Gait Distance (Feet): 15 Feet(x2) Assistive device: Rolling walker (2 wheeled) Gait Pattern/deviations: Step-through pattern;Decreased stride length;Staggering right;Staggering left     General Gait Details: Assist to stabilize pt throughout distanace. Cues for safety. Pt fatigues easily.   Stairs            Wheelchair Mobility    Modified Rankin (Stroke Patients Only)       Balance Overall balance assessment: Needs assistance         Standing balance support: Bilateral upper extremity supported Standing balance-Leahy Scale:  Poor                               Pertinent Vitals/Pain Pain Assessment: No/denies pain    Home Living Family/patient expects to be discharged to:: Skilled nursing facility                      Prior Function                 Hand Dominance        Extremity/Trunk Assessment   Upper Extremity Assessment Upper Extremity Assessment: Generalized weakness    Lower Extremity Assessment Lower Extremity Assessment: Generalized weakness    Cervical / Trunk Assessment Cervical / Trunk Assessment: Normal  Communication      Cognition Arousal/Alertness: Awake/alert Behavior During Therapy: WFL for tasks assessed/performed Overall Cognitive Status: Within Functional Limits for tasks assessed                                        General Comments      Exercises     Assessment/Plan    PT Assessment Patient needs continued PT services  PT Problem List Decreased strength;Decreased balance;Decreased mobility;Decreased activity tolerance;Decreased knowledge of use of DME       PT Treatment Interventions DME instruction;Functional mobility training;Patient/family education;Gait training;Therapeutic activities;Therapeutic exercise    PT Goals (Current goals can be found in the Care Plan section)  Acute Rehab PT Goals Patient Stated Goal:  none stated per pt; per family, for pt to get care she needs, go to rehab PT Goal Formulation: With patient Time For Goal Achievement: 05/11/18 Potential to Achieve Goals: Fair    Frequency Min 2X/week   Barriers to discharge        Co-evaluation               AM-PAC PT "6 Clicks" Mobility  Outcome Measure Help needed turning from your back to your side while in a flat bed without using bedrails?: A Little Help needed moving from lying on your back to sitting on the side of a flat bed without using bedrails?: A Little Help needed moving to and from a bed to a chair (including a  wheelchair)?: A Little Help needed standing up from a chair using your arms (e.g., wheelchair or bedside chair)?: A Little Help needed to walk in hospital room?: A Little Help needed climbing 3-5 steps with a railing? : A Little 6 Click Score: 18    End of Session   Activity Tolerance: Patient limited by fatigue Patient left: in bed;with call bell/phone within reach;with bed alarm set   PT Visit Diagnosis: Muscle weakness (generalized) (M62.81);Unsteadiness on feet (R26.81)    Time: 1552-0802 PT Time Calculation (min) (ACUTE ONLY): 12 min   Charges:   PT Evaluation $PT Eval Moderate Complexity: [place], PT Acute Rehabilitation Services Pager: [phone] Office: [phone]

## 2018-04-27 NOTE — Progress Notes (Signed)
Clinical Social Worker contacted patients daughter via phone. CSW spoke with [name] to go over discharge plans back to [facility]. [name] stated she is agreeable for patient to return back to facility and understands the process with insurance authorization. [name] aware that authorization has been started by facility. CSW will continue to follow family for discharge needs.   Rhea Pink, MSW,  [place]

## 2018-04-27 NOTE — Care Management Note (Signed)
Case Management Note  Patient Details  Name: Morgan Morrow MRN: [id number] Date of Birth: 1940/10/10  Subjective/Objective: AMS. From [facility]. PT cons-await recc.                   Action/Plan:d/c ALF   Expected Discharge Date:  (unknown)               Expected Discharge Plan:  Assisted Living / Rest Home  In-House Referral:  Clinical Social Work  Discharge planning Services  CM Consult  Post Acute Care Choice:    Choice offered to:     DME Arranged:    DME Agency:     HH Arranged:    [place] Agency:     Status of Service:  In process, will continue to follow  If discussed at Long Length of Stay Meetings, dates discussed:    Additional Comments:  Dessa Phi, RN 04/27/2018, 10:51 AM

## 2018-04-27 NOTE — Progress Notes (Signed)
PROGRESS NOTE  Morgan Morgan Morrow  RKY:[id number] DOB: [birth year] DOA: 04/25/2018 PCP: Leighton Ruff, MD  Outpatient Specialists: To establish outpatient care with Dr. Jana Hakim, Oncology. Brief Narrative: Morgan Morgan Morrow is a 78 y.o. female with a history of HTN and recently diagnosed metastatic breast cancer. She was hospitalized 1/10 - 1/17 and started on anastrozole with plans for outpatient follow up with oncology, discharged to SNF where on 1/22 she was found to be confused, hardly speaking. This AMS continued in the ED where she was afebrile, tachypneic, mildly hypertensive. Urinalysis was negative. CXR showed stable metastatic disease without infiltrate. Empiric vancomycin and cefepime were given for sepsis. Potassium was low at 2.7, supplemented. CT head showed edematous areas in the medial left temporal and mid-right cerebellar regions, felt to favor metastatic foci over infection or infarct. Further neuroimaging was recommended, though patient's family deferred at that time. IV steroids were started and the patient was admitted. Mental status has improved. PT continues to recommend SNF due to significant weakness, though return to facility is delayed by insurance authorization. She is medically stable.  Assessment & Plan: Principal Problem:   Altered mental status Active Problems:   Essential hypertension   Metastatic breast cancer (HCC)   Anemia   Hypokalemia   Cerebral edema (HCC)  Acute encephalopathy due to cerebral edema, dural invasion of calvarial osseous metastasis: Also metabolic encephalopathy due to derangements. This is improved. - Continue IV steroids. Discussed with Dr. Jana Hakim who recommends continuing 76m po BID once discharged.  - Agree with holding antibiotics in the absence of infectious nidus identified (no infiltrate, pyuria, leukocytosis, fever). Will continue to monitor blood culture data (NGTD) - Stopped pharmaceutical VTE ppx given ?hemorrhage  Metastatic  breast CA:  - Continue anastrozole, having local response per oncology.  - Pt will follow up with Dr. MJana Hakimnext week. He is coordinating care with neuro-oncology, Dr. VMickeal Skinnerfor further recommendations.   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 No bleeding reported. - Monitor intermittently  Hypokalemia: Due to decreased po intake and GI losses.  - Resolved, GI losses less and po intake improved. Will stop supplementaion and IV fluids.   Diarrhea: In the setting of miralax BID prescribed at discharge. Improving. - Hold miralax, continue to aim for daily bowel movements in light of fecal impaction which occurred at last hospitalization.   Stage II sacral pressure injury: POA - Offload as able  Moderate protein-calorie malnutrition:  - Dietitian consulted, supplement protein as able. MVM.  DVT prophylaxis: SCDs Code Status: Full Family Communication: Daughter by phone today notified of stability for discharge.  Disposition Plan: SNF once insurance authorization obtained.  Consultants:   Oncology  Procedures:   None  Antimicrobials:  Vancomycin, cefepime x1 1/23.   Subjective: Still weak, [facility] Eating better, no reports of diarrhea. No abdominal pain. Now oriented to person, place, time, situation.  Objective: Vitals:   04/26/18 0454 04/26/18 1453 04/26/18 2057 04/27/18 0456  BP: 133/74 134/68 (!) 146/66 (!) 142/79  Pulse: 85 89 86 84  Resp:  16 16 16   Temp: 97.6 F (36.4 C) 97.8 F (36.6 C) 99 F (37.2 C) 98.2 F (36.8 C)  TempSrc: Oral Oral Oral Oral  SpO2: 93% 95% 90% 94%  Weight:      Height:        Intake/Output Summary (Last 24 hours) at 04/27/2018 1308 Last data filed at 04/27/2018 1000 Gross per 24  hour  Intake 2105.72 ml  Output -  Net 2105.72 ml   Filed Weights   04/25/18 1800  Weight: 41.7 kg   Gen: 78 y.o. female in no distress Pulm:  Nonlabored breathing room air. Clear. CV: Regular rate and rhythm. No murmur, rub, or gallop. No JVD, no dependent edema. GI: Abdomen soft, non-tender, non-distended, with normoactive bowel sounds.  Ext: Warm, no deformities Skin: Right breast lesion without surrounding erythema, induration/discharge or fluctuance. Dry. Neuro: Alert and oriented. No focal neurological deficits. Psych: Judgement and insight appear fair. Mood euthymic & affect congruent. Behavior is appropriate.    Data Reviewed: I have personally reviewed following labs and imaging studies  CBC: Recent Labs  Lab 04/25/18 1309 04/26/18 0656  WBC 8.1 6.2  NEUTROABS  --  4.9  HGB 9.1* 9.1*  HCT 29.0* 29.0*  MCV 95.1 94.5  PLT 297 300   Basic Metabolic Panel: Recent Labs  Lab 04/25/18 1309 04/26/18 0656 04/27/18 0539  NA 138 138 138  K 2.7* 3.8 5.1  CL 97* 104 107  CO2 29 23 23   GLUCOSE 90 73 145*  BUN 14 24* 37*  CREATININE 0.75 0.81 0.84  CALCIUM 8.7* 8.3* 8.4*  MG 1.5*  --  2.6*  PHOS 3.0  --   --    GFR: Estimated Creatinine Clearance: 35.7 mL/min (by C-G formula based on SCr of 0.84 mg/dL). Liver Function Tests: Recent Labs  Lab 04/25/18 1309 04/26/18 0656  AST 51* 43*  ALT 29 23  ALKPHOS 318* 255*  BILITOT 0.6 0.6  PROT 6.3* 5.3*  ALBUMIN 3.0* 2.5*   Recent Labs  Lab 04/25/18 1309  LIPASE 155*   No results for input(s): AMMONIA in the last 168 hours. Coagulation Profile: No results for input(s): INR, PROTIME in the last 168 hours. Cardiac Enzymes: Recent Labs  Lab 04/25/18 1309 04/25/18 1851 04/26/18 0112 04/26/18 0656  TROPONINI 0.03* 0.04* 0.04* 0.03*   BNP (last 3 results) No results for input(s): PROBNP in the last 8760 hours. HbA1C: No results for input(s): HGBA1C in the last 72 hours. CBG: No results for input(s): GLUCAP in the last 168 hours. Lipid Profile: No results for input(s): CHOL, HDL, LDLCALC, TRIG, CHOLHDL, LDLDIRECT in the last 72 hours. Thyroid Function  Tests: No results for input(s): TSH, T4TOTAL, FREET4, T3FREE, THYROIDAB in the last 72 hours. Anemia Panel: No results for input(s): VITAMINB12, FOLATE, FERRITIN, TIBC, IRON, RETICCTPCT in the last 72 hours. Urine analysis:    Component Value Date/Time   COLORURINE STRAW (A) 04/25/2018 1655   APPEARANCEUR HAZY (A) 04/25/2018 1655   LABSPEC 1.009 04/25/2018 1655   PHURINE 8.0 04/25/2018 1655   GLUCOSEU NEGATIVE 04/25/2018 1655   HGBUR NEGATIVE 04/25/2018 1655   BILIRUBINUR NEGATIVE 04/25/2018 1655   BILIRUBINUR neg 01/02/2014 1034   KETONESUR 5 (A) 04/25/2018 1655   PROTEINUR NEGATIVE 04/25/2018 1655   UROBILINOGEN 0.2 01/02/2014 1034   NITRITE NEGATIVE 04/25/2018 1655   LEUKOCYTESUR NEGATIVE 04/25/2018 1655   No results found for this or any previous visit (from the past 240 hour(s)).    Radiology Studies: Ct Head Wo Contrast  Result Date: 04/25/2018 CLINICAL DATA:  Altered mental status.  Metastatic breast carcinoma EXAM: CT HEAD WITHOUT CONTRAST TECHNIQUE: Contiguous axial images were obtained from the base of the skull through the vertex without intravenous contrast. COMPARISON:  April 14, 2018 FINDINGS: Brain: There is mild diffuse atrophy. There is apparent edema in the medial right temporal lobe, concerning for potential site of mass.  This area on noncontrast enhanced CT measures 1.9 x 1.7 cm. There is decreased attenuation in the mid right cerebellum which is somewhat ill-defined. This area may well represent foci of vasogenic edema. There is no hemorrhage, extra-axial fluid collection, or midline shift. There is small vessel disease throughout the centra semiovale bilaterally, stable from recent prior study. There is evidence of an apparent prior infarct in the right lateral thalamus. There is no acute infarct apparent. Vascular: No hyperdense vessel. There are foci of vascular calcification in the left vertebral artery as well as in both carotid siphon regions. Skull: There are  multiple lytic bony metastases in the calvarium, also noted on recent prior study. These changes are greatest in the left frontal and posterior parietal regions. Sinuses/Orbits: There is mucosal thickening in several ethmoid air cells. Other visualized paranasal sinuses are clear. Orbits appear symmetric bilaterally. Other: Mastoid air cells are clear. IMPRESSION: 1. Areas concerning for edema in the medial left temporal and mid right cerebellar regions. Potential neoplastic foci in these areas must be of concern. Contrast enhanced CT or MR could be helpful for further evaluation of these areas. It should be noted that solely from an imaging standpoint on noncontrast enhanced CT, edema of this nature could also be indicative of a nonneoplastic lesion such as abscess. Infarct in these areas is also possible, but the distribution of abnormal attenuation is more suggestive of a lesion other than infarct. 2. There is no midline shift or hemorrhage. No extra-axial fluid collections. 3. 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 4. There is mild atrophy with extensive supratentorial small vessel disease. Prior small apparent infarct in the lateral right thalamus. No acute appearing infarct is demonstrable currently. 5.  There are foci of arterial vascular calcification. 6.  Mucosal thickening noted in several ethmoid air cells. Electronically Signed   By: Lowella Grip III M.D.   On: 04/25/2018 13:36   Mr Jeri Cos DG Contrast  Result Date: 04/26/2018 CLINICAL DATA:  78 y/o  F; metastatic breast cancer. EXAM: MRI HEAD WITHOUT AND WITH CONTRAST TECHNIQUE: Multiplanar, multiecho pulse sequences of the brain and surrounding structures were obtained without and with intravenous contrast. CONTRAST:  4 cc Gadavist COMPARISON:  04/25/2018 CT head. FINDINGS: Brain: Right posterolateral frontal region irregular dural thickening contiguous with a  calvarial metastasis indicating dural invasion (series 10, image 115). No abnormal enhancement of the brain parenchyma. No reduced diffusion to suggest acute or early subacute infarction. Punctate foci of susceptibility hypointensity within left lentiform nucleus and right frontal periventricular white matter compatible hemosiderin deposition of chronic microhemorrhage. No extra-axial collection, hydrocephalus, or herniation. Confluent nonspecific T2 FLAIR hyperintensities in subcortical and periventricular white matter as well as the pons are compatible with moderate chronic microvascular ischemic changes for age. Moderate volume loss of the brain. Vascular: Normal flow voids. Skull and upper cervical spine: Numerous low T1 signal foci within the calvarium with diffusion hyperintensity enhancement compatible with osseous metastatic disease. The lesion in the left paramedian parietal region is mildly expansile into the scalp. Sinuses/Orbits: Negative. Other: None. IMPRESSION: 1. Multiple calvarial metastasis. Small area of irregular dural thickening associated with a calvarial metastasis in right frontal region compatible with dural invasion. 2. No brain parenchymal metastasis identified. 3. Moderate chronic microvascular ischemic changes and volume loss of the brain. Electronically Signed   By: Kristine Garbe M.D.   On: 04/26/2018 16:10    Scheduled Meds: . anastrozole  1 mg Oral Daily  . feeding supplement (ENSURE ENLIVE)  237 mL Oral BID BM  . multivitamin with minerals  1 tablet Oral Daily   Continuous Infusions:    LOS: 1 day   Time spent: 25 minutes.  [name], MD [facility] [email or web address] Password [email or web address] 04/27/2018, 1:08 PM

## 2018-04-27 NOTE — Clinical Social Work Note (Signed)
Clinical Social Work Assessment  Patient Details  Name: Morgan Morrow MRN: [id number] Date of Birth: [birth year]  Date of referral:  04/27/18               Reason for consult:  Facility Placement, Discharge Planning                Permission sought to share information with:  Family Supports Permission granted to share information::  Yes, Verbal Permission Granted  Name::     astra gregg  Agency::  [facility]  Relationship::  daughter  Contact Information:  [phone]  Housing/Transportation Living arrangements for the past 2 months:  Apartment Source of Information:  Patient Patient Interpreter Needed:  None Criminal Activity/Legal Involvement Pertinent to Current Situation/Hospitalization:  No - Comment as needed Significant Relationships:  Adult Children Lives with:  Self Do you feel safe going back to the place where you live?  Yes Need for family participation in patient care:  Yes (Comment)  Care giving concerns:  No family at bedside. Patient stated her daughter [name] is involved and stated she would like CSW to contact daughter to make her aware of discharge plans.   Social Worker assessment / plan:CSW met patient at bedside to offer support and discuss discharge plans.  Patient stated she came from [place] and has been at facility for a week and would like to return back for rehab. CSW stated to patient that she will start authorization through insurance and once insurance has been obtain patient will discharge back to [place]. CSW spoke with admission coordinator(Tammy)  to make her aware of patients discharge. Tammy stated she will start authorization and will let CSW once Josem Kaufmann has been given by [facility]    Employment status:  Retired Nurse, adult PT Recommendations:  [place] / Referral to community resources:  [place]  Patient/Family's Response to care:  Patient appreciates CSW in  care  Patient/Family's Understanding of and Emotional Response to Diagnosis, Current Treatment, and Prognosis:  Patient agreeable with discharge plans back to [facility]  Emotional Assessment Appearance:  Appears stated age Attitude/Demeanor/Rapport:  Engaged Affect (typically observed):  Accepting Orientation:  Oriented to Self, Oriented to Place, Oriented to  Time, Oriented to Situation Alcohol / Substance use:    Psych involvement (Current and /or in the community):     Discharge Needs  Concerns to be addressed:  Adjustment to Illness, Care Coordination Readmission within the last 30 days:  No Current discharge risk:  Dependent with Mobility Barriers to Discharge:  [place], LCSW 04/27/2018, 12:05 PM

## 2018-04-27 NOTE — NC FL2 (Signed)
[place] LEVEL OF CARE SCREENING TOOL     IDENTIFICATION  Patient Name: Morgan Morrow Birthdate: Jul 13, 1940 Sex: female Admission Date (Current Location): 04/25/2018  [facility] and [place] Number:  Herbalist and Address:  [facility],  [place], [place]      Provider Number: [id number]  Attending Physician Name and Address:  [name], MD  Relative Name and Phone Number:       Current Level of Care:   Recommended Level of Care: [place] Prior Approval Number:    Date Approved/Denied: 04/27/18 PASRR Number: [id number]  Discharge Plan: SNF    Current Diagnoses: Patient Active Problem List   Diagnosis Date Noted  . Cerebral edema (HCC)   . Altered mental status 04/25/2018  . Malnutrition of moderate degree 04/17/2018  . Breast mass, probable cancer 04/15/2018  . Scalp mass, probable breast cancer metastasis 04/15/2018  . Stercoral proctocolitis of rectum 04/15/2018  . Metastatic breast cancer ([place]) 04/14/2018  . FTT (failure to thrive) in adult 04/14/2018  . Anemia 04/14/2018  . Murmur, cardiac 04/14/2018  . Diarrhea   . Fecal impaction in rectum ([place])   . Hypokalemia   . Cachexia ([place]) 04/13/2018  . Hematochezia 04/13/2018  . Community acquired pneumonia 04/13/2018  . Constipation, chronic 04/13/2018  . 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 Cough 11/01/2010  . HYPERGLYCEMIA 10/23/2009  . PAP SMEAR, ABNORMAL 02/10/2007  . HEMOCCULT POSITIVE STOOL 02/06/2007  . Essential hypertension 10/23/2006  . ALLERGIC RHINITIS 10/23/2006    Orientation RESPIRATION BLADDER Height & Weight     Self, Time, Situation, Place  Normal Incontinent Weight: 91 lb 14.9 oz (41.7 kg) Height:  5' (152.4 cm)  BEHAVIORAL SYMPTOMS/MOOD NEUROLOGICAL BOWEL NUTRITION STATUS      Incontinent Diet(see dc summary)  AMBULATORY STATUS COMMUNICATION OF NEEDS Skin   Limited Assist Verbally                          Personal Care Assistance Level of Assistance    Bathing Assistance: Limited assistance Feeding assistance: Independent Dressing Assistance: Limited assistance     Functional Limitations Info    Sight Info: Impaired Hearing Info: Impaired Speech Info: Adequate    SPECIAL CARE FACTORS FREQUENCY  PT (By licensed PT), OT (By licensed OT)     PT Frequency: 5x wk OT Frequency: 5x wk            Contractures Contractures Info: Not present    Additional Factors Info    Code Status Info: Full Code Allergies Info: NKA           Current Medications (04/27/2018):  This is the current hospital active medication list Current Facility-Administered Medications  Medication Dose Route Frequency Provider Last Rate Last Dose  . acetaminophen (TYLENOL) tablet 650 mg  650 mg Oral Q6H PRN Reubin [place], MD       Or  . acetaminophen (TYLENOL) suppository 650 mg  650 mg Rectal Q6H PRN Reubin [place], MD      . anastrozole (ARIMIDEX) tablet 1 mg  1 mg Oral Daily [name], MD   1 mg at 04/26/18 1808  . feeding supplement (ENSURE ENLIVE) (ENSURE ENLIVE) liquid 237 mL  237 mL Oral BID BM [name], MD   237 mL at 04/27/18 0915  . morphine 2 MG/ML injection 2 mg  2 mg Intravenous Q2H PRN Reubin Milan, MD      .  multivitamin with minerals tablet 1 tablet  1 tablet Oral Daily [name], MD   1 tablet at 04/26/18 1808  . ondansetron (ZOFRAN) tablet 4 mg  4 mg Oral Q6H PRN Reubin Milan, MD       Or  . ondansetron [facility]) injection 4 mg  4 mg Intravenous Q6H PRN Reubin Milan, MD         Discharge Medications: Please see discharge summary for a list of discharge medications.  Relevant Imaging Results:  Relevant Lab Results:   Additional Information ssn: [id number]  Wende Neighbors, LCSW

## 2018-04-27 NOTE — Progress Notes (Signed)
   04/27/18 1517  Clinical Encounter Type  Visited With Patient and family together  Visit Type Initial  Referral From Physician  Consult/Referral To Chaplain  The chaplain responded to Pt. spiritual care consult for an AD.  The Pt. and the Pt. son were present in the room during the AD conversation. After completing the AD education, the Pt. decided to talk to her children to confirm designating a HCPOA or following [place] Hierarchy for decision making.  The Pt. plans to communicate with her family before preparing a Living Will.  The chaplain offered her support during the AD process and future spiritual care.

## 2018-04-28 MED ORDER — LOPERAMIDE HCL 2 MG PO CAPS: 2.0000 mg | ORAL_CAPSULE | ORAL | Status: DC | PRN

## 2018-04-28 MED ORDER — PANTOPRAZOLE SODIUM 40 MG PO TBEC
40.0000 mg | DELAYED_RELEASE_TABLET | Freq: Every day | ORAL | Status: DC
  Administered 2018-04-28 – 2018-04-30 (×3): 40 mg via ORAL
  Filled 2018-04-28 (×3): qty 1

## 2018-04-28 MED ORDER — LISINOPRIL 20 MG PO TABS
20.0000 mg | ORAL_TABLET | Freq: Every day | ORAL | Status: DC
  Administered 2018-04-28 – 2018-04-30 (×3): 20 mg via ORAL
  Filled 2018-04-28 (×3): qty 1

## 2018-04-28 MED ORDER — AMLODIPINE BESYLATE 5 MG PO TABS
5.0000 mg | ORAL_TABLET | Freq: Every day | ORAL | Status: DC
  Administered 2018-04-28 – 2018-04-30 (×3): 5 mg via ORAL
  Filled 2018-04-28 (×3): qty 1

## 2018-04-28 MED ORDER — ENOXAPARIN SODIUM 30 MG/0.3ML ~~LOC~~ SOLN
30.0000 mg | SUBCUTANEOUS | Status: DC
  Administered 2018-04-28 – 2018-04-29 (×2): 30 mg via SUBCUTANEOUS
  Filled 2018-04-28 (×2): qty 0.3

## 2018-04-28 NOTE — Progress Notes (Signed)
PROGRESS NOTE  Morgan Morrow  BWI:[id number] DOB: 1941-01-31 DOA: 04/25/2018 PCP: Leighton Ruff, MD  Outpatient Specialists: To establish outpatient care with Dr. Jana Hakim, Oncology. Brief Narrative: Morgan Morrow is a 78 y.o. female with a history of HTN and recently diagnosed metastatic breast cancer. She was hospitalized 1/10 - 1/17 and started on anastrozole with plans for outpatient follow up with oncology, discharged to SNF where on 1/22 she was found to be confused, hardly speaking. This AMS continued in the ED where she was afebrile, tachypneic, mildly hypertensive. Urinalysis was negative. CXR showed stable metastatic disease without infiltrate. Empiric vancomycin and cefepime were given for sepsis. Potassium was low at 2.7, supplemented. CT head showed edematous areas in the medial left temporal and mid-right cerebellar regions, felt to favor metastatic foci over infection or infarct. Further neuroimaging was recommended, though patient's family deferred at that time. IV steroids were started and the patient was admitted. Mental status has improved. PT continues to recommend SNF due to significant weakness, though return to facility is delayed by insurance authorization. She is medically stable.  Assessment & Plan: Principal Problem:   Altered mental status Active Problems:   Essential hypertension   Metastatic breast cancer (HCC)   Anemia   Hypokalemia   Cerebral edema (HCC)  Acute encephalopathy due to cerebral edema, dural invasion of calvarial osseous metastasis: Also metabolic encephalopathy due to derangements. This is improved. - Continue IV steroids. Discussed with Dr. Jana Hakim who recommends continuing 60m po BID once discharged.  - Agree with holding antibiotics in the absence of infectious nidus identified (no infiltrate, pyuria, leukocytosis, fever). Remains afebrile. Blood cultures NGTD x3 d. - Stopped pharmaceutical VTE ppx given ?hemorrhage. Ok to restart  now.  Metastatic breast CA:  - Continue anastrozole, having local response per oncology.  - Pt will follow up with Dr. MJana Hakimnext week. He is coordinating care with neuro-oncology, Dr. VMickeal Skinnerfor further recommendations.   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 No bleeding reported. - Monitor intermittently  Hypokalemia: Due to decreased po intake and GI losses.  - Resolved, GI losses less and po intake improved. Stopped supplementaion and IV fluids.   Diarrhea: In the setting of miralax BID prescribed at discharge. Improved but remains an issue without other red flags for infectious etiology. - Start imodium cautiously. - Hold miralax, continue to aim for daily bowel movements in light of fecal impaction which occurred at last hospitalization.   Stage II sacral pressure injury: POA - Offload as able  Moderate protein-calorie malnutrition:  - Dietitian consulted, supplement protein as able. MVM.  DVT prophylaxis: Lovenox 350mq24h Code Status: Full Family Communication: None at bedside  Disposition Plan: SNF once insurance authorization obtained. Medically stable. Anticipate DC 1/27.  Consultants:   Oncology  Procedures:   None  Antimicrobials:  Vancomycin, cefepime x1 1/23.   Subjective: Feels she's at her mental baseline, having loose stools a couple times per day and requests something for them. Denies abd pain, nausea, vomiting, fevers.   Objective: Vitals:   04/27/18 1500 04/27/18 1959 04/28/18 0533 04/28/18 1440  BP: 135/77 (!) 152/78 (!) 143/88 (!) 160/87  Pulse: 81 76 79 78  Resp: 18 16  (!) 27  Temp: 98.7 F (37.1 C) 98.1 F (36.7 C) (!) 97.5 F (36.4 C) 98 F (36.7 C)  TempSrc: Oral Oral Oral Oral  SpO2: 95% 95% 96% 99%  Weight:  Height:        Intake/Output Summary (Last 24 hours) at 04/28/2018 1855 Last data filed at 04/28/2018 1818 Gross per 24 hour  Intake 760 ml   Output -  Net 760 ml   Filed Weights   04/25/18 1800  Weight: 41.7 kg   Gen: 78 y.o. female in no distress Pulm: Nonlabored breathing room air. Clear. CV: Regular rate and rhythm. No murmur, rub, or gallop. No JVD, no dependent edema. GI: Abdomen soft, non-tender, non-distended, with normoactive bowel sounds.  Ext: Warm, no deformities Skin: Stable right breast mass. No new rashes, lesions or ulcers on visualized skin. Neuro: Alert and oriented. No focal neurological deficits. Psych: Judgement and insight appear fair. Mood euthymic & affect congruent. Behavior is appropriate.    Data Reviewed: I have personally reviewed following labs and imaging studies  CBC: Recent Labs  Lab 04/25/18 1309 04/26/18 0656  WBC 8.1 6.2  NEUTROABS  --  4.9  HGB 9.1* 9.1*  HCT 29.0* 29.0*  MCV 95.1 94.5  PLT 297 456   Basic Metabolic Panel: Recent Labs  Lab 04/25/18 1309 04/26/18 0656 04/27/18 0539  NA 138 138 138  K 2.7* 3.8 5.1  CL 97* 104 107  CO2 29 23 23   GLUCOSE 90 73 145*  BUN 14 24* 37*  CREATININE 0.75 0.81 0.84  CALCIUM 8.7* 8.3* 8.4*  MG 1.5*  --  2.6*  PHOS 3.0  --   --    GFR: Estimated Creatinine Clearance: 35.7 mL/min (by C-G formula based on SCr of 0.84 mg/dL). Liver Function Tests: Recent Labs  Lab 04/25/18 1309 04/26/18 0656  AST 51* 43*  ALT 29 23  ALKPHOS 318* 255*  BILITOT 0.6 0.6  PROT 6.3* 5.3*  ALBUMIN 3.0* 2.5*   Recent Labs  Lab 04/25/18 1309  LIPASE 155*   No results for input(s): AMMONIA in the last 168 hours. Coagulation Profile: No results for input(s): INR, PROTIME in the last 168 hours. Cardiac Enzymes: Recent Labs  Lab 04/25/18 1309 04/25/18 1851 04/26/18 0112 04/26/18 0656  TROPONINI 0.03* 0.04* 0.04* 0.03*   BNP (last 3 results) No results for input(s): PROBNP in the last 8760 hours. HbA1C: No results for input(s): HGBA1C in the last 72 hours. CBG: No results for input(s): GLUCAP in the last 168 hours. Lipid  Profile: No results for input(s): CHOL, HDL, LDLCALC, TRIG, CHOLHDL, LDLDIRECT in the last 72 hours. Thyroid Function Tests: No results for input(s): TSH, T4TOTAL, FREET4, T3FREE, THYROIDAB in the last 72 hours. Anemia Panel: No results for input(s): VITAMINB12, FOLATE, FERRITIN, TIBC, IRON, RETICCTPCT in the last 72 hours. Urine analysis:    Component Value Date/Time   COLORURINE STRAW (A) 04/25/2018 1655   APPEARANCEUR HAZY (A) 04/25/2018 1655   LABSPEC 1.009 04/25/2018 1655   PHURINE 8.0 04/25/2018 1655   GLUCOSEU NEGATIVE 04/25/2018 1655   HGBUR NEGATIVE 04/25/2018 1655   BILIRUBINUR NEGATIVE 04/25/2018 1655   BILIRUBINUR neg 01/02/2014 1034   KETONESUR 5 (A) 04/25/2018 1655   PROTEINUR NEGATIVE 04/25/2018 1655   UROBILINOGEN 0.2 01/02/2014 1034   NITRITE NEGATIVE 04/25/2018 1655   LEUKOCYTESUR NEGATIVE 04/25/2018 1655   Recent Results (from the past 240 hour(s))  Blood culture (routine x 2)     Status: None (Preliminary result)   Collection Time: 04/25/18 12:45 PM  Result Value Ref Range Status   Specimen Description   Final    BLOOD LEFT ANTECUBITAL Performed at [facility] [address] [place], [place] [address]  Special Requests   Final    BOTTLES DRAWN AEROBIC AND ANAEROBIC Blood Culture adequate volume Performed at [facility] [address] [place], [place] [address]    Culture   Final    NO GROWTH 3 DAYS Performed at [facility], [place] [address] [place], [place] [address]    Report Status PENDING  Incomplete  Blood culture (routine x 2)     Status: None (Preliminary result)   Collection Time: 04/25/18 12:45 PM  Result Value Ref Range Status   Specimen Description   Final    BLOOD LEFT HAND Performed at [facility] [address], [place], [place] [address]    Special Requests   Final    BOTTLES DRAWN AEROBIC AND ANAEROBIC Blood Culture results may not be optimal due to an inadequate  volume of blood received in culture bottles Performed at [facility] [address] [place], [place] [address]    Culture   Final    NO GROWTH 3 DAYS Performed at [facility], [place] [address], [place], [place] [address]    Report Status PENDING  Incomplete      Radiology Studies: No results found.  Scheduled Meds: . anastrozole  1 mg Oral Daily  . dexamethasone  4 mg Intravenous Q12H  . feeding supplement (ENSURE ENLIVE)  237 mL Oral BID BM  . multivitamin with minerals  1 tablet Oral Daily   Continuous Infusions:    LOS: 2 days   Time spent: 25 minutes.  [name], MD [facility] [email or web address] Password [facility] 04/28/2018, 6:55 PM

## 2018-04-29 NOTE — Progress Notes (Signed)
PROGRESS NOTE  Morgan Morrow  ANV:[id number] DOB: [birth year] DOA: 04/25/2018 PCP: Leighton Ruff, MD  Outpatient Specialists: To establish outpatient care with Dr. Jana Hakim, Oncology. Brief Narrative: Morgan Morrow is a 78 y.o. female with a history of HTN and recently diagnosed metastatic breast cancer. She was hospitalized 1/10 - 1/17 and started on anastrozole with plans for outpatient follow up with oncology, discharged to SNF where on 1/22 she was found to be confused, hardly speaking. This AMS continued in the ED where she was afebrile, tachypneic, mildly hypertensive. Urinalysis was negative. CXR showed stable metastatic disease without infiltrate. Empiric vancomycin and cefepime were given for sepsis. Potassium was low at 2.7, supplemented. CT head showed edematous areas in the medial left temporal and mid-right cerebellar regions, felt to favor metastatic foci over infection or infarct. Further neuroimaging was recommended, though patient's family deferred at that time. IV steroids were started and the patient was admitted. Mental status has improved. PT continues to recommend SNF due to significant weakness, though return to facility is delayed by insurance authorization. She is medically stable.  Assessment & Plan: Principal Problem:   Altered mental status Active Problems:   Essential hypertension   Metastatic breast cancer (HCC)   Anemia   Hypokalemia   Cerebral edema (HCC)  Acute encephalopathy due to cerebral edema, dural invasion of calvarial osseous metastasis: Also metabolic encephalopathy due to derangements. This is improved. - Continue IV steroids. Discussed with Dr. Jana Hakim who recommends continuing 32m po BID once discharged.  - Holding antibiotics in the absence of infectious nidus identified (no infiltrate, pyuria, leukocytosis, fever). Remains afebrile. Blood cultures NGTD x4d. - Stopped pharmaceutical VTE ppx given ?hemorrhage. REstarted  Metastatic breast CA:    - Continue anastrozole, having local response per oncology.  - Pt will follow up with Dr. MJana Hakimnext week. He is coordinating care with neuro-oncology, Dr. VMickeal Skinnerfor further recommendations.   HTN: - Restarted antihypertensives, BP at goal now.  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 No bleeding reported. - Monitor intermittently  Hypokalemia: Due to decreased po intake and GI losses.  - Resolved, GI losses less and po intake improved. Stopped supplementaion and IV fluids.   Diarrhea: In the setting of miralax BID prescribed at discharge. Improved but remains an issue without other red flags for infectious etiology. - Started imodium cautiously with improvement. - Hold miralax, continue to aim for daily bowel movements in light of fecal impaction which occurred at last hospitalization.   Stage II sacral pressure injury: POA - Offload as able  Moderate protein-calorie malnutrition:  - Dietitian consulted, supplement protein as able. MVM.  DVT prophylaxis: Lovenox 311mq24h Code Status: Full Family Communication: None at bedside  Disposition Plan: SNF once insurance authorization obtained. Medically stable. Anticipate DC 1/27.  Consultants:   Oncology  Procedures:   None  Antimicrobials:  Vancomycin, cefepime x1 1/23.   Subjective: No new events, diarrhea improved. No abd pain or fever.  Objective: Vitals:   04/28/18 2220 04/29/18 0501 04/29/18 0904 04/29/18 1422  BP: (!) 169/88 126/82 134/75 130/69  Pulse: 75 79 74 74  Resp: 20 14  20   Temp: 97.6 F (36.4 C) (!) 97.5 F (36.4 C)  98 F (36.7 C)  TempSrc: Oral Oral  Oral  SpO2: 97% 96% 100% 99%  Weight:      Height:        Intake/Output Summary (Last 24 hours) at 04/29/2018 1909 Last data filed at 04/29/2018 1300 Gross per 24 hour  Intake  720 ml  Output -  Net 720 ml   Filed Weights   04/25/18 1800  Weight: 41.7 kg   Gen: 78 y.o. female in no distress Pulm: Nonlabored breathing room air.  Clear. CV: Regular rate and rhythm. +systolic murmur, rub, or gallop. No JVD, no dependent edema. GI: Abdomen soft, non-tender, non-distended, with normoactive bowel sounds.  Ext: Warm, no deformities Skin: No new rashes, lesions or ulcers on visualized skin. Right breast not examined today. Neuro: Alert and oriented. No focal neurological deficits. Psych: Judgement and insight appear fair. Mood euthymic & affect congruent. Behavior is appropriate.    Data Reviewed: I have personally reviewed following labs and imaging studies  CBC: Recent Labs  Lab 04/25/18 1309 04/26/18 0656  WBC 8.1 6.2  NEUTROABS  --  4.9  HGB 9.1* 9.1*  HCT 29.0* 29.0*  MCV 95.1 94.5  PLT 297 867   Basic Metabolic Panel: Recent Labs  Lab 04/25/18 1309 04/26/18 0656 04/27/18 0539  NA 138 138 138  K 2.7* 3.8 5.1  CL 97* 104 107  CO2 29 23 23   GLUCOSE 90 73 145*  BUN 14 24* 37*  CREATININE 0.75 0.81 0.84  CALCIUM 8.7* 8.3* 8.4*  MG 1.5*  --  2.6*  PHOS 3.0  --   --    GFR: Estimated Creatinine Clearance: 35.7 mL/min (by C-G formula based on SCr of 0.84 mg/dL). Liver Function Tests: Recent Labs  Lab 04/25/18 1309 04/26/18 0656  AST 51* 43*  ALT 29 23  ALKPHOS 318* 255*  BILITOT 0.6 0.6  PROT 6.3* 5.3*  ALBUMIN 3.0* 2.5*   Recent Labs  Lab 04/25/18 1309  LIPASE 155*   No results for input(s): AMMONIA in the last 168 hours. Coagulation Profile: No results for input(s): INR, PROTIME in the last 168 hours. Cardiac Enzymes: Recent Labs  Lab 04/25/18 1309 04/25/18 1851 04/26/18 0112 04/26/18 0656  TROPONINI 0.03* 0.04* 0.04* 0.03*   BNP (last 3 results) No results for input(s): PROBNP in the last 8760 hours. HbA1C: No results for input(s): HGBA1C in the last 72 hours. CBG: No results for input(s): GLUCAP in the last 168 hours. Lipid Profile: No results for input(s): CHOL, HDL, LDLCALC, TRIG, CHOLHDL, LDLDIRECT in the last 72 hours. Thyroid Function Tests: No results for  input(s): TSH, T4TOTAL, FREET4, T3FREE, THYROIDAB in the last 72 hours. Anemia Panel: No results for input(s): VITAMINB12, FOLATE, FERRITIN, TIBC, IRON, RETICCTPCT in the last 72 hours. Urine analysis:    Component Value Date/Time   COLORURINE STRAW (A) 04/25/2018 1655   APPEARANCEUR HAZY (A) 04/25/2018 1655   LABSPEC 1.009 04/25/2018 1655   PHURINE 8.0 04/25/2018 1655   GLUCOSEU NEGATIVE 04/25/2018 1655   HGBUR NEGATIVE 04/25/2018 1655   BILIRUBINUR NEGATIVE 04/25/2018 1655   BILIRUBINUR neg 01/02/2014 1034   KETONESUR 5 (A) 04/25/2018 1655   PROTEINUR NEGATIVE 04/25/2018 1655   UROBILINOGEN 0.2 01/02/2014 1034   NITRITE NEGATIVE 04/25/2018 1655   LEUKOCYTESUR NEGATIVE 04/25/2018 1655   Recent Results (from the past 240 hour(s))  Blood culture (routine x 2)     Status: None (Preliminary result)   Collection Time: 04/25/18 12:45 PM  Result Value Ref Range Status   Specimen Description   Final    BLOOD LEFT ANTECUBITAL Performed at [facility] [address], [place], [place] [address]    Special Requests   Final    BOTTLES DRAWN AEROBIC AND ANAEROBIC Blood Culture adequate volume Performed at [facility], [address] [place], [place] [address]    Culture   Final    NO GROWTH 4 DAYS Performed at [facility], [place] [address] [place], [place] [address]    Report Status PENDING  Incomplete  Blood culture (routine x 2)     Status: None (Preliminary result)   Collection Time: 04/25/18 12:45 PM  Result Value Ref Range Status   Specimen Description   Final    BLOOD LEFT HAND Performed at [facility] [address], [place], [place] [address]    Special Requests   Final    BOTTLES DRAWN AEROBIC AND ANAEROBIC Blood Culture results may not be optimal due to an inadequate volume of blood received in culture bottles Performed at [facility] [address] [place], [place] [address]     Culture   Final    NO GROWTH 4 DAYS Performed at [facility], [place] [address], [place], [place] [address]    Report Status PENDING  Incomplete      Radiology Studies: No results found.  Scheduled Meds: . amLODipine  5 mg Oral Daily  . anastrozole  1 mg Oral Daily  . dexamethasone  4 mg Intravenous Q12H  . enoxaparin (LOVENOX) injection  30 mg Subcutaneous Q24H  . feeding supplement (ENSURE ENLIVE)  237 mL Oral BID BM  . lisinopril  20 mg Oral Daily  . multivitamin with minerals  1 tablet Oral Daily  . pantoprazole  40 mg Oral Daily   Continuous Infusions:    LOS: 3 days   Time spent: 25 minutes.  [name], MD [facility] [email or web address] Password [facility] 04/29/2018, 7:09 PM

## 2018-04-29 NOTE — Plan of Care (Signed)
  Problem: Health Behavior/Discharge Planning: Goal: Ability to manage health-related needs will improve Outcome: Progressing   Problem: Clinical Measurements: Goal: Ability to maintain clinical measurements within normal limits will improve Outcome: Progressing Goal: Will remain free from infection Outcome: Progressing Goal: Diagnostic test results will improve Outcome: Progressing Goal: Respiratory complications will improve Outcome: Progressing Goal: Cardiovascular complication will be avoided Outcome: Progressing   Problem: Nutrition: Goal: Adequate nutrition will be maintained Outcome: Progressing   Problem: Coping: Goal: Level of anxiety will decrease Outcome: Progressing   Problem: Elimination: Goal: Will not experience complications related to bowel motility Outcome: Progressing   Problem: Pain Managment: Goal: General experience of comfort will improve Outcome: Progressing   Problem: Safety: Goal: Ability to remain free from injury will improve Outcome: Progressing

## 2018-04-30 ENCOUNTER — Telehealth: Payer: Self-pay | Admitting: Oncology

## 2018-04-30 LAB — CULTURE, BLOOD (ROUTINE X 2)
Culture: NO GROWTH
Culture: NO GROWTH
Special Requests: ADEQUATE

## 2018-04-30 MED ORDER — DEXAMETHASONE 4 MG PO TABS: 4.0000 mg | ORAL_TABLET | Freq: Two times a day (BID) | ORAL | Status: DC

## 2018-04-30 MED ORDER — SIMETHICONE 80 MG PO CHEW: 160.0000 mg | CHEWABLE_TABLET | Freq: Four times a day (QID) | ORAL | 0 refills | Status: AC | PRN

## 2018-04-30 NOTE — Progress Notes (Signed)
Report called to [place] at [facility]. All questions answered. Pt aware of transfer to rehab and agreeable.

## 2018-04-30 NOTE — Clinical Social Work Placement (Signed)
   CLINICAL SOCIAL WORK PLACEMENT  NOTE  Date:  04/30/2018  Patient Details  Name: Morgan Morrow MRN: [id number] Date of Birth: 25-Aug-1940  Clinical Social Work is seeking post-discharge placement for this patient at the [place] level of care (*CSW will initial, date and re-position this form in  chart as items are completed):  Yes   Patient/family provided with [facility] Work Department's list of facilities offering this level of care within the geographic area requested by the patient (or if unable, by the patient's family).  Yes   Patient/family informed of their freedom to choose among providers that offer the needed level of care, that participate in Medicare, Medicaid or managed care program needed by the patient, have an available bed and are willing to accept the patient.  Yes   Patient/family informed of [facility]'s ownership interest in [facility] and [facility], as well as of the fact that they are under no obligation to receive care at these facilities.  PASRR submitted to EDS on       PASRR number received on       Existing PASRR number confirmed on 04/30/18     FL2 transmitted to all facilities in geographic area requested by pt/family on 04/30/18     FL2 transmitted to all facilities within larger geographic area on       Patient informed that his/her managed care company has contracts with or will negotiate with certain facilities, including the following:        Yes   Patient/family informed of bed offers received.  Patient chooses bed at [facility])     Physician recommends and patient chooses bed at      Patient to be transferred to [facility]) on 04/30/18.  Patient to be transferred to facility by ptar     Patient family notified on 04/30/18 of transfer.  Name of family member notified:  [place]     PHYSICIAN       Additional Comment:    _______________________________________________ Wende Neighbors, LCSW 04/30/2018, 11:25 AM

## 2018-04-30 NOTE — Telephone Encounter (Signed)
Scheduled appt per 1/24 sch message - pt is aware of appt of appt date and time

## 2018-04-30 NOTE — Progress Notes (Signed)
Clinical Social Worker facilitated patient discharge including contacting patient family and facility to confirm patient discharge plans.  Clinical information faxed to facility and family agreeable with plan.  CSW arranged ambulance transport via [facility] to [facility].  RN to call [phone] ([phone]#[id number]) for report prior to discharge.  Clinical Social Worker will sign off for now as social work intervention is no longer needed. Please consult [place] again if new need arises.  Rhea Pink, MSW, [facility]

## 2018-04-30 NOTE — Discharge Summary (Signed)
Physician Discharge Summary  Morgan Morrow UKG:[id number] DOB: 21-Nov-1940 DOA: 04/25/2018  PCP: Leighton Ruff, MD  Admit date: 04/25/2018 Discharge date: 04/30/2018  Admitted From: SNF Disposition: SNF   Recommendations for Outpatient Follow-up:  1. Follow up with PCP in 1-2 weeks 2. Follow up with oncology as scheduled Friday 3. Please obtain BMP/CBC in one week  Home Health: N/A Equipment/Devices: Per SNF Discharge Condition: Stable CODE STATUS: Full Diet recommendation: Heart healthy  Brief/Interim Summary: Morgan Morrow is a 78 y.o. female with a history of HTN and recently diagnosed metastatic breast cancer. She was hospitalized 1/10 - 1/17 and started on anastrozole with plans for outpatient follow up with oncology, discharged to SNF where on 1/22 she was found to be confused, hardly speaking. This AMS continued in the ED where she was afebrile, tachypneic, mildly hypertensive. Urinalysis was negative. CXR showed stable metastatic disease without infiltrate. Empiric vancomycin and cefepime were given for sepsis. Potassium was low at 2.7, supplemented. CT head showed edematous areas in the medial left temporal and mid-right cerebellar regions, felt to favor metastatic foci over infection or infarct. Further neuroimaging was recommended, though patient's family deferred at that time. IV steroids were started and the patient was admitted. Mental status has improved. PT continues to recommend SNF due to significant weakness, though return to facility is delayed by insurance authorization. She is medically stable.  Discharge Diagnoses:  Principal Problem:   Altered mental status Active Problems:   Essential hypertension   Metastatic breast cancer ([place])   Anemia   Hypokalemia   Cerebral edema (HCC)  Acute encephalopathy due to cerebral edema, dural invasion of calvarial osseous metastasis: Also metabolic encephalopathy due to derangements. This is improved. - Continued IV  steroids. Discussed with Dr. Jana Hakim who recommends continuing 1m po BID once discharged.  - Holding antibiotics in the absence of infectious nidus identified (no infiltrate, pyuria, leukocytosis, fever). Remains afebrile. Blood cultures NGTD x5d.  Metastatic breast CA:  - Continue anastrozole, having local response per oncology.  - Pt will follow up with Dr. MJana Hakimon Friday. He is coordinating care with neuro-oncology, Dr. VMickeal Skinnerfor further recommendations.   HTN: - Restarted antihypertensives, BP at goal now.  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 No bleeding reported. - Monitor intermittently  Hypokalemia: Due to decreased po intake and GI losses.  - Resolved, GI losses less and po intake improved. Stable off supplementaion and IV fluids.   Diarrhea: In the setting of miralax BID prescribed at discharge. Improved but remains an issue without other red flags for infectious etiology. - Started imodium cautiously with improvement. Still taking good po.  - Hold miralax, continue to aim for daily bowel movements in light of fecal impaction which occurred at last hospitalization.   Stage II sacral pressure injury: POA - Offload as able  Moderate protein-calorie malnutrition:  - Dietitian consulted, supplement protein as able. MVM.  Discharge Instructions Discharge Instructions    Diet - low sodium heart healthy   Complete by:  As directed      Allergies as of 04/30/2018   No Known Allergies     Medication List    STOP taking these medications   polyethylene glycol packet Commonly known as:  MIRALAX / GLYCOLAX     TAKE these medications   acetaminophen 500 MG tablet Commonly known as:  TYLENOL Take 500 mg by mouth every 6 (six) hours as needed for mild pain.   amLODipine 5 MG tablet Commonly known as:  NORVASC Take  5 mg by mouth daily.   anastrozole 1 MG tablet Commonly known as:  ARIMIDEX Take 1 tablet (1 mg total) by mouth daily for 30 days.    dexamethasone 4 MG tablet Commonly known as:  DECADRON Take 1 tablet (4 mg total) by mouth 2 (two) times daily with a meal.   feeding supplement (ENSURE ENLIVE) Liqd Take 237 mLs by mouth 2 (two) times daily between meals.   ibuprofen 200 MG tablet Commonly known as:  ADVIL,MOTRIN Take 200-400 mg by mouth every 6 (six) hours as needed for mild pain.   lisinopril 20 MG tablet Commonly known as:  PRINIVIL,ZESTRIL Take 20 mg by mouth daily.   pantoprazole 40 MG tablet Commonly known as:  PROTONIX Take 1 tablet (40 mg total) by mouth daily.   simethicone 80 MG chewable tablet Commonly known as:  MYLICON Chew 2 tablets (160 mg total) by mouth 4 (four) times daily as needed for flatulence. What changed:    when to take this  reasons to take this      Follow-up Information    Leighton Ruff, MD. Schedule an appointment as soon as possible for a visit in 1 week(s).   Specialty:  Family Medicine Contact information: [place] [address] [phone]        [name], [name], MD Follow up on 05/04/2018.   Specialty:  Oncology Contact information: [place] [address] [phone]          No Known Allergies  Consultations:  Oncology, Dr. Jana Hakim  Procedures/Studies: Dg Chest 2 View  Result Date: 04/13/2018 CLINICAL DATA:  Per EMS, patient from home, c/o diarrhea worsening x2 days after taking a laxative. Denies N/V and abdominal pain. Weakness, short of breath, never a smoker, no other chest complaints EXAM: CHEST - 2 VIEW COMPARISON:  11/02/2010 FINDINGS: Patchy airspace opacities in the right middle lobe, new since previous. Left lung clear. Heart size and mediastinal contours are within normal limits. No effusion. Visualized bones unremarkable. IMPRESSION: Patchy right middle lobe airspace disease suggesting pneumonia. Electronically Signed   By: Lucrezia Europe M.D.   On: 04/13/2018 17:33   Ct Head Wo  Contrast  Result Date: 04/25/2018 CLINICAL DATA:  Altered mental status.  Metastatic breast carcinoma EXAM: CT HEAD WITHOUT CONTRAST TECHNIQUE: Contiguous axial images were obtained from the base of the skull through the vertex without intravenous contrast. COMPARISON:  April 14, 2018 FINDINGS: Brain: There is mild diffuse atrophy. There is apparent edema in the medial right temporal lobe, concerning for potential site of mass. This area on noncontrast enhanced CT measures 1.9 x 1.7 cm. There is decreased attenuation in the mid right cerebellum which is somewhat ill-defined. This area may well represent foci of vasogenic edema. There is no hemorrhage, extra-axial fluid collection, or midline shift. There is small vessel disease throughout the centra semiovale bilaterally, stable from recent prior study. There is evidence of an apparent prior infarct in the right lateral thalamus. There is no acute infarct apparent. Vascular: No hyperdense vessel. There are foci of vascular calcification in the left vertebral artery as well as in both carotid siphon regions. 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 These changes are greatest in the left frontal and posterior parietal regions. Sinuses/Orbits: There is mucosal thickening in several ethmoid air cells. Other visualized paranasal sinuses are clear. Orbits appear symmetric bilaterally. Other: Mastoid air cells are clear. IMPRESSION: 1. Areas concerning  for edema in the medial left temporal and mid right cerebellar regions. Potential neoplastic foci in these areas must be of concern. Contrast enhanced CT or MR could be helpful for further evaluation of these areas. It should be noted that solely from an imaging standpoint on noncontrast enhanced CT, edema of this nature could also be indicative of a nonneoplastic lesion such as abscess. Infarct in these areas is also possible, but the distribution of abnormal  attenuation is more suggestive of a lesion other than infarct. 2. There is no midline shift or hemorrhage. No extra-axial fluid collections. 3. 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 4. There is mild atrophy with extensive supratentorial small vessel disease. Prior small apparent infarct in the lateral right thalamus. No acute appearing infarct is demonstrable currently. 5.  There are foci of arterial vascular calcification. 6.  Mucosal thickening noted in several ethmoid air cells. Electronically Signed   By: Lowella Grip III M.D.   On: 04/25/2018 13:36   Ct Head W & Wo Contrast  Result Date: 04/14/2018 CLINICAL DATA:  Progressive weakness and weight loss for several months. Diarrhea. Liver and bone metastases. Unknown primary. Bilateral pulmonary nodules. EXAM: CT HEAD WITHOUT AND WITH CONTRAST TECHNIQUE: Contiguous axial images were obtained from the base of the skull through the vertex without and with intravenous contrast CONTRAST:  66m OMNIPAQUE IOHEXOL 300 MG/ML  SOLN COMPARISON:  None. FINDINGS: Brain: Mild atrophy and moderate diffuse white matter disease is present. There is no pathologic enhancement to suggest metastatic disease to the brain. The ventricles are of proportionate to the degree of atrophy. Remote lacunar infarcts are present in the basal ganglia bilaterally. Brainstem and cerebellum are normal. Vascular: Atherosclerotic calcifications are present within the cavernous internal carotid arteries and at the dural margin the left vertebral artery. There is no hyperdense vessel. Skull: Scattered lytic lesions present throughout the calvarium compatible with known bone metastases. No expansile lesions are present. An indeterminate scalp soft tissue lesion over the left parietal skull near the vertex measures 2.5 x 2.4 x 0.6 cm. There is diffuse infiltration of the calvarium subjacent to this area.  Sinuses/Orbits: The paranasal sinuses and mastoid air cells are clear. The globes and orbits are within normal limits. IMPRESSION: 1. No evidence for metastatic disease to brain. 2. Extensive osseous metastases throughout the skull. 3. Focal soft tissue in the high left parietal scalp. This could represent a metastatic deposit. Electronically Signed   By: CSan MorelleM.D.   On: 04/14/2018 12:45   Ct Chest W Contrast  Result Date: 04/14/2018 CLINICAL DATA:  Right middle lobe pneumonia. EXAM: CT CHEST WITH CONTRAST TECHNIQUE: Multidetector CT imaging of the chest was performed during intravenous contrast administration. CONTRAST:  715mOMNIPAQUE IOHEXOL 300 MG/ML  SOLN COMPARISON:  Chest x-ray 04/13/2018 FINDINGS: Cardiovascular: Heart size upper normal. No substantial pericardial effusion. Coronary artery calcification is evident. Atherosclerotic calcification is noted in the wall of the thoracic aorta. Ascending thoracic aorta measures 3.8 cm diameter. Mediastinum/Nodes: Soft tissue attenuation is identified in the AP window without a discrete lymph node. Small prevascular lymph nodes are so seated with small lymph nodes in each hilar region. Circumferential wall thickening is noted in the distal esophagus and fluid within the esophageal lumen is compatible with reflux or dysmotility. There is no axillary lymphadenopathy. Lungs/Pleura: The central tracheobronchial airways are patent. Numerous ill-defined pulmonary nodules are scattered through both lungs. Index nodule medial right  upper lobe (45/11) measures 5 mm. Index right lower lobe nodule seen posteriorly on 102/11 measures 8 mm. Representative peripheral left lower lobe nodule (83/11) measures 6 mm. Small bilateral pleural effusions evident. Upper Abdomen: 11 mm low-density lesion in the liver, better characterized on yesterday's abdomen CT. Additional small low-density lesions in the liver. Calcified gallstones evident. Small cyst noted right  kidney. Musculoskeletal: Widespread bony metastatic involvement is evident. 7.7 x 6.8 x 2.0 cm mass identified in the right breast. IMPRESSION: 1. No evidence for right middle lobe pneumonia. Opacity seen over the right breast on recent chest x-ray likely represents the patient's known large right breast mass. 2. Numerous ill-defined pulmonary nodules scattered through both lungs, highly suspicious for metastatic disease. 3. Widespread bony metastatic involvement. 4. Hypodense liver lesions concerning for metastatic involvement. 5. Small bilateral pleural effusions. 6.  Aortic Atherosclerois (ICD10-170.0) Electronically Signed   By: Misty Stanley M.D.   On: 04/14/2018 13:52   Mr Jeri Cos KK Contrast  Result Date: 04/26/2018 CLINICAL DATA:  78 y/o  F; metastatic breast cancer. EXAM: MRI HEAD WITHOUT AND WITH CONTRAST TECHNIQUE: Multiplanar, multiecho pulse sequences of the brain and surrounding structures were obtained without and with intravenous contrast. CONTRAST:  4 cc Gadavist COMPARISON:  04/25/2018 CT head. FINDINGS: Brain: Right posterolateral frontal region irregular dural thickening contiguous with a calvarial metastasis indicating dural invasion (series 10, image 115). No abnormal enhancement of the brain parenchyma. No reduced diffusion to suggest acute or early subacute infarction. Punctate foci of susceptibility hypointensity within left lentiform nucleus and right frontal periventricular white matter compatible hemosiderin deposition of chronic microhemorrhage. No extra-axial collection, hydrocephalus, or herniation. Confluent nonspecific T2 FLAIR hyperintensities in subcortical and periventricular white matter as well as the pons are compatible with moderate chronic microvascular ischemic changes for age. Moderate volume loss of the brain. Vascular: Normal flow voids. Skull and upper cervical spine: Numerous low T1 signal foci within the calvarium with diffusion hyperintensity enhancement  compatible with osseous metastatic disease. The lesion in the left paramedian parietal region is mildly expansile into the scalp. Sinuses/Orbits: Negative. Other: None. IMPRESSION: 1. Multiple calvarial metastasis. Small area of irregular dural thickening associated with a calvarial metastasis in right frontal region compatible with dural invasion. 2. No brain parenchymal metastasis identified. 3. Moderate chronic microvascular ischemic changes and volume loss of the brain. Electronically Signed   By: Kristine Garbe M.D.   On: 04/26/2018 16:10   Ct Abdomen Pelvis W Contrast  Result Date: 04/13/2018 CLINICAL DATA:  Worsening diarrhea x2 days after taking laxatives. EXAM: CT ABDOMEN AND PELVIS WITH CONTRAST TECHNIQUE: Multidetector CT imaging of the abdomen and pelvis was performed using the standard protocol following bolus administration of intravenous contrast. CONTRAST:  24m ISOVUE-300 IOPAMIDOL (ISOVUE-300) INJECTION 61%, 342mOMNIPAQUE IOHEXOL 300 MG/ML SOLN COMPARISON:  None. FINDINGS: Lower chest: Small hiatal hernia. Mild thickening of what may represent the distal esophagus raises possibility of esophagitis versus partial volume averaging of the uppermost aspect of the hiatal hernia. Top normal heart size without pericardial effusion. Subpleural ill-defined pulmonary opacities that may represent postinfectious or inflammatory change versus atelectasis are identified. Some however appear more nodular raising concern for pulmonary nodules, example a 3 mm right lower lobe nodule on series 7/13 with smaller nodular densities seen in the right lower lobe. Given abnormality of the axial and appendicular skeleton concerning for diffuse osseous metastasis or myeloma, these nodular opacities raise concern for pulmonary metastasis as well. Hepatobiliary: Ill-defined hypodensities in the right hepatic lobe are  identified, the largest measuring up to 14 mm with peripheral puddling of contrast suggestive  of a hemangioma is noted. Additional 11 mm hypodensity in the right hepatic lobe also appears to demonstrate similar enhancement pattern on repeat delayed imaging and would be more in keeping with a hemangioma. Further caudad in the right hepatic lobe is an ill-defined 12 mm hypodensity that appears to have filled in on repeat delayed imaging. Given pulmonary and osseous findings however, the possibility of hepatic metastasis is not excluded. MRI with liver protocol may help for further correlation. No biliary dilatation. Numerous gallstones are seen within the gallbladder. Pancreas: Normal Spleen: Normal size spleen with ill-defined hypodensity medially measuring 12 mm, nonspecific in etiology. Adrenals/Urinary Tract: Normal bilateral adrenal glands and kidneys. No obstructive uropathy. Urinary bladder is physiologically distended without focal mural thickening or calculus. Stomach/Bowel: Large stool ball in the rectum with circumferential anorectal thickening about this finding raises concern for stercoral proctocolitis. No bowel obstruction. The stomach and small intestine are nonacute. No evidence of acute appendicitis. Vascular/Lymphatic: Mild aortoiliac and branch vessel atherosclerosis without aneurysm. No adenopathy. Reproductive: Uterus and bilateral adnexa are unremarkable. Other: Mild soft tissue anasarca.  No ascites or free air. 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 Differential possibilities may include myeloma or diffuse osteo lytic metastasis. Moderate compression deformity of L1 with 50% height loss is noted as well as mild superior endplate compression of T11. No retropulsion is noted. IMPRESSION: 1. Ill-defined pulmonary opacities at the lung bases some which are nodular in appearance raising concern for possible metastatic disease. Concomitant in postinfectious or inflammatory abnormalities are not excluded. 2. Extensive osteolytic  abnormality of the included axial and appendicular skeleton with age indeterminate moderate L1 and mild T11 superior endplate compressions. Differential possibilities may include osteolytic metastasis of unknown primary or myeloma among some considerations. 3. Three hypodense lesions of the liver that appear to demonstrate peripheral puddling of contrast more likely to represent hemangiomata. However given the pulmonary and osseous findings, metastatic disease is still within differential considerations. 4. Anal rectal circumferential mural thickening surrounding a large stool ball. Stercoral proctocolitis is raised. These results were called by telephone at the time of interpretation on 04/13/2018 at 7:21 pm to Dr. Francine Graven , who verbally acknowledged these results. Electronically Signed   By: Ashley Royalty M.D.   On: 04/13/2018 19:21   Dg Chest Port 1 View  Result Date: 04/25/2018 CLINICAL DATA:  Weakness and altered mental status. Metastatic breast cancer. EXAM: PORTABLE CHEST 1 VIEW COMPARISON:  Chest CT 04/14/2018 and chest x-ray 04/13/2018 FINDINGS: The cardiac silhouette, mediastinal and hilar contours are stable. Moderate tortuosity of the thoracic aorta. Density over the right lower chest is likely due to the patient's breast mass. Scattered pulmonary lesions consistent with known pulmonary metastasis although much better seen on the chest CT. Do not see any definite acute overlying pulmonary process. Diffuse osseous metastatic disease is again noted. IMPRESSION: 1. Stable pulmonary and osseous metastatic disease. 2. No definite acute overlying pulmonary process. Electronically Signed   By: Marijo Sanes M.D.   On: 04/25/2018 12:11   Dg Swallowing Func-speech Pathology  Result Date: 04/15/2018 Objective Swallowing Evaluation: Type of Study: MBS-Modified Barium Swallow Study  Patient Details Name: DEBORA STOCKDALE MRN: [id number] Date of Birth: May 24, 1940 Today's Date: 04/15/2018 Time: SLP Start Time  (ACUTE ONLY): 1335 -SLP Stop Time (ACUTE ONLY): 1350 SLP Time Calculation (min) (ACUTE ONLY): 15 min Past Medical History: Past Medical History: Diagnosis Date .  Allergy  . Hypertension  Past Surgical History: No past surgical history on file. HPI: 78 yo patient referred for MBS due to pt having dysphagia.  Pt admitted with cachexia, stool burden, breast and scalp mass - likely metastatic breast cancer.  She underwent clinical evaluation of swallow yesterday and was placed on honey thick liquids and regular foods.  MBS indicated.  Reports voice became hoarse last April - denies significant issues with reflux.   Subjective: The patient was seen sitting upright in chair Assessment / Plan / Recommendation CHL IP CLINICAL IMPRESSIONS 04/15/2018 Clinical Impression Patient presents with mild pharyngeal dysphagia with decreased timing and adequacy of laryngeal elevation/closure causing minimal aspiration of thin with head neutral.  Aspiration was silent when trace but audible with cough response when mild. Chin tuck posture with pt fully upright and small boluses, airway protection intact.   Pharyngeal swallow is strong without significant residuals with thin, nectar, cracker - Limited amount of barium pt was provided due to pt's bowel issue.  Recommend advance diet to regular/thin with chin tuck posture.  Educated pt to aspiration precautions and mitigation strategies using teach back.   SLP Visit Diagnosis Dysphagia, pharyngeal phase (R13.13) Attention and concentration deficit following -- Frontal lobe and executive function deficit following -- Impact on safety and function Mild aspiration risk   CHL IP TREATMENT RECOMMENDATION 04/15/2018 Treatment Recommendations Therapy as outlined in treatment plan below   Prognosis 04/15/2018 Prognosis for Safe Diet Advancement Fair Barriers to Reach Goals Other (Comment) Barriers/Prognosis Comment -- CHL IP DIET RECOMMENDATION 04/15/2018 SLP Diet Recommendations Regular solids;Thin  liquid Liquid Administration via Straw Medication Administration Whole meds with puree Compensations Slow rate;Small sips/bites;Chin tuck Postural Changes Remain semi-upright after after feeds/meals (Comment);Seated upright at 90 degrees   CHL IP OTHER RECOMMENDATIONS 04/15/2018 Recommended Consults Other (Comment) Oral Care Recommendations Oral care QID Other Recommendations --   CHL IP FOLLOW UP RECOMMENDATIONS 04/14/2018 Follow up Recommendations Other (comment)   CHL IP FREQUENCY AND DURATION 04/15/2018 Speech Therapy Frequency (ACUTE ONLY) min 2x/week Treatment Duration 2 weeks      CHL IP ORAL PHASE 04/15/2018 Oral Phase Impaired Oral - Pudding Teaspoon -- Oral - Pudding Cup -- Oral - Honey Teaspoon -- Oral - Honey Cup -- Oral - Nectar Teaspoon -- Oral - Nectar Cup WFL Oral - Nectar Straw -- Oral - Thin Teaspoon WFL Oral - Thin Cup WFL Oral - Thin Straw WFL Oral - Puree NT Oral - Mech Soft -- Oral - Regular WFL Oral - Multi-Consistency -- Oral - Pill -- Oral Phase - Comment --  CHL IP PHARYNGEAL PHASE 04/15/2018 Pharyngeal Phase Impaired Pharyngeal- Pudding Teaspoon -- Pharyngeal -- Pharyngeal- Pudding Cup -- Pharyngeal -- Pharyngeal- Honey Teaspoon -- Pharyngeal -- Pharyngeal- Honey Cup -- Pharyngeal -- Pharyngeal- Nectar Teaspoon -- Pharyngeal -- Pharyngeal- Nectar Cup Penetration/Aspiration during swallow;WFL Pharyngeal -- Pharyngeal- Nectar Straw -- Pharyngeal -- Pharyngeal- Thin Teaspoon WFL Pharyngeal -- Pharyngeal- Thin Cup Reduced airway/laryngeal closure;Penetration/Aspiration during swallow Pharyngeal Material enters airway, passes BELOW cords without attempt by patient to eject out (silent aspiration) Pharyngeal- Thin Straw Penetration/Apiration after swallow Pharyngeal Material enters airway, passes BELOW cords without attempt by patient to eject out (silent aspiration);Material enters airway, passes BELOW cords and not ejected out despite cough attempt by patient Pharyngeal- Puree NT Pharyngeal --  Pharyngeal- Mechanical Soft -- Pharyngeal -- Pharyngeal- Regular WFL Pharyngeal -- Pharyngeal- Multi-consistency -- Pharyngeal -- Pharyngeal- Pill -- Pharyngeal -- Pharyngeal Comment chin tuck helpful to prevent aspiration - trace penetration only which  cleared  CHL IP CERVICAL ESOPHAGEAL PHASE 04/15/2018 Cervical Esophageal Phase WFL Pudding Teaspoon -- Pudding Cup -- Honey Teaspoon -- Honey Cup -- Nectar Teaspoon -- Nectar Cup -- Nectar Straw -- Thin Teaspoon -- Thin Cup -- Thin Straw -- Puree -- Mechanical Soft -- Regular -- Multi-consistency -- Pill -- Cervical Esophageal Comment -- Macario Golds 04/15/2018, 2:18 PM    Luanna Salk, MS [facility] SLP Acute Rehab Services Pager [phone] Office [place] [facility] Chest & Delayed Img Double Cm  Result Date: 04/16/2018 CLINICAL DATA:  78 year old female with dysphagia nausea and vomiting. Metastatic breast cancer. Subsequent encounter. EXAM: ESOPHOGRAM/BARIUM SWALLOW TECHNIQUE: Single contrast examination was performed using  thin barium. FLUOROSCOPY TIME:  Fluoroscopy Time:  1 minutes and 6 seconds Radiation Exposure Index: 6.5 mGy COMPARISON:  04/15/2018 speech swallow.  04/13/2018 chest CT. FINDINGS: Exam was tailored to the patient. Single contrast exam performed with thin barium. No laryngeal aspiration occurred with chin tuck position. Slightly blunted primary esophageal stripping wave. No esophageal obstructing lesion. Mild smooth narrowing of the distal esophagus just above small hiatal hernia. 2 cm proximal to this level, there is a small circumferential esophageal stricture. The esophagus just above this level has a slightly lobulated appearance left lateral aspect. Cause of strictures indeterminate. Retained secretions limited evaluation esophageal mucosa. Patient attempted but was not able to swallow barium tablet. IMPRESSION: 1. Exam was tailored to the patient. 2. No laryngeal aspiration occurred with chin tuck  position. 3. Slightly blunted primary esophageal stripping wave. 4. No esophageal obstructing lesion. 5. Mild smooth narrowing of the distal esophagus just above small hiatal hernia. 2 cm proximal to this level, there is a small circumferential esophageal stricture. The esophagus just above this stricture has a slightly lobulated appearance along left lateral aspect. Cause of stricture is indeterminate. 6. Retained secretions limited evaluation of esophageal mucosa. 7. Patient attempted but was not able to swallow barium tablet (poor oropharyngeal phase). Electronically Signed   By: Genia Del M.D.   On: 04/16/2018 10:14     Subjective: Feels well, had some loose stool but eating well. No abd pain. Mentating at baseline.  Discharge Exam: Vitals:   04/30/18 0554 04/30/18 0959  BP: (!) 146/70 (!) 139/59  Pulse: 71 71  Resp: 14 16  Temp: (!) 97.4 F (36.3 C) 97.7 F (36.5 C)  SpO2: 98% 99%   General: Pt is alert, awake, not in acute distress Cardiovascular: RRR, S1/S2 +, no rubs, no gallops Respiratory: CTA bilaterally, no wheezing, no rhonchi Abdominal: Soft, NT, ND, bowel sounds + Extremities: No edema, no cyanosis  Labs: BNP (last 3 results) No results for input(s): BNP in the last 8760 hours. Basic Metabolic Panel: Recent Labs  Lab 04/25/18 1309 04/26/18 0656 04/27/18 0539  NA 138 138 138  K 2.7* 3.8 5.1  CL 97* 104 107  CO2 29 23 23   GLUCOSE 90 73 145*  BUN 14 24* 37*  CREATININE 0.75 0.81 0.84  CALCIUM 8.7* 8.3* 8.4*  MG 1.5*  --  2.6*  PHOS 3.0  --   --    Liver Function Tests: Recent Labs  Lab 04/25/18 1309 04/26/18 0656  AST 51* 43*  ALT 29 23  ALKPHOS 318* 255*  BILITOT 0.6 0.6  PROT 6.3* 5.3*  ALBUMIN 3.0* 2.5*   Recent Labs  Lab 04/25/18 1309  LIPASE 155*   No results for input(s): AMMONIA in the last 168 hours.  CBC: Recent Labs  Lab 04/25/18 1309 04/26/18 0656  WBC 8.1 6.2  NEUTROABS  --  4.9  HGB 9.1* 9.1*  HCT 29.0* 29.0*  MCV 95.1  94.5  PLT 297 328   Cardiac Enzymes: Recent Labs  Lab 04/25/18 1309 04/25/18 1851 04/26/18 0112 04/26/18 0656  TROPONINI 0.03* 0.04* 0.04* 0.03*   BNP: Invalid input(s): POCBNP CBG: No results for input(s): GLUCAP in the last 168 hours. D-Dimer No results for input(s): DDIMER in the last 72 hours. Hgb A1c No results for input(s): HGBA1C in the last 72 hours. Lipid Profile No results for input(s): CHOL, HDL, LDLCALC, TRIG, CHOLHDL, LDLDIRECT in the last 72 hours. Thyroid function studies No results for input(s): TSH, T4TOTAL, T3FREE, THYROIDAB in the last 72 hours.  Invalid input(s): FREET3 Anemia work up No results for input(s): VITAMINB12, FOLATE, FERRITIN, TIBC, IRON, RETICCTPCT in the last 72 hours. Urinalysis    Component Value Date/Time   COLORURINE STRAW (A) 04/25/2018 1655   APPEARANCEUR HAZY (A) 04/25/2018 1655   LABSPEC 1.009 04/25/2018 1655   PHURINE 8.0 04/25/2018 1655   GLUCOSEU NEGATIVE 04/25/2018 1655   HGBUR NEGATIVE 04/25/2018 1655   BILIRUBINUR NEGATIVE 04/25/2018 1655   BILIRUBINUR neg 01/02/2014 1034   KETONESUR 5 (A) 04/25/2018 1655   PROTEINUR NEGATIVE 04/25/2018 1655   UROBILINOGEN 0.2 01/02/2014 1034   NITRITE NEGATIVE 04/25/2018 1655   LEUKOCYTESUR NEGATIVE 04/25/2018 1655    Microbiology Recent Results (from the past 240 hour(s))  Blood culture (routine x 2)     Status: None (Preliminary result)   Collection Time: 04/25/18 12:45 PM  Result Value Ref Range Status   Specimen Description   Final    BLOOD LEFT ANTECUBITAL Performed at [facility], [place] [address], [place], [place] [address]    Special Requests   Final    BOTTLES DRAWN AEROBIC AND ANAEROBIC Blood Culture adequate volume Performed at [facility] [address], [place], [place] [address]    Culture   Final    NO GROWTH 4 DAYS Performed at [facility], [place] [address], [place], [place] [address]    Report Status PENDING   Incomplete  Blood culture (routine x 2)     Status: None (Preliminary result)   Collection Time: 04/25/18 12:45 PM  Result Value Ref Range Status   Specimen Description   Final    BLOOD LEFT HAND Performed at [facility] [address] [place], [place] [address]    Special Requests   Final    BOTTLES DRAWN AEROBIC AND ANAEROBIC Blood Culture results may not be optimal due to an inadequate volume of blood received in culture bottles Performed at [facility] [address] [place], [place] [address]    Culture   Final    NO GROWTH 4 DAYS Performed at [facility], [place] [address] [place],  [address]    Report Status PENDING  Incomplete    Time coordinating discharge: Approximately 40 minutes  [name], MD  [facility] 04/30/2018, 10:16 AM Pager [phone]

## 2018-04-30 NOTE — Care Management Important Message (Signed)
Important Message  Patient Details  Name: Morgan Morrow MRN: [id number] Date of Birth: [birth year]   Medicare Important Message Given:  Yes    Kerin Salen 04/30/2018, 1:12 PMImportant Message  Patient Details  Name: Morgan Morrow MRN: [id number] Date of Birth: 04-27-1940   Medicare Important Message Given:  Yes    Kerin Salen 04/30/2018, 1:12 PM

## 2018-04-30 NOTE — Plan of Care (Signed)
  Problem: Clinical Measurements: Goal: Diagnostic test results will improve Outcome: Adequate for Discharge   Problem: Nutrition: Goal: Adequate nutrition will be maintained Outcome: Adequate for Discharge

## 2018-05-02 ENCOUNTER — Other Ambulatory Visit: Payer: Self-pay

## 2018-05-02 DIAGNOSIS — C50919 Malignant neoplasm of unspecified site of unspecified female breast: Secondary | ICD-10-CM

## 2018-05-03 NOTE — Progress Notes (Signed)
[place]  [phone] [phone]     ID: Morgan Morrow DOB: [birth year]  MR#: [id number]  GQB#:[id number]  Patient Care Team: Morgan Ruff, MD as PCP - General (Family Medicine) [name], [name], MD as Consulting Physician (Oncology) OTHER MD:   CHIEF COMPLAINT:  Stage IV breast cancer  CURRENT TREATMENT: anastrozole, palbociclib; to start denosumab/Xgeva   INTERVAL HISTORY: Morgan Morrow is here today for follow-up and treatment of her stage IV breast cancer. She is accompanied by her daughter.  A second daughter participated by phone.  Morgan Morrow continues on anastrozole. She is not having hot flashes. She has some mild vaginal dryness, but this is unchanged from her normal.    REVIEW OF SYSTEMS: Morgan Morrow currently lIves at [facility]. She has occupational and physical therapy while there. She completes about 30 minutes each day. She walks a little with the help of a walker. She has not fallen. She eats some extra snacks and drinks Ensure to try and get her weight up. She has some pain, mostly in her tail bone. She puts some cream and takes Tylenol to treat, with relief. The daughter talked to a Education officer, museum about getting home help once Breniya returns home. She is not drinking a lot of water during the day. The patient denies unusual headaches, visual changes, nausea, vomiting, stiff neck, dizziness, or gait imbalance. There has been no cough, phlegm production, or pleurisy, no chest pain or pressure, and no change in bowel or bladder habits. The patient denies fever, rash, bleeding, unexplained fatigue or unexplained weight loss. A detailed review of systems was otherwise entirely negative.    HISTORY OF CURRENT ILLNESS: From the original consult note:  Morgan Morrow has had progressive weakness and weight loss for several months.  More recently she developed persistent diarrhea which brought her to the emergency room 04/13/2018.  Chest x-ray was  obtained suggesting a possible right middle lobe pneumonia.  CT of the abdomen and pelvis was obtained and it showed possible lung metastases, possible liver metastases, and multiple lytic bone lesions.  I was consulted for further evaluation.  I met with the patient in her room and in course of exam it became obvious that she had a right-sided breast cancer.      Morgan Morrow tells me she has not had a mammogram for over 10 years.  For the past 5 years or so she has had changes in her right breast which she has concealed from her family and apparently also from her physicians.  The patient's subsequent history is as detailed below.    PAST MEDICAL HISTORY: Past Medical History:  Diagnosis Date  . Allergy   . Hypertension      PAST SURGICAL HISTORY: Past Surgical History:  Procedure Laterality Date  . BREAST BIOPSY Right 04/16/2018   Procedure: RIGHT BREAST BIOPSY AND RIGHT POSTERIOR  SCALP BIOPSY AND TOP SCALP BIOPSY;  Surgeon: Morgan Hausen, MD;  Location: [facility];  Service: General;  Laterality: Right;  . NO PAST SURGERIES       FAMILY HISTORY: Family History  Problem Relation Age of Onset  . Hypertension Morrow   . Depression Morrow   . Hypertension Morrow   . Arthritis Morrow    Morgan Morrow died from a stroke at age 49. Morgan Morrow died at age 48. The patient had 1 brother and 2 sisters. Patient denies anyone in her family having breast, ovarian, prostate, or pancreatic cancer.    GYNECOLOGIC HISTORY:  No LMP recorded. Patient is postmenopausal. 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  She is divorced lives by herself, with no pets.  Her daughter Morgan Morrow lives in [place] where she works as Mudlogger of the contract section of the [employer]  of revenue.  The patient's son Morgan Morrow lives in [place] and works as a Geophysicist/field seismologist.  The patient has a daughter in [place] and a daughter in [place].  She has 5 grandchildren and 5 great-grandchildren.  She does not belong to a church, Glass blower/designer or mosque.                          ADVANCED DIRECTIVES: Not in place.  We will try to get a healthcare power of attorney accomplished this admission   HEALTH MAINTENANCE: Social History   Tobacco Use  . Smoking status: Never Smoker  . Smokeless tobacco: Never Used  Substance Use Topics  . Alcohol use: No  . Drug use: No    Colonoscopy: age 75  PAP:   Bone density:    No Known Allergies  Current Outpatient Medications  Medication Sig Dispense Refill  . acetaminophen (TYLENOL) 500 MG tablet Take 500 mg by mouth every 6 (six) hours as needed for mild pain.    Mar[place] Kitchen amLODipine (NORVASC) 5 MG tablet Take 5 mg by mouth daily.    Marland Kitchen anastrozole (ARIMIDEX) 1 MG tablet Take 1 tablet (1 mg total) by mouth daily for 30 days. 30 tablet 0  . dexamethasone (DECADRON) 4 MG tablet Take 1 tablet (4 mg total) by mouth 2 (two) times daily with a meal.    . feeding supplement, ENSURE ENLIVE, (ENSURE ENLIVE) LIQD Take 237 mLs by mouth 2 (two) times daily between meals. 237 mL 12  . ibuprofen (ADVIL,MOTRIN) 200 MG tablet Take 200-400 mg by mouth every 6 (six) hours as needed for mild pain.     Marland Kitchen lisinopril (PRINIVIL,ZESTRIL) 20 MG tablet Take 20 mg by mouth daily.    . palbociclib (IBRANCE) 125 MG capsule Take 1 capsule (125 mg total) by mouth daily with breakfast. Take whole with food. Take for 21 days on, 7 days off, repeat every 28 days. 21 capsule 6  . pantoprazole (PROTONIX) 40 MG tablet Take 1 tablet (40 mg total) by mouth daily. 30 tablet 1  . simethicone (MYLICON) 80 MG chewable tablet Chew 2 tablets (160 mg total) by mouth 4 (four) times daily as needed for flatulence. 30 tablet 0   No current facility-administered medications for this  visit.      OBJECTIVE: Middle-aged white woman examined in a wheelchair  Vitals:   05/04/18 1405  BP: 134/80  Pulse: (!) 107  Resp: 18  Temp: 97.8 F (36.6 C)  SpO2: 100%     Body mass index is 15.76 kg/m.   Wt Readings from Last 3 Encounters:  05/04/18 80 lb 11.2 oz (36.6 kg)  04/25/18 91 lb 14.9 oz (41.7 kg)  04/13/18 100 lb (45.4 kg)      ECOG FS:2 - Symptomatic, <50% confined to bed  Ocular: Sclerae unicteric, pupils round and equal Ear-nose-throat: Oropharynx clear, slightly dry Lymphatic: No cervical or supraclavicular adenopathy Lungs no rales or rhonchi Heart regular rate and rhythm Abd soft, nontender,  positive bowel sounds MSK no focal spinal tenderness, no joint edema Neuro: non-focal, well-oriented, appropriate affect Breasts: The right breast has been replaced by cancer as previously imaged.  The cancer is less erythematous and somewhat scaly, meaning we are seeing some evidence of it "drying up".  The left breast is unremarkable.  Both axillae are benign.   LAB RESULTS:  CMP     Component Value Date/Time   NA 138 05/04/2018 1350   K 4.2 05/04/2018 1350   CL 101 05/04/2018 1350   CO2 27 05/04/2018 1350   GLUCOSE 145 (H) 05/04/2018 1350   BUN 31 (H) 05/04/2018 1350   CREATININE 0.83 05/04/2018 1350   CALCIUM 9.0 05/04/2018 1350   PROT 6.5 05/04/2018 1350   ALBUMIN 2.9 (L) 05/04/2018 1350   AST 42 (H) 05/04/2018 1350   ALT 31 05/04/2018 1350   ALKPHOS 335 (H) 05/04/2018 1350   BILITOT 0.4 05/04/2018 1350   GFRNONAA >60 05/04/2018 1350   GFRAA >60 05/04/2018 1350    Lab Results  Component Value Date   TOTALPROTELP 5.6 (L) 04/14/2018   ALBUMINELP 2.6 (L) 04/14/2018   A1GS 0.5 (H) 04/14/2018   A2GS 1.0 04/14/2018   BETS 1.0 04/14/2018   GAMS 0.5 04/14/2018   MSPIKE Not Observed 04/14/2018   SPEI Comment 04/14/2018    No results found for: KPAFRELGTCHN, LAMBDASER, KAPLAMBRATIO  Lab Results  Component Value Date   WBC 7.2 05/04/2018    NEUTROABS 5.7 05/04/2018   HGB 11.4 (L) 05/04/2018   HCT 34.9 (L) 05/04/2018   MCV 93.6 05/04/2018   PLT 318 05/04/2018    @LASTCHEMISTRY @  No results found for: LABCA2  No components found for: [id number]  No results for input(s): INR in the last 168 hours.  No results found for: LABCA2  No results found for: YIF027  No results found for: XAJ287  No results found for: OMV672  Lab Results  Component Value Date   CA2729 1,174.1 (H) 04/15/2018    No components found for: HGQUANT  No results found for: CEA1 / No results found for: CEA1   No results found for: AFPTUMOR  No results found for: [place]  No results found for: PSA1  Appointment on 05/04/2018  Component Date Value Ref Range Status  . Sodium 05/04/2018 138  135 - 145 mmol/L Final  . Potassium 05/04/2018 4.2  3.5 - 5.1 mmol/L Final  . Chloride 05/04/2018 101  98 - 111 mmol/L Final  . CO2 05/04/2018 27  22 - 32 mmol/L Final  . Glucose, Bld 05/04/2018 145* 70 - 99 mg/dL Final  . BUN 05/04/2018 31* 8 - 23 mg/dL Final  . Creatinine 05/04/2018 0.83  0.44 - 1.00 mg/dL Final  . Calcium 05/04/2018 9.0  8.9 - 10.3 mg/dL Final  . Total Protein 05/04/2018 6.5  6.5 - 8.1 g/dL Final  . Albumin 05/04/2018 2.9* 3.5 - 5.0 g/dL Final  . AST 05/04/2018 42* 15 - 41 U/L Final  . ALT 05/04/2018 31  0 - 44 U/L Final  . Alkaline Phosphatase 05/04/2018 335* 38 - 126 U/L Final  . Total Bilirubin 05/04/2018 0.4  0.3 - 1.2 mg/dL Final  . GFR, Est Non Af Am 05/04/2018 >60  >60 mL/min Final  . GFR, Est AFR Am 05/04/2018 >60  >60 mL/min Final  . Anion gap 05/04/2018 10  5 - 15 Final   Performed at [facility], [place] [address] [place], [place] [address]  . WBC Count 05/04/2018 7.2  4.0 - 10.5 K/uL Final  . RBC 05/04/2018 3.73* 3.87 - 5.11 MIL/uL Final  . Hemoglobin 05/04/2018 11.4* 12.0 - 15.0 g/dL Final  . HCT 05/04/2018 34.9* 36.0 - 46.0 % Final  . MCV 05/04/2018 93.6  80.0 - 100.0 fL Final  . MCH  05/04/2018 30.6  26.0 - 34.0 pg Final  . MCHC 05/04/2018 32.7  30.0 - 36.0 g/dL Final  . RDW 05/04/2018 14.0  11.5 - 15.5 % Final  . Platelet Count 05/04/2018 318  150 - 400 K/uL Final  . nRBC 05/04/2018 0.0  0.0 - 0.2 % Final  . Neutrophils Relative % 05/04/2018 80  % Final  . Neutro Abs 05/04/2018 5.7  1.7 - 7.7 K/uL Final  . Lymphocytes Relative 05/04/2018 12  % Final  . Lymphs Abs 05/04/2018 0.9  0.7 - 4.0 K/uL Final  . Monocytes Relative 05/04/2018 7  % Final  . Monocytes Absolute 05/04/2018 0.5  0.1 - 1.0 K/uL Final  . Eosinophils Relative 05/04/2018 0  % Final  . Eosinophils Absolute 05/04/2018 0.0  0.0 - 0.5 K/uL Final  . Basophils Relative 05/04/2018 0  % Final  . Basophils Absolute 05/04/2018 0.0  0.0 - 0.1 K/uL Final  . Immature Granulocytes 05/04/2018 1  % Final  . Abs Immature Granulocytes 05/04/2018 0.05  0.00 - 0.07 K/uL Final   Performed at [facility], [place] [address], [place], [place] [address]    (this displays the last labs from the last 3 days)  Lab Results  Component Value Date   TOTALPROTELP 5.6 (L) 04/14/2018   ALBUMINELP 2.6 (L) 04/14/2018   A1GS 0.5 (H) 04/14/2018   A2GS 1.0 04/14/2018   BETS 1.0 04/14/2018   GAMS 0.5 04/14/2018   MSPIKE Not Observed 04/14/2018   SPEI Comment 04/14/2018   (this displays SPEP labs)  No results found for: KPAFRELGTCHN, LAMBDASER, KAPLAMBRATIO (kappa/lambda light chains)  No results found for: HGBA, HGBA2QUANT, HGBFQUANT, HGBSQUAN (Hemoglobinopathy evaluation)   No results found for: LDH  Lab Results  Component Value Date   IRON 57 04/14/2018   TIBC 311 04/14/2018   IRONPCTSAT 18 04/14/2018   (Iron and TIBC)  Lab Results  Component Value Date   FERRITIN 2,513 (H) 04/14/2018    Urinalysis    Component Value Date/Time   COLORURINE STRAW (A) 04/25/2018 1655   APPEARANCEUR HAZY (A) 04/25/2018 1655   LABSPEC 1.009 04/25/2018 1655   PHURINE 8.0 04/25/2018 1655   GLUCOSEU NEGATIVE  04/25/2018 1655   HGBUR NEGATIVE 04/25/2018 1655   BILIRUBINUR NEGATIVE 04/25/2018 1655   BILIRUBINUR neg 01/02/2014 1034   KETONESUR 5 (A) 04/25/2018 1655   PROTEINUR NEGATIVE 04/25/2018 1655   UROBILINOGEN 0.2 01/02/2014 1034   NITRITE NEGATIVE 04/25/2018 1655   LEUKOCYTESUR NEGATIVE 04/25/2018 1655     STUDIES:  Dg Chest 2 View  Result Date: 04/13/2018 CLINICAL DATA:  Per EMS, patient from home, c/o diarrhea worsening x2 days after taking a laxative. Denies N/V and abdominal pain. Weakness, short of breath, never a smoker, no other chest complaints EXAM: CHEST - 2 VIEW COMPARISON:  11/02/2010 FINDINGS: Patchy airspace opacities in the right middle lobe, new since previous. Left lung clear. Heart size and mediastinal contours are within normal limits. No effusion. Visualized bones unremarkable. IMPRESSION: Patchy right middle lobe airspace disease suggesting pneumonia. Electronically Signed   By: Lucrezia Europe M.D.   On: 04/13/2018 17:33   Ct Head Wo Contrast  Result Date: 04/25/2018 CLINICAL DATA:  Altered mental  status.  Metastatic breast carcinoma EXAM: CT HEAD WITHOUT CONTRAST TECHNIQUE: Contiguous axial images were obtained from the base of the skull through the vertex without intravenous contrast. COMPARISON:  April 14, 2018 FINDINGS: Brain: There is mild diffuse atrophy. There is apparent edema in the medial right temporal lobe, concerning for potential site of mass. This area on noncontrast enhanced CT measures 1.9 x 1.7 cm. There is decreased attenuation in the mid right cerebellum which is somewhat Morrow-defined. This area may well represent foci of vasogenic edema. There is no hemorrhage, extra-axial fluid collection, or midline shift. There is small vessel disease throughout the centra semiovale bilaterally, stable from recent prior study. There is evidence of an apparent prior infarct in the right lateral thalamus. There is no acute infarct apparent. Vascular: No hyperdense vessel.  There are foci of vascular calcification in the left vertebral artery as well as in both carotid siphon regions. 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 These changes are greatest in the left frontal and posterior parietal regions. Sinuses/Orbits: There is mucosal thickening in several ethmoid air cells. Other visualized paranasal sinuses are clear. Orbits appear symmetric bilaterally. Other: Mastoid air cells are clear. IMPRESSION: 1. Areas concerning for edema in the medial left temporal and mid right cerebellar regions. Potential neoplastic foci in these areas must be of concern. Contrast enhanced CT or MR could be helpful for further evaluation of these areas. It should be noted that solely from an imaging standpoint on noncontrast enhanced CT, edema of this nature could also be indicative of a nonneoplastic lesion such as abscess. Infarct in these areas is also possible, but the distribution of abnormal attenuation is more suggestive of a lesion other than infarct. 2. There is no midline shift or hemorrhage. No extra-axial fluid collections. 3. 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 4. There is mild atrophy with extensive supratentorial small vessel disease. Prior small apparent infarct in the lateral right thalamus. No acute appearing infarct is demonstrable currently. 5.  There are foci of arterial vascular calcification. 6.  Mucosal thickening noted in several ethmoid air cells. Electronically Signed   By: Lowella Grip III M.D.   On: 04/25/2018 13:36   Ct Head W & Wo Contrast  Result Date: 04/14/2018 CLINICAL DATA:  Progressive weakness and weight loss for several months. Diarrhea. Liver and bone metastases. Unknown primary. Bilateral pulmonary nodules. EXAM: CT HEAD WITHOUT AND WITH CONTRAST TECHNIQUE: Contiguous axial images were obtained from the base of  the skull through the vertex without and with intravenous contrast CONTRAST:  23m OMNIPAQUE IOHEXOL 300 MG/ML  SOLN COMPARISON:  None. FINDINGS: Brain: Mild atrophy and moderate diffuse white matter disease is present. There is no pathologic enhancement to suggest metastatic disease to the brain. The ventricles are of proportionate to the degree of atrophy. Remote lacunar infarcts are present in the basal ganglia bilaterally. Brainstem and cerebellum are normal. Vascular: Atherosclerotic calcifications are present within the cavernous internal carotid arteries and at the dural margin the left vertebral artery. There is no hyperdense vessel. Skull: Scattered lytic lesions present throughout the calvarium compatible with known bone metastases. No expansile lesions are present. An indeterminate scalp soft tissue lesion over the left parietal skull near the vertex measures 2.5 x 2.4 x 0.6 cm. There is diffuse infiltration of the calvarium subjacent to this area. Sinuses/Orbits: The paranasal sinuses and mastoid air cells  are clear. The globes and orbits are within normal limits. IMPRESSION: 1. No evidence for metastatic disease to brain. 2. Extensive osseous metastases throughout the skull. 3. Focal soft tissue in the high left parietal scalp. This could represent a metastatic deposit. Electronically Signed   By: San Morelle M.D.   On: 04/14/2018 12:45   Ct Chest W Contrast  Result Date: 04/14/2018 CLINICAL DATA:  Right middle lobe pneumonia. EXAM: CT CHEST WITH CONTRAST TECHNIQUE: Multidetector CT imaging of the chest was performed during intravenous contrast administration. CONTRAST:  18m OMNIPAQUE IOHEXOL 300 MG/ML  SOLN COMPARISON:  Chest x-ray 04/13/2018 FINDINGS: Cardiovascular: Heart size upper normal. No substantial pericardial effusion. Coronary artery calcification is evident. Atherosclerotic calcification is noted in the wall of the thoracic aorta. Ascending thoracic aorta measures 3.8 cm  diameter. Mediastinum/Nodes: Soft tissue attenuation is identified in the AP window without a discrete lymph node. Small prevascular lymph nodes are so seated with small lymph nodes in each hilar region. Circumferential wall thickening is noted in the distal esophagus and fluid within the esophageal lumen is compatible with reflux or dysmotility. There is no axillary lymphadenopathy. Lungs/Pleura: The central tracheobronchial airways are patent. Numerous Morrow-defined pulmonary nodules are scattered through both lungs. Index nodule medial right upper lobe (45/11) measures 5 mm. Index right lower lobe nodule seen posteriorly on 102/11 measures 8 mm. Representative peripheral left lower lobe nodule (83/11) measures 6 mm. Small bilateral pleural effusions evident. Upper Abdomen: 11 mm low-density lesion in the liver, better characterized on yesterday's abdomen CT. Additional small low-density lesions in the liver. Calcified gallstones evident. Small cyst noted right kidney. Musculoskeletal: Widespread bony metastatic involvement is evident. 7.7 x 6.8 x 2.0 cm mass identified in the right breast. IMPRESSION: 1. No evidence for right middle lobe pneumonia. Opacity seen over the right breast on recent chest x-ray likely represents the patient's known large right breast mass. 2. Numerous Morrow-defined pulmonary nodules scattered through both lungs, highly suspicious for metastatic disease. 3. Widespread bony metastatic involvement. 4. Hypodense liver lesions concerning for metastatic involvement. 5. Small bilateral pleural effusions. 6.  Aortic Atherosclerois (ICD10-170.0) Electronically Signed   By: EMisty StanleyM.D.   On: 04/14/2018 13:52   Mr BJeri CosWBEContrast  Result Date: 04/26/2018 CLINICAL DATA:  78y/o  F; metastatic breast cancer. EXAM: MRI HEAD WITHOUT AND WITH CONTRAST TECHNIQUE: Multiplanar, multiecho pulse sequences of the brain and surrounding structures were obtained without and with intravenous contrast.  CONTRAST:  4 cc Gadavist COMPARISON:  04/25/2018 CT head. FINDINGS: Brain: Right posterolateral frontal region irregular dural thickening contiguous with a calvarial metastasis indicating dural invasion (series 10, image 115). No abnormal enhancement of the brain parenchyma. No reduced diffusion to suggest acute or early subacute infarction. Punctate foci of susceptibility hypointensity within left lentiform nucleus and right frontal periventricular white matter compatible hemosiderin deposition of chronic microhemorrhage. No extra-axial collection, hydrocephalus, or herniation. Confluent nonspecific T2 FLAIR hyperintensities in subcortical and periventricular white matter as well as the pons are compatible with moderate chronic microvascular ischemic changes for age. Moderate volume loss of the brain. Vascular: Normal flow voids. Skull and upper cervical spine: Numerous low T1 signal foci within the calvarium with diffusion hyperintensity enhancement compatible with osseous metastatic disease. The lesion in the left paramedian parietal region is mildly expansile into the scalp. Sinuses/Orbits: Negative. Other: None. IMPRESSION: 1. Multiple calvarial metastasis. Small area of irregular dural thickening associated with a calvarial metastasis in right frontal region compatible with dural invasion. 2. No  brain parenchymal metastasis identified. 3. Moderate chronic microvascular ischemic changes and volume loss of the brain. Electronically Signed   By: Kristine Garbe M.D.   On: 04/26/2018 16:10   Ct Abdomen Pelvis W Contrast  Result Date: 04/13/2018 CLINICAL DATA:  Worsening diarrhea x2 days after taking laxatives. EXAM: CT ABDOMEN AND PELVIS WITH CONTRAST TECHNIQUE: Multidetector CT imaging of the abdomen and pelvis was performed using the standard protocol following bolus administration of intravenous contrast. CONTRAST:  51m ISOVUE-300 IOPAMIDOL (ISOVUE-300) INJECTION 61%, 340mOMNIPAQUE IOHEXOL 300  MG/ML SOLN COMPARISON:  None. FINDINGS: Lower chest: Small hiatal hernia. Mild thickening of what may represent the distal esophagus raises possibility of esophagitis versus partial volume averaging of the uppermost aspect of the hiatal hernia. Top normal heart size without pericardial effusion. Subpleural Morrow-defined pulmonary opacities that may represent postinfectious or inflammatory change versus atelectasis are identified. Some however appear more nodular raising concern for pulmonary nodules, example a 3 mm right lower lobe nodule on series 7/13 with smaller nodular densities seen in the right lower lobe. Given abnormality of the axial and appendicular skeleton concerning for diffuse osseous metastasis or myeloma, these nodular opacities raise concern for pulmonary metastasis as well. Hepatobiliary: Morrow-defined hypodensities in the right hepatic lobe are identified, the largest measuring up to 14 mm with peripheral puddling of contrast suggestive of a hemangioma is noted. Additional 11 mm hypodensity in the right hepatic lobe also appears to demonstrate similar enhancement pattern on repeat delayed imaging and would be more in keeping with a hemangioma. Further caudad in the right hepatic lobe is an Morrow-defined 12 mm hypodensity that appears to have filled in on repeat delayed imaging. Given pulmonary and osseous findings however, the possibility of hepatic metastasis is not excluded. MRI with liver protocol may help for further correlation. No biliary dilatation. Numerous gallstones are seen within the gallbladder. Pancreas: Normal Spleen: Normal size spleen with Morrow-defined hypodensity medially measuring 12 mm, nonspecific in etiology. Adrenals/Urinary Tract: Normal bilateral adrenal glands and kidneys. No obstructive uropathy. Urinary bladder is physiologically distended without focal mural thickening or calculus. Stomach/Bowel: Large stool ball in the rectum with circumferential anorectal thickening about  this finding raises concern for stercoral proctocolitis. No bowel obstruction. The stomach and small intestine are nonacute. No evidence of acute appendicitis. Vascular/Lymphatic: Mild aortoiliac and branch vessel atherosclerosis without aneurysm. No adenopathy. Reproductive: Uterus and bilateral adnexa are unremarkable. Other: Mild soft tissue anasarca.  No ascites or free air. 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 Differential possibilities may include myeloma or diffuse osteo lytic metastasis. Moderate compression deformity of L1 with 50% height loss is noted as well as mild superior endplate compression of T11. No retropulsion is noted. IMPRESSION: 1. Morrow-defined pulmonary opacities at the lung bases some which are nodular in appearance raising concern for possible metastatic disease. Concomitant in postinfectious or inflammatory abnormalities are not excluded. 2. 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 Differential possibilities may include osteolytic metastasis of unknown primary or myeloma among some considerations. 3. Three hypodense lesions of the liver that appear to demonstrate peripheral puddling of contrast more likely to represent hemangiomata. However given the pulmonary and osseous findings, metastatic disease is still within differential considerations. 4. Anal rectal circumferential mural thickening surrounding a large stool ball. Stercoral proctocolitis is raised. These results were called by telephone at the time of interpretation on 04/13/2018 at 7:21 pm to Dr. KANunzio Cory  [facility] , who verbally acknowledged these results. Electronically Signed   By: Ashley Royalty M.D.   On: 04/13/2018 19:21   Dg Chest Port 1 View  Result Date: 04/25/2018 CLINICAL DATA:  Weakness and altered mental status. Metastatic breast cancer. EXAM:  PORTABLE CHEST 1 VIEW COMPARISON:  Chest CT 04/14/2018 and chest x-ray 04/13/2018 FINDINGS: The cardiac silhouette, mediastinal and hilar contours are stable. Moderate tortuosity of the thoracic aorta. Density over the right lower chest is likely due to the patient's breast mass. Scattered pulmonary lesions consistent with known pulmonary metastasis although much better seen on the chest CT. Do not see any definite acute overlying pulmonary process. Diffuse osseous metastatic disease is again noted. IMPRESSION: 1. Stable pulmonary and osseous metastatic disease. 2. No definite acute overlying pulmonary process. Electronically Signed   By: Marijo Sanes M.D.   On: 04/25/2018 12:11   Dg Swallowing Func-speech Pathology  Result Date: 04/15/2018 Objective Swallowing Evaluation: Type of Study: MBS-Modified Barium Swallow Study  Patient Details Name: TIERRIA WATSON MRN: [id number] Date of Birth: 03/10/1941 Today's Date: 04/15/2018 Time: SLP Start Time (ACUTE ONLY): 1335 -SLP Stop Time (ACUTE ONLY): 1350 SLP Time Calculation (min) (ACUTE ONLY): 15 min Past Medical History: Past Medical History: Diagnosis Date . Allergy  . Hypertension  Past Surgical History: No past surgical history on file. HPI: 78 yo patient referred for MBS due to pt having dysphagia.  Pt admitted with cachexia, stool burden, breast and scalp mass - likely metastatic breast cancer.  She underwent clinical evaluation of swallow yesterday and was placed on honey thick liquids and regular foods.  MBS indicated.  Reports voice became hoarse last April - denies significant issues with reflux.   Subjective: The patient was seen sitting upright in chair Assessment / Plan / Recommendation CHL IP CLINICAL IMPRESSIONS 04/15/2018 Clinical Impression Patient presents with mild pharyngeal dysphagia with decreased timing and adequacy of laryngeal elevation/closure causing minimal aspiration of thin with head neutral.  Aspiration was silent when trace but audible with  cough response when mild. Chin tuck posture with pt fully upright and small boluses, airway protection intact.   Pharyngeal swallow is strong without significant residuals with thin, nectar, cracker - Limited amount of barium pt was provided due to pt's bowel issue.  Recommend advance diet to regular/thin with chin tuck posture.  Educated pt to aspiration precautions and mitigation strategies using teach back.   SLP Visit Diagnosis Dysphagia, pharyngeal phase (R13.13) Attention and concentration deficit following -- Frontal lobe and executive function deficit following -- Impact on safety and function Mild aspiration risk   CHL IP TREATMENT RECOMMENDATION 04/15/2018 Treatment Recommendations Therapy as outlined in treatment plan below   Prognosis 04/15/2018 Prognosis for Safe Diet Advancement Fair Barriers to Reach Goals Other (Comment) Barriers/Prognosis Comment -- CHL IP DIET RECOMMENDATION 04/15/2018 SLP Diet Recommendations Regular solids;Thin liquid Liquid Administration via Straw Medication Administration Whole meds with puree Compensations Slow rate;Small sips/bites;Chin tuck Postural Changes Remain semi-upright after after feeds/meals (Comment);Seated upright at 90 degrees   CHL IP OTHER RECOMMENDATIONS 04/15/2018 Recommended Consults Other (Comment) Oral Care Recommendations Oral care QID Other Recommendations --   CHL IP FOLLOW UP RECOMMENDATIONS 04/14/2018 Follow up Recommendations Other (comment)   CHL IP FREQUENCY AND DURATION 04/15/2018 Speech Therapy Frequency (ACUTE ONLY) min 2x/week Treatment Duration 2 weeks      CHL IP ORAL PHASE 04/15/2018 Oral Phase Impaired Oral - Pudding Teaspoon -- Oral - Pudding Cup -- Oral - Honey Teaspoon -- Oral - Honey Cup --  Oral - Nectar Teaspoon -- Oral - Nectar Cup WFL Oral - Nectar Straw -- Oral - Thin Teaspoon WFL Oral - Thin Cup WFL Oral - Thin Straw WFL Oral - Puree NT Oral - Mech Soft -- Oral - Regular WFL Oral - Multi-Consistency -- Oral - Pill -- Oral Phase - Comment  --  CHL IP PHARYNGEAL PHASE 04/15/2018 Pharyngeal Phase Impaired Pharyngeal- Pudding Teaspoon -- Pharyngeal -- Pharyngeal- Pudding Cup -- Pharyngeal -- Pharyngeal- Honey Teaspoon -- Pharyngeal -- Pharyngeal- Honey Cup -- Pharyngeal -- Pharyngeal- Nectar Teaspoon -- Pharyngeal -- Pharyngeal- Nectar Cup Penetration/Aspiration during swallow;WFL Pharyngeal -- Pharyngeal- Nectar Straw -- Pharyngeal -- Pharyngeal- Thin Teaspoon WFL Pharyngeal -- Pharyngeal- Thin Cup Reduced airway/laryngeal closure;Penetration/Aspiration during swallow Pharyngeal Material enters airway, passes BELOW cords without attempt by patient to eject out (silent aspiration) Pharyngeal- Thin Straw Penetration/Apiration after swallow Pharyngeal Material enters airway, passes BELOW cords without attempt by patient to eject out (silent aspiration);Material enters airway, passes BELOW cords and not ejected out despite cough attempt by patient Pharyngeal- Puree NT Pharyngeal -- Pharyngeal- Mechanical Soft -- Pharyngeal -- Pharyngeal- Regular WFL Pharyngeal -- Pharyngeal- Multi-consistency -- Pharyngeal -- Pharyngeal- Pill -- Pharyngeal -- Pharyngeal Comment chin tuck helpful to prevent aspiration - trace penetration only which cleared  CHL IP CERVICAL ESOPHAGEAL PHASE 04/15/2018 Cervical Esophageal Phase WFL Pudding Teaspoon -- Pudding Cup -- Honey Teaspoon -- Honey Cup -- Nectar Teaspoon -- Nectar Cup -- Nectar Straw -- Thin Teaspoon -- Thin Cup -- Thin Straw -- Puree -- Mechanical Soft -- Regular -- Multi-consistency -- Pill -- Cervical Esophageal Comment -- Macario Golds 04/15/2018, 2:18 PM    Luanna Salk, MS [facility] SLP Acute Rehab Services Pager [phone] Office [phone]           Dg Esophagus Inc Scout Chest & Delayed Img Double Cm  Result Date: 04/16/2018 CLINICAL DATA:  78 year old female with dysphagia nausea and vomiting. Metastatic breast cancer. Subsequent encounter. EXAM: ESOPHOGRAM/BARIUM SWALLOW TECHNIQUE: Single contrast  examination was performed using  thin barium. FLUOROSCOPY TIME:  Fluoroscopy Time:  1 minutes and 6 seconds Radiation Exposure Index: 6.5 mGy COMPARISON:  04/15/2018 speech swallow.  04/13/2018 chest CT. FINDINGS: Exam was tailored to the patient. Single contrast exam performed with thin barium. No laryngeal aspiration occurred with chin tuck position. Slightly blunted primary esophageal stripping wave. No esophageal obstructing lesion. Mild smooth narrowing of the distal esophagus just above small hiatal hernia. 2 cm proximal to this level, there is a small circumferential esophageal stricture. The esophagus just above this level has a slightly lobulated appearance left lateral aspect. Cause of strictures indeterminate. Retained secretions limited evaluation esophageal mucosa. Patient attempted but was not able to swallow barium tablet. IMPRESSION: 1. Exam was tailored to the patient. 2. No laryngeal aspiration occurred with chin tuck position. 3. Slightly blunted primary esophageal stripping wave. 4. No esophageal obstructing lesion. 5. Mild smooth narrowing of the distal esophagus just above small hiatal hernia. 2 cm proximal to this level, there is a small circumferential esophageal stricture. The esophagus just above this stricture has a slightly lobulated appearance along left lateral aspect. Cause of stricture is indeterminate. 6. Retained secretions limited evaluation of esophageal mucosa. 7. Patient attempted but was not able to swallow barium tablet (poor oropharyngeal phase). Electronically Signed   By: Genia Del M.D.   On: 04/16/2018 10:14     ELIGIBLE FOR AVAILABLE RESEARCH PROTOCOL: no   ASSESSMENT: 78 y.o. [place], [place] woman presenting with weight loss and failure to thrive as  well as persistent diarrhea, CT abdomen and pelvis 04/13/2018 showing lytic bone lesions, possible liver and lung lesions, and evidence of proctocolitis, with exam showing an obvious right-sided breast cancer; now  readmitted with altered mental status  METASTATIC BREAST CANCER: January 2020 (1)  Biopsy of right breast and scalp lesions 04/16/2018 confirms invasive breast cancer, estrogen and progesterone receptor strongly positive, HER-2 nonamplified, with an MIB-1 of 15%  (a) CT scans of the chest, abdomen, pelvis and/brain 01/10-02/2019 show lung, liver and bone lesions, no brain involvement  (b) DO NOT RESUSCITATE order is in place  (4) anastrozole started 04/17/2018 (a)palbociclib started 05/07/2018  (b) denosumab/xgeva to start 05/28/2018  (5) altered mental status: Resolved             (a) head CT w/o contrast 04/25/2018 shows edema and areas suspicious for metastases not seen on CT 04/14/2018           PLAN: Kirin remains very weak.  She seems to be participating in the rehab portion of [place] but she does not appear to be gaining weight.  There may have been some improvement in function in terms of walking to bathroom and using a walker.  She continues on dexamethasone which was used because of her encephalopathy, leading to her more recent admission.  I wrote for a taper so she will be on 4 mg early for the next week and then 2 mg daily thereafter.  At the next visit we will write a taper to off depending on her functional status  She is tolerating the anastrozole well.  There is a slight indication of response in terms of the appearance of the right breast area.  Today we added palbociclib.  I discussed the possible toxicity side effects and complications of this agent in detail with her and her daughters and also the oral chemotherapy pharmacist met with them and gave them additional information.  The patient was given samples to take back to [facility] clinic, and the order was written for her to start on 05/07/2018.  I specifically directed the daughter who was present today to give the medication to the head nurse at [facility] and she agreed to do  that  Najae will return to see [place] on 05/28/2018.  That will be her "off week".  If her counts allow then we will resume the palbociclib the following Monday.  On 05/28/2018 she will also receive her first denosumab/Xgeva dose.  The patient has a DNR order in place at [facility].  They know to call us for any other issues that may develop before the next visit.  [name], [name], MD  05/04/18 5:48 PM Medical Oncology and Hematology [facility] [address] [place],  [address] Tel. [phone]    Fax. [phone]    I, Jacqualyn Posey am acting as a Education administrator for Chauncey Cruel, MD.   I, Lurline Del MD, have reviewed the above documentation for accuracy and completeness, and I agree with the above.

## 2018-05-04 ENCOUNTER — Telehealth: Payer: Self-pay | Admitting: Pharmacist

## 2018-05-04 ENCOUNTER — Inpatient Hospital Stay: Payer: Medicare Other | Attending: Oncology | Admitting: Oncology

## 2018-05-04 ENCOUNTER — Inpatient Hospital Stay: Payer: Medicare Other

## 2018-05-04 ENCOUNTER — Telehealth: Payer: Self-pay | Admitting: Adult Health

## 2018-05-04 ENCOUNTER — Telehealth: Payer: Self-pay

## 2018-05-04 VITALS — BP 134/80 | HR 107 | Temp 97.8°F | Resp 18 | Ht 60.0 in | Wt 80.7 lb

## 2018-05-04 DIAGNOSIS — C787 Secondary malignant neoplasm of liver and intrahepatic bile duct: Secondary | ICD-10-CM | POA: Diagnosis not present

## 2018-05-04 DIAGNOSIS — Z791 Long term (current) use of non-steroidal anti-inflammatories (NSAID): Secondary | ICD-10-CM | POA: Diagnosis not present

## 2018-05-04 DIAGNOSIS — Z66 Do not resuscitate: Secondary | ICD-10-CM | POA: Insufficient documentation

## 2018-05-04 DIAGNOSIS — C50911 Malignant neoplasm of unspecified site of right female breast: Secondary | ICD-10-CM | POA: Diagnosis not present

## 2018-05-04 DIAGNOSIS — I10 Essential (primary) hypertension: Secondary | ICD-10-CM | POA: Insufficient documentation

## 2018-05-04 DIAGNOSIS — Z170 Estrogen receptor positive status [ER+]: Secondary | ICD-10-CM | POA: Diagnosis not present

## 2018-05-04 DIAGNOSIS — C50919 Malignant neoplasm of unspecified site of unspecified female breast: Secondary | ICD-10-CM

## 2018-05-04 DIAGNOSIS — Z79811 Long term (current) use of aromatase inhibitors: Secondary | ICD-10-CM | POA: Insufficient documentation

## 2018-05-04 DIAGNOSIS — C7800 Secondary malignant neoplasm of unspecified lung: Secondary | ICD-10-CM | POA: Insufficient documentation

## 2018-05-04 DIAGNOSIS — C7951 Secondary malignant neoplasm of bone: Secondary | ICD-10-CM | POA: Diagnosis not present

## 2018-05-04 LAB — CBC WITH DIFFERENTIAL (CANCER CENTER ONLY)
ABS IMMATURE GRANULOCYTES: 0.05 10*3/uL (ref 0.00–0.07)
Basophils Absolute: 0 10*3/uL (ref 0.0–0.1)
Basophils Relative: 0 %
Eosinophils Absolute: 0 10*3/uL (ref 0.0–0.5)
Eosinophils Relative: 0 %
HCT: 34.9 % — ABNORMAL LOW (ref 36.0–46.0)
Hemoglobin: 11.4 g/dL — ABNORMAL LOW (ref 12.0–15.0)
Immature Granulocytes: 1 %
LYMPHS ABS: 0.9 10*3/uL (ref 0.7–4.0)
LYMPHS PCT: 12 %
MCH: 30.6 pg (ref 26.0–34.0)
MCHC: 32.7 g/dL (ref 30.0–36.0)
MCV: 93.6 fL (ref 80.0–100.0)
Monocytes Absolute: 0.5 10*3/uL (ref 0.1–1.0)
Monocytes Relative: 7 %
Neutro Abs: 5.7 10*3/uL (ref 1.7–7.7)
Neutrophils Relative %: 80 %
Platelet Count: 318 10*3/uL (ref 150–400)
RBC: 3.73 MIL/uL — ABNORMAL LOW (ref 3.87–5.11)
RDW: 14 % (ref 11.5–15.5)
WBC Count: 7.2 10*3/uL (ref 4.0–10.5)
nRBC: 0 % (ref 0.0–0.2)

## 2018-05-04 LAB — CMP (CANCER CENTER ONLY)
ALT: 31 U/L (ref 0–44)
AST: 42 U/L — AB (ref 15–41)
Albumin: 2.9 g/dL — ABNORMAL LOW (ref 3.5–5.0)
Alkaline Phosphatase: 335 U/L — ABNORMAL HIGH (ref 38–126)
Anion gap: 10 (ref 5–15)
BUN: 31 mg/dL — AB (ref 8–23)
CO2: 27 mmol/L (ref 22–32)
Calcium: 9 mg/dL (ref 8.9–10.3)
Chloride: 101 mmol/L (ref 98–111)
Creatinine: 0.83 mg/dL (ref 0.44–1.00)
GFR, Est AFR Am: >60 mL/min (ref >60)
GFR, Estimated: >60 mL/min (ref >60)
Glucose, Bld: 145 mg/dL — ABNORMAL HIGH (ref 70–99)
Potassium: 4.2 mmol/L (ref 3.5–5.1)
Sodium: 138 mmol/L (ref 135–145)
Total Bilirubin: 0.4 mg/dL (ref 0.3–1.2)
Total Protein: 6.5 g/dL (ref 6.5–8.1)

## 2018-05-04 MED ORDER — PALBOCICLIB 125 MG PO CAPS: 125.0000 mg | ORAL_CAPSULE | Freq: Every day | ORAL | 6 refills | Status: AC

## 2018-05-04 NOTE — Telephone Encounter (Signed)
Oral Chemotherapy Pharmacist Encounter   I spoke with patient and daughter, Morgan Morrow, in exam room for overview of: Ibrance (palbociclib) for the treatment of metastatic, hormone receptor positive, HER-2 receptor negative breast cancer in conjunction with anastrozole, planned duration until disease progression or unacceptable toxicity.   Patient currently resides at [facility].  Counseled patient on administration, dosing, side effects, monitoring, drug-food interactions, safe handling, storage, and disposal.  Patient will take Ibrance 168m capsules, 1 capsule by mouth once daily with breakfast for 3 weeks on, 1 week off.  Patient informed that [facility] Homewill be changing to tablet formulation as of 07/04/2018, and will be dispensed in a box of 3 cards as opposed to current dispensing of a bottle with 21 capsules.  Patient knows to avoid grapefruit and grapefruit juice.  Patient is taking anstrozole once daily.  Ibrance start date: 05/07/2018  Adverse effects include but are not limited to: fatigue, hair loss, GI upset, nausea, decreased blood counts, and increased upper respiratory infections. Severe, life-threatening, and/or fatal interstitial lung disease (ILD) and/or pneumonitis may occur with CDK 4/6 inhibitors.  Patient will obtain anti diarrheal and alert the office of 4 or more loose stools above baseline.  Patient reminded of WBC check prior to initiation of cycle 2 of Ibrance.  Reviewed with patient importance of keeping a medication schedule and plan for any missed doses.  Mrs. WEarleen Newportvoiced understanding and appreciation.   All questions answered. Medication reconciliation performed and medication/allergy list updated.  Dispensed samples to patient in an effort to start patient on combination therapy as soon as possible.  Medication: Ibrance 1232mcapsules Instructions: Take 1 capsule (125 mg) by mouth once daily after breakfast, for 21 days on, 7 days off, repeated every  28 days Quantity dispensed: 21 Days supply: 28 Manufacturer: Pfizer Lot: [id number]: October 02, 2018  Insurance authorization will be submitted. Once approved, test claim will be run at local pharmacy for determination of copayment due. We extensively discussed medicare copayment and possibility for high copayment. We discussed options for copayment assistance including copayment grant foundations and manufacturer compassionate use program. Patient instructed that if they are enrolled into manufacturer compassionate use program that medication will be shipped directly to their home from dispensing pharmacy of program's choosing.  We will follow-up with patient's daughter, DiBeverlee Nimsfor continued medication acquisition once insurance authorization is approved and copayment is known.  Patient knows to call the office with questions or concerns. Oral Oncology Clinic will continue to follow.  JeJohny DrillingPharmD, BCPS, BCOP  05/04/2018   2:50 PM Oral Oncology Clinic [phone]

## 2018-05-04 NOTE — Telephone Encounter (Signed)
Gave avs and calendar ° °

## 2018-05-04 NOTE — Telephone Encounter (Signed)
Oral Oncology Patient Advocate Encounter  Received notification from [employer] that prior authorization for Leslee Home is required.  PA submitted on CoverMyMeds Key [id number] Status is pending  Oral Oncology Clinic will continue to follow.  [place] Patient [place] Phone [phone] Fax [phone]

## 2018-05-04 NOTE — Telephone Encounter (Signed)
Oral Oncology Patient Advocate Encounter  Prior Authorization for Morgan Morrow has been approved.    PA# [id number] Effective dates: 05/04/18 through 04/04/19  Oral Oncology Clinic will continue to follow.   [place] Patient [place] Phone [phone] Fax [phone]

## 2018-05-04 NOTE — Telephone Encounter (Signed)
Oral Oncology Pharmacist Encounter  Received new prescription for Ibrance (palbociclib) for the treatment of metastatic, hormone receptor positive, HER-2 receptor negative breast cancer in conjunction with anastrozole, planned duration until disease progression or unacceptable toxicity.  Labs from Epic assessed, okay for treatment.  Current medication list in Epic reviewed, moderate DDIs with Ibrance and amlodipine identified:  Category C interaction: Leslee Home is a moderate inhibitor of CYP3A4 therefore leading to possible decreased metabolism and increase systemic exposure to patient's amlodipine.  BPs in Epic reviewed, most readings above normal limits.  No change to current therapy is indicated at this time.  Prescription has been e-scribed to the [facility] by MD for benefits analysis and approval.  Oral Oncology Clinic will continue to follow for insurance authorization, copayment issues, initial counseling and start date.  Johny Drilling, PharmD, BCPS, BCOP  05/04/2018 2:40 PM Oral Oncology Clinic [phone]

## 2018-05-10 ENCOUNTER — Telehealth: Payer: Self-pay

## 2018-05-10 NOTE — Telephone Encounter (Signed)
Oral Oncology Patient Advocate Encounter  Leslee Home copay is [phone], there are no grant funds open for her disease state at this time. I am able to help the patient apply for manufacturer assistance and have the drug shipped to her home each month free of charge, if approved.  I called Dianna, the patients daughter and explained this to her. Dianna agreed to proceed with the manufacturer assistance application.  Dianna brought in her moms income document and signed the application.   I faxed the completed application on 0/7/68.  This encounter will be updated until final determination.  [place] Patient [place] Phone [phone] Fax [phone]

## 2018-05-15 ENCOUNTER — Non-Acute Institutional Stay: Payer: Medicare Other | Admitting: Internal Medicine

## 2018-05-15 NOTE — Telephone Encounter (Signed)
Oral Oncology Patient Advocate Encounter  I followed up with Morgan Morrow today and they have every they need from [place]. The application is currently in processing.  This encounter will be updated until final determination.  [place] Patient [place] Phone [phone] Fax [phone]

## 2018-05-16 ENCOUNTER — Other Ambulatory Visit: Payer: Self-pay | Admitting: Oncology

## 2018-05-16 ENCOUNTER — Non-Acute Institutional Stay: Payer: Medicare Other | Admitting: Internal Medicine

## 2018-05-16 NOTE — Progress Notes (Unsigned)
I was called by Morgan Morrow from hospice regarding Morgan Morrow she wondered if Morgan Morrow was hospice appropriate she is not so long as she is being actively treated which so far as I know the family wishes her to be.  She however is a good candidate for in-home palliative care and that is what we decided.

## 2018-05-17 ENCOUNTER — Telehealth: Payer: Self-pay

## 2018-05-17 ENCOUNTER — Non-Acute Institutional Stay: Payer: Medicare Other | Admitting: Internal Medicine

## 2018-05-17 DIAGNOSIS — Z515 Encounter for palliative care: Secondary | ICD-10-CM

## 2018-05-17 NOTE — Telephone Encounter (Signed)
Oral Oncology Patient Advocate Encounter  I was able to get the patient a grant so I called [place] and cancelled this application.  [place] Patient [place] Phone [phone] Fax [phone]

## 2018-05-17 NOTE — Telephone Encounter (Signed)
Oral Oncology Patient Advocate Encounter  I was successful at securing a grant with [employer]. This will keep the out of pocket expense for Ibrance at $0. The grant information is as follows and has been shared with [place].  Approval dates: 05/17/18-06/16/18 This is a conditional approval. Once the patient gets the letter, fills out the form attached and sends it back she will be approved for the rest of this year. ID: [id number] Group: [id number] BIN: [id number] PCN: [id number]  I called the patients daughter, Peter Congo and gave her the good news. Dianna will pick up Ibrance from [place] on 05/18/18.    [place] Patient [place] Phone [phone] Fax [phone]

## 2018-05-18 MED FILL — IBRANCE 125 MG CAPSULE: 125 | 28 days supply | Qty: 21 | Fill #0

## 2018-05-18 NOTE — Telephone Encounter (Signed)
Oral Oncology Patient Advocate Encounter  Confirmed with [place] that Lucia Bitter was picked up on 05/18/18 with a $0 copay using grant.   [place] Patient [place] Phone [phone] Fax [phone]

## 2018-05-22 ENCOUNTER — Telehealth: Payer: Self-pay | Admitting: *Deleted

## 2018-05-22 NOTE — Telephone Encounter (Signed)
This RN was requested to call in MMW for pt per home health nurse.  Of note pt is now presently being assisted by her daughter - Peter Congo.  Dianna is requested to contact her regarding communication.  [name] also states pt's pharmacy needs to be [facility] in [employer] off of [address].

## 2018-05-26 ENCOUNTER — Encounter (HOSPITAL_COMMUNITY): Payer: Self-pay

## 2018-05-26 ENCOUNTER — Emergency Department (HOSPITAL_COMMUNITY): Payer: Medicare Other

## 2018-05-26 ENCOUNTER — Inpatient Hospital Stay (HOSPITAL_COMMUNITY)
Admission: EM | Admit: 2018-05-26 | Discharge: 2018-06-03 | DRG: 438 | Disposition: E | Payer: Medicare Other | Attending: Internal Medicine | Admitting: Internal Medicine

## 2018-05-26 ENCOUNTER — Other Ambulatory Visit: Payer: Self-pay

## 2018-05-26 DIAGNOSIS — R5081 Fever presenting with conditions classified elsewhere: Secondary | ICD-10-CM | POA: Diagnosis present

## 2018-05-26 DIAGNOSIS — E43 Unspecified severe protein-calorie malnutrition: Secondary | ICD-10-CM | POA: Diagnosis present

## 2018-05-26 DIAGNOSIS — R64 Cachexia: Secondary | ICD-10-CM | POA: Diagnosis present

## 2018-05-26 DIAGNOSIS — R6510 Systemic inflammatory response syndrome (SIRS) of non-infectious origin without acute organ dysfunction: Secondary | ICD-10-CM | POA: Diagnosis not present

## 2018-05-26 DIAGNOSIS — Z79811 Long term (current) use of aromatase inhibitors: Secondary | ICD-10-CM | POA: Diagnosis not present

## 2018-05-26 DIAGNOSIS — K859 Acute pancreatitis without necrosis or infection, unspecified: Secondary | ICD-10-CM | POA: Diagnosis not present

## 2018-05-26 DIAGNOSIS — Z9221 Personal history of antineoplastic chemotherapy: Secondary | ICD-10-CM

## 2018-05-26 DIAGNOSIS — E440 Moderate protein-calorie malnutrition: Secondary | ICD-10-CM | POA: Diagnosis not present

## 2018-05-26 DIAGNOSIS — K858 Other acute pancreatitis without necrosis or infection: Principal | ICD-10-CM | POA: Diagnosis present

## 2018-05-26 DIAGNOSIS — I10 Essential (primary) hypertension: Secondary | ICD-10-CM | POA: Diagnosis present

## 2018-05-26 DIAGNOSIS — Z681 Body mass index (BMI) 19 or less, adult: Secondary | ICD-10-CM | POA: Diagnosis not present

## 2018-05-26 DIAGNOSIS — D61818 Other pancytopenia: Secondary | ICD-10-CM | POA: Diagnosis present

## 2018-05-26 DIAGNOSIS — R400 Somnolence: Secondary | ICD-10-CM | POA: Diagnosis present

## 2018-05-26 DIAGNOSIS — C7951 Secondary malignant neoplasm of bone: Secondary | ICD-10-CM | POA: Diagnosis present

## 2018-05-26 DIAGNOSIS — C50911 Malignant neoplasm of unspecified site of right female breast: Secondary | ICD-10-CM | POA: Diagnosis present

## 2018-05-26 DIAGNOSIS — C787 Secondary malignant neoplasm of liver and intrahepatic bile duct: Secondary | ICD-10-CM | POA: Diagnosis present

## 2018-05-26 DIAGNOSIS — D709 Neutropenia, unspecified: Secondary | ICD-10-CM

## 2018-05-26 DIAGNOSIS — Z7189 Other specified counseling: Secondary | ICD-10-CM

## 2018-05-26 DIAGNOSIS — N179 Acute kidney failure, unspecified: Secondary | ICD-10-CM | POA: Diagnosis present

## 2018-05-26 DIAGNOSIS — R740 Nonspecific elevation of levels of transaminase and lactic acid dehydrogenase [LDH]: Secondary | ICD-10-CM

## 2018-05-26 DIAGNOSIS — R627 Adult failure to thrive: Secondary | ICD-10-CM | POA: Diagnosis present

## 2018-05-26 DIAGNOSIS — Z79899 Other long term (current) drug therapy: Secondary | ICD-10-CM | POA: Diagnosis not present

## 2018-05-26 DIAGNOSIS — Z66 Do not resuscitate: Secondary | ICD-10-CM | POA: Diagnosis present

## 2018-05-26 DIAGNOSIS — R339 Retention of urine, unspecified: Secondary | ICD-10-CM | POA: Diagnosis present

## 2018-05-26 DIAGNOSIS — C50919 Malignant neoplasm of unspecified site of unspecified female breast: Secondary | ICD-10-CM | POA: Diagnosis not present

## 2018-05-26 DIAGNOSIS — C7931 Secondary malignant neoplasm of brain: Secondary | ICD-10-CM | POA: Diagnosis present

## 2018-05-26 DIAGNOSIS — C7800 Secondary malignant neoplasm of unspecified lung: Secondary | ICD-10-CM | POA: Diagnosis present

## 2018-05-26 DIAGNOSIS — F419 Anxiety disorder, unspecified: Secondary | ICD-10-CM | POA: Diagnosis present

## 2018-05-26 DIAGNOSIS — Z515 Encounter for palliative care: Secondary | ICD-10-CM | POA: Diagnosis present

## 2018-05-26 DIAGNOSIS — R7401 Elevation of levels of liver transaminase levels: Secondary | ICD-10-CM

## 2018-05-26 LAB — MRSA PCR SCREENING: MRSA by PCR: NEGATIVE

## 2018-05-26 LAB — CBC WITH DIFFERENTIAL/PLATELET
Abs Immature Granulocytes: 0 10*3/uL (ref 0.00–0.07)
Basophils Absolute: 0 10*3/uL (ref 0.0–0.1)
Basophils Relative: 0 %
EOS PCT: 0 %
Eosinophils Absolute: 0 10*3/uL (ref 0.0–0.5)
HCT: 36.3 % (ref 36.0–46.0)
Hemoglobin: 11.7 g/dL — ABNORMAL LOW (ref 12.0–15.0)
Immature Granulocytes: 0 %
LYMPHS PCT: 21 %
Lymphs Abs: 0.1 10*3/uL — ABNORMAL LOW (ref 0.7–4.0)
MCH: 30.5 pg (ref 26.0–34.0)
MCHC: 32.2 g/dL (ref 30.0–36.0)
MCV: 94.5 fL (ref 80.0–100.0)
Monocytes Absolute: 0 10*3/uL — ABNORMAL LOW (ref 0.1–1.0)
Monocytes Relative: 2 %
Neutro Abs: 0.4 10*3/uL — ABNORMAL LOW (ref 1.7–7.7)
Neutrophils Relative %: 77 %
Platelets: 43 10*3/uL — ABNORMAL LOW (ref 150–400)
RBC: 3.84 MIL/uL — ABNORMAL LOW (ref 3.87–5.11)
RDW: 14.8 % (ref 11.5–15.5)
WBC: 0.5 10*3/uL — CL (ref 4.0–10.5)
nRBC: 0 % (ref 0.0–0.2)

## 2018-05-26 LAB — COMPREHENSIVE METABOLIC PANEL
ALT: 665 U/L — ABNORMAL HIGH (ref 0–44)
AST: 684 U/L — ABNORMAL HIGH (ref 15–41)
Albumin: 3 g/dL — ABNORMAL LOW (ref 3.5–5.0)
Alkaline Phosphatase: 255 U/L — ABNORMAL HIGH (ref 38–126)
Anion gap: 10 (ref 5–15)
BUN: 61 mg/dL — ABNORMAL HIGH (ref 8–23)
CALCIUM: 8.9 mg/dL (ref 8.9–10.3)
CO2: 24 mmol/L (ref 22–32)
Chloride: 100 mmol/L (ref 98–111)
Creatinine, Ser: 1.27 mg/dL — ABNORMAL HIGH (ref 0.44–1.00)
GFR calc Af Amer: 46 mL/min — ABNORMAL LOW (ref >60)
GFR calc non Af Amer: 40 mL/min — ABNORMAL LOW (ref >60)
Glucose, Bld: 95 mg/dL (ref 70–99)
Potassium: 4.3 mmol/L (ref 3.5–5.1)
SODIUM: 134 mmol/L — AB (ref 135–145)
TOTAL PROTEIN: 5.7 g/dL — AB (ref 6.5–8.1)
Total Bilirubin: 1.3 mg/dL — ABNORMAL HIGH (ref 0.3–1.2)

## 2018-05-26 LAB — DIC (DISSEMINATED INTRAVASCULAR COAGULATION)PANEL
D-Dimer, Quant: 6.32 ug/mL-FEU — ABNORMAL HIGH (ref 0.00–0.50)
Fibrinogen: 182 mg/dL — ABNORMAL LOW (ref 210–475)
INR: 1.03
Prothrombin Time: 13.4 seconds (ref 11.4–15.2)
Smear Review: NONE SEEN
aPTT: 26 seconds (ref 24–36)

## 2018-05-26 LAB — CORTISOL: Cortisol, Plasma: 6.1 ug/dL

## 2018-05-26 LAB — DIC (DISSEMINATED INTRAVASCULAR COAGULATION) PANEL: PLATELETS: 33 10*3/uL — AB (ref 150–400)

## 2018-05-26 LAB — LACTIC ACID, PLASMA
Lactic Acid, Venous: 1.7 mmol/L (ref 0.5–1.9)
Lactic Acid, Venous: 1.1 mmol/L (ref 0.5–1.9)

## 2018-05-26 LAB — TSH: TSH: 2.311 u[IU]/mL (ref 0.350–4.500)

## 2018-05-26 LAB — LIPASE, BLOOD: LIPASE: 3461 U/L — AB (ref 11–51)

## 2018-05-26 MED ORDER — SODIUM CHLORIDE 0.9 % IV SOLN
2.0000 g | Freq: Once | INTRAVENOUS | Status: AC
  Administered 2018-05-26: 2 g via INTRAVENOUS
  Filled 2018-05-26: qty 2

## 2018-05-26 MED ORDER — ACETAMINOPHEN 650 MG RE SUPP: 650.0000 mg | Freq: Four times a day (QID) | RECTAL | Status: DC | PRN

## 2018-05-26 MED ORDER — FENTANYL CITRATE (PF) 100 MCG/2ML IJ SOLN
25.0000 ug | Freq: Once | INTRAMUSCULAR | Status: AC
  Administered 2018-05-26: 25 ug via INTRAVENOUS
  Filled 2018-05-26: qty 2

## 2018-05-26 MED ORDER — METRONIDAZOLE IN NACL 5-0.79 MG/ML-% IV SOLN
500.0000 mg | Freq: Three times a day (TID) | INTRAVENOUS | Status: DC
  Administered 2018-05-26 – 2018-05-27 (×3): 500 mg via INTRAVENOUS
  Filled 2018-05-26 (×3): qty 100

## 2018-05-26 MED ORDER — ONDANSETRON HCL 4 MG/2ML IJ SOLN: 4.0000 mg | Freq: Four times a day (QID) | INTRAMUSCULAR | Status: DC | PRN

## 2018-05-26 MED ORDER — ACETAMINOPHEN 325 MG PO TABS: 650.0000 mg | ORAL_TABLET | Freq: Four times a day (QID) | ORAL | Status: DC | PRN

## 2018-05-26 MED ORDER — SODIUM CHLORIDE 0.9 % IV SOLN
Freq: Once | INTRAVENOUS | Status: AC
  Administered 2018-05-26: 19:00:00 via INTRAVENOUS

## 2018-05-26 MED ORDER — DEXAMETHASONE 2 MG PO TABS
2.0000 mg | ORAL_TABLET | Freq: Every day | ORAL | Status: DC
  Administered 2018-05-27: 2 mg via ORAL
  Filled 2018-05-26: qty 1

## 2018-05-26 MED ORDER — HYDROCORTISONE NA SUCCINATE PF 100 MG IJ SOLR
50.0000 mg | Freq: Three times a day (TID) | INTRAMUSCULAR | Status: DC
  Administered 2018-05-27 (×2): 50 mg via INTRAVENOUS
  Filled 2018-05-26 (×2): qty 2

## 2018-05-26 MED ORDER — SODIUM CHLORIDE 0.9 % IV BOLUS
500.0000 mL | Freq: Once | INTRAVENOUS | Status: AC
  Administered 2018-05-26: 500 mL via INTRAVENOUS

## 2018-05-26 MED ORDER — FENTANYL CITRATE (PF) 100 MCG/2ML IJ SOLN
25.0000 ug | INTRAMUSCULAR | Status: DC | PRN
  Administered 2018-05-26 – 2018-05-27 (×6): 25 ug via INTRAVENOUS
  Filled 2018-05-26 (×6): qty 2

## 2018-05-26 MED ORDER — ONDANSETRON HCL 4 MG/2ML IJ SOLN
4.0000 mg | Freq: Once | INTRAMUSCULAR | Status: AC
  Administered 2018-05-26: 4 mg via INTRAVENOUS
  Filled 2018-05-26: qty 2

## 2018-05-26 MED ORDER — HYDROMORPHONE HCL 1 MG/ML IJ SOLN: 0.5000 mg | INTRAMUSCULAR | Status: DC | PRN

## 2018-05-26 MED ORDER — PANTOPRAZOLE SODIUM 40 MG PO TBEC
40.0000 mg | DELAYED_RELEASE_TABLET | Freq: Every day | ORAL | Status: DC
  Administered 2018-05-27: 40 mg via ORAL
  Filled 2018-05-26: qty 1

## 2018-05-26 MED ORDER — SODIUM CHLORIDE 0.9 % IV BOLUS
1000.0000 mL | Freq: Once | INTRAVENOUS | Status: AC
  Administered 2018-05-26: 1000 mL via INTRAVENOUS

## 2018-05-26 MED ORDER — MORPHINE SULFATE (PF) 4 MG/ML IV SOLN: 4.0000 mg | Freq: Once | INTRAVENOUS | Status: DC

## 2018-05-26 MED ORDER — HYDROCORTISONE NA SUCCINATE PF 100 MG IJ SOLR
100.0000 mg | Freq: Once | INTRAMUSCULAR | Status: AC
  Administered 2018-05-26: 100 mg via INTRAVENOUS
  Filled 2018-05-26: qty 2

## 2018-05-26 MED ORDER — VANCOMYCIN HCL IN DEXTROSE 1-5 GM/200ML-% IV SOLN
1000.0000 mg | Freq: Once | INTRAVENOUS | Status: AC
  Administered 2018-05-26: 1000 mg via INTRAVENOUS
  Filled 2018-05-26: qty 200

## 2018-05-26 MED ORDER — ONDANSETRON HCL 4 MG PO TABS: 4.0000 mg | ORAL_TABLET | Freq: Four times a day (QID) | ORAL | Status: DC | PRN

## 2018-05-26 MED ORDER — TBO-FILGRASTIM 300 MCG/0.5ML ~~LOC~~ SOSY
300.0000 ug | PREFILLED_SYRINGE | Freq: Every day | SUBCUTANEOUS | Status: DC
  Administered 2018-05-26: 300 ug via SUBCUTANEOUS
  Filled 2018-05-26 (×2): qty 0.5

## 2018-05-26 MED ORDER — ANASTROZOLE 1 MG PO TABS
1.0000 mg | ORAL_TABLET | Freq: Every day | ORAL | Status: DC
  Filled 2018-05-26: qty 1

## 2018-05-26 MED ORDER — SODIUM CHLORIDE 0.9 % IV SOLN
1.0000 g | Freq: Two times a day (BID) | INTRAVENOUS | Status: DC
  Administered 2018-05-27: 1 g via INTRAVENOUS
  Filled 2018-05-26 (×3): qty 1

## 2018-05-26 NOTE — ED Notes (Signed)
Bed: SZ56 Expected date:  Expected time:  Means of arrival:  Comments: 78 yo abd pain

## 2018-05-26 NOTE — Progress Notes (Signed)
MEDICATION RELATED CONSULT NOTE - FOLLOW UP   Pharmacy Consult for Granix  Indication: Neutropenia  Patient Measurements: Height: 5' (152.4 cm) Weight: 80 lb (36.3 kg) IBW/kg (Calculated) : 45.5   Vital Signs: Temp: 94.5 F (34.7 C) (02/22 1819) Temp Source: Oral (02/22 1819) BP: 101/61 (02/22 2030) Pulse Rate: 73 (02/22 2030)  Labs: Recent Labs    05/11/2018 1609  WBC 0.5*  HGB 11.7*  HCT 36.3  PLT 43*  CREATININE 1.27*  ALBUMIN 3.0*  PROT 5.7*  AST 684*  ALT 665*  ALKPHOS 255*  BILITOT 1.3*   Estimated Creatinine Clearance: 20.6 mL/min (A) (by C-G formula based on SCr of 1.27 mg/dL (H)).   Medications:  Scheduled:  . [START ON 05/27/2018] anastrozole  1 mg Oral Daily  . [START ON 05/27/2018] dexamethasone  2 mg Oral Daily  . [START ON 05/27/2018] hydrocortisone sod succinate (SOLU-CORTEF) inj  50 mg Intravenous Q8H  . [START ON 05/27/2018] pantoprazole  40 mg Oral Daily   Infusions:  . [START ON 05/27/2018] ceFEPime (MAXIPIME) IV    . metronidazole 500 mg (05/23/2018 1921)  . vancomycin      Assessment: 2 yoF admitted on 2/22 with abdominal pain.  PMH significant for metastatic (bone, liver, lung, scalp) breast cancer recently diagnosed on 04/13/2018.  She is currently taking anastrozole(started 04/17/2018) and palbociclib(started 05/07/2018).  Pharmacy has been consulted to dose Granix for neutropenia per Dr. Alen Blew.  Today, 05/18/2018:  WBC 0.5, ANC 0.4  Goal of Therapy:  ANC  Plan:   Granix 5 mcg/kg/day (dose rounded to nearest vial size, 300 mcg) daily at 1800.  Follow up Hematology/oncology plans and ANC goal.  Gretta Arab PharmD, BCPS Pager [phone] 05/14/2018 8:45 PM

## 2018-05-26 NOTE — ED Notes (Signed)
Date and time results received: 05/24/2018 1801 (use smartphrase ".now" to insert current time)  Test: WBC Critical Value: 0.5  Name of Provider Notified: Dr. Duffy Bruce  Orders Received? Or Actions Taken?: Actions Taken: notified WBC 0.5 to Dr Ellender Hose.

## 2018-05-26 NOTE — Progress Notes (Signed)
Pharmacy Antibiotic Note  Morgan Morrow is a 78 y.o. female admitted on 05/17/2018 with febrile neutropenia.  Pharmacy has been consulted for Cefepime dosing.  Plan: Cefepime 2g IV x1 dose, followed by 1g IV q12h Follow up renal fxn, culture results, and clinical course.   Height: 5' (152.4 cm) Weight: 80 lb (36.3 kg) IBW/kg (Calculated) : 45.5  Temp (24hrs), Avg:94 F (34.4 C), Min:93.5 F (34.2 C), Max:94.5 F (34.7 C)  Recent Labs  Lab 05/27/2018 1609  WBC 0.5*  CREATININE 1.27*  LATICACIDVEN 1.7    Estimated Creatinine Clearance: 20.6 mL/min (A) (by C-G formula based on SCr of 1.27 mg/dL (H)).    No Known Allergies  Thank you for allowing pharmacy to be a part of this patient's care.  Gretta Arab PharmD, BCPS Pager [phone] 05/20/2018 8:34 PM

## 2018-05-26 NOTE — ED Provider Notes (Addendum)
[facility] DEPT Provider Note   CSN: [id number] Arrival date & time: 05/22/2018  1557    History   Chief Complaint Chief Complaint  Patient presents with  . Abdominal Pain    HPI Morgan Morrow is a 78 y.o. female.     HPI  78 yo F with PMHx below here with abd pain. Pt reports that her sx began fairly abruptly this morning, as acute, severe, lower abd pain with fullness. She's had associated aching, throbbing, cramp like sx with associated nausea. No vomiting or diarrhea. No urinary sx. Pt reports generalized weakness as well, which has been a chronic issue x months with associated significant weight loss. She's had very poor appetite. Denies any recent constipation or change in BMs. No fevers that she is aware of. No alleviating factors.   Past Medical History:  Diagnosis Date  . Allergy   . Hypertension     Patient Active Problem List   Diagnosis Date Noted  . Febrile neutropenia ([place]) 05/19/2018  . Severe acute pancreatitis 05/21/2018  . Transaminitis 05/17/2018  . Bone metastases ([place]) 05/04/2018  . Lung metastases ([place]) 05/04/2018  . Liver metastases ([place]) 05/04/2018  . Cerebral edema ([facility])   . Altered mental status 04/25/2018  . Malnutrition of moderate degree 04/17/2018  . Breast mass, probable cancer 04/15/2018  . Scalp mass, probable breast cancer metastasis 04/15/2018  . Stercoral proctocolitis of rectum 04/15/2018  . Metastatic breast cancer ([place]) 04/14/2018  . FTT (failure to thrive) in adult 04/14/2018  . Anemia 04/14/2018  . Murmur, cardiac 04/14/2018  . Diarrhea   . Fecal impaction in rectum ([place])   . Hypokalemia   . Cachexia ([place]) 04/13/2018  . Hematochezia 04/13/2018  . Community acquired pneumonia 04/13/2018  . Constipation, chronic 04/13/2018  . 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 Cough 11/01/2010  . HYPERGLYCEMIA 10/23/2009  . PAP SMEAR, ABNORMAL 02/10/2007  . HEMOCCULT POSITIVE STOOL 02/06/2007  . Essential  hypertension 10/23/2006  . ALLERGIC RHINITIS 10/23/2006    Past Surgical History:  Procedure Laterality Date  . BREAST BIOPSY Right 04/16/2018   Procedure: RIGHT BREAST BIOPSY AND RIGHT POSTERIOR  SCALP BIOPSY AND TOP SCALP BIOPSY;  Surgeon: Johnathan Hausen, MD;  Location: [facility] ORS;  Service: General;  Laterality: Right;  . NO PAST SURGERIES       OB History   No obstetric history on file.      Home Medications    Prior to Admission medications   Medication Sig Start Date End Date Taking? Authorizing Provider  acetaminophen (TYLENOL) 500 MG tablet Take 500 mg by mouth every 6 (six) hours as needed for mild pain.   Yes [provider]  amLODipine (NORVASC) 5 MG tablet Take 5 mg by mouth daily.   Yes [provider]  anastrozole (ARIMIDEX) 1 MG tablet Take 1 mg by mouth daily.   Yes [provider]  dexamethasone (DECADRON) 2 MG tablet Take 2 mg by mouth daily.   Yes [provider]  feeding supplement, ENSURE ENLIVE, (ENSURE ENLIVE) LIQD Take 237 mLs by mouth 2 (two) times daily between meals. 04/20/18  Yes [name], MD  lisinopril (PRINIVIL,ZESTRIL) 20 MG tablet Take 20 mg by mouth daily. 04/23/18  Yes [provider]  palbociclib (IBRANCE) 125 MG capsule Take 1 capsule (125 mg total) by mouth daily with breakfast. Take whole with food. Take for 21 days on, 7 days off, repeat every 28 days. Patient taking differently: Take 125 mg by  mouth daily after breakfast.  05/04/18  Yes [name], [name], MD  pantoprazole (PROTONIX) 40 MG tablet Take 1 tablet (40 mg total) by mouth daily. 04/20/18 04/20/19 Yes [name], MD  simethicone (MYLICON) 80 MG chewable tablet Chew 2 tablets (160 mg total) by mouth 4 (four) times daily as needed for flatulence. Patient taking differently: Chew 160 mg by mouth every 6 (six) hours as needed for flatulence.  04/30/18  Yes [name], MD    Family History Family History  Problem Relation  Age of Onset  . Hypertension Mother   . Depression Mother   . Hypertension Father   . Arthritis Father     Social History Social History   Tobacco Use  . Smoking status: Never Smoker  . Smokeless tobacco: Never Used  Substance Use Topics  . Alcohol use: No  . Drug use: No     Allergies   Patient has no known allergies.   Review of Systems Review of Systems  Constitutional: Positive for fatigue. Negative for chills and fever.  HENT: Negative for congestion and rhinorrhea.   Eyes: Negative for visual disturbance.  Respiratory: Negative for cough, shortness of breath and wheezing.   Cardiovascular: Negative for chest pain and leg swelling.  Gastrointestinal: Positive for abdominal pain and nausea. Negative for diarrhea and vomiting.  Genitourinary: Negative for dysuria and flank pain.  Musculoskeletal: Negative for neck pain and neck stiffness.  Skin: Negative for rash and wound.  Allergic/Immunologic: Negative for immunocompromised state.  Neurological: Positive for weakness. Negative for syncope and headaches.  All other systems reviewed and are negative.    Physical Exam Updated Vital Signs BP 129/61   Pulse 66   Temp (!) 97.5 F (36.4 C) (Oral)   Resp (!) 23   Ht 5' (1.524 m)   Wt 36.3 kg   SpO2 100%   BMI 15.62 kg/m   Physical Exam Vitals signs and nursing note reviewed.  Constitutional:      Appearance: She is well-developed.     Comments: Cachectic, markedly chronically ill-appearing  HENT:     Head: Normocephalic and atraumatic.     Mouth/Throat:     Mouth: Mucous membranes are dry.  Eyes:     Conjunctiva/sclera: Conjunctivae normal.  Neck:     Musculoskeletal: Neck supple.  Cardiovascular:     Rate and Rhythm: Normal rate and regular rhythm.     Heart sounds: Normal heart sounds. No murmur. No friction rub.  Pulmonary:     Effort: Pulmonary effort is normal. No respiratory distress.     Breath sounds: Normal breath sounds. No wheezing or  rales.  Abdominal:     General: Abdomen is flat. Bowel sounds are normal. There is no distension.     Palpations: Abdomen is soft.     Tenderness: There is abdominal tenderness in the periumbilical area and suprapubic area. There is guarding. There is no rebound.     Comments: Mild lower abd fullness  Skin:    General: Skin is warm.     Capillary Refill: Capillary refill takes less than 2 seconds.  Neurological:     Mental Status: She is alert and oriented to person, place, and time.     Motor: No abnormal muscle tone.      ED Treatments / Results  Labs (all labs ordered are listed, but only abnormal results are displayed) Labs Reviewed  CBC WITH DIFFERENTIAL/PLATELET - Abnormal; Notable for the following components:  Result Value   WBC 0.5 (*)    RBC 3.84 (*)    Hemoglobin 11.7 (*)    Platelets 43 (*)    Neutro Abs 0.4 (*)    Lymphs Abs 0.1 (*)    Monocytes Absolute 0.0 (*)    All other components within normal limits  COMPREHENSIVE METABOLIC PANEL - Abnormal; Notable for the following components:   Sodium 134 (*)    BUN 61 (*)    Creatinine, Ser 1.27 (*)    Total Protein 5.7 (*)    Albumin 3.0 (*)    AST 684 (*)    ALT 665 (*)    Alkaline Phosphatase 255 (*)    Total Bilirubin 1.3 (*)    GFR calc non Af Amer 40 (*)    GFR calc Af Amer 46 (*)    All other components within normal limits  LIPASE, BLOOD - Abnormal; Notable for the following components:   Lipase 3,461 (*)    All other components within normal limits  DIC (DISSEMINATED INTRAVASCULAR COAGULATION) PANEL - Abnormal; Notable for the following components:   Fibrinogen 182 (*)    D-Dimer, Quant 6.32 (*)    Platelets 33 (*)    All other components within normal limits  CULTURE, BLOOD (ROUTINE X 2)  CULTURE, BLOOD (ROUTINE X 2)  MRSA PCR SCREENING  LACTIC ACID, PLASMA  LACTIC ACID, PLASMA  TSH  CORTISOL  URINALYSIS, ROUTINE W REFLEX MICROSCOPIC  CBC  COMPREHENSIVE METABOLIC PANEL     EKG None  Radiology Ct Abdomen Pelvis Wo Contrast  Result Date: 05/13/2018 CLINICAL DATA:  Metastatic breast cancer. Generalized weakness and generalized abdominal pain. EXAM: CT ABDOMEN AND PELVIS WITHOUT CONTRAST TECHNIQUE: Multidetector CT imaging of the abdomen and pelvis was performed following the standard protocol without IV contrast. COMPARISON:  04/13/2018 CT abdomen/pelvis. FINDINGS: Lower chest: Previously visualized extensive patchy nodularity at both lung bases on 04/13/2018 CT abdomen study has decreased. For example, a 5 mm medial basilar right lower lobe nodule (series 4/image 23), decreased from 7 mm. Basilar left lower lobe 7 mm nodule (series 4/image 18), decreased from 10 mm. Peripheral right lower lobe 7 mm nodule (series 4/image 9), decreased from 10 mm. Coronary atherosclerosis. Hepatobiliary: Normal liver size. Previously visualized hypodense liver masses are less conspicuous on today's noncontrast CT, however appear stable to mildly decreased. For example, a superior right liver 0.9 cm lesion (series 2/image 9), slightly decreased from 1.1 cm. Anterior inferior right liver 1.8 cm lesion (series 2/image 20), stable. Posterior inferior right liver 1.0 cm lesion (series 2/image 17), slightly decreased from 1.2 cm. No appreciable new liver lesions. Cholelithiasis. No gallbladder distention. No gallbladder wall thickening. No biliary ductal dilatation. Pancreas: New diffuse thickening of the pancreas with diffuse peripancreatic fat stranding and peripancreatic fluid, compatible with acute pancreatitis. Inflammatory fluid extends into the bilateral anterior paranephric retroperitoneal spaces. No pancreatic parenchymal gas. Spleen: Normal size. No mass. Adrenals/Urinary Tract: Normal adrenals. No hydronephrosis. No renal stones. Isodense subcentimeter renal cortical lesion in the lateral upper left kidney is too small to characterize and is unchanged. No new contour deforming renal  lesions. Normal nondistended bladder. Stomach/Bowel: Normal non-distended stomach. Normal caliber small bowel with no small bowel wall thickening. Appendix not discretely visualized. Mild sigmoid diverticulosis. Moderate gaseous dilatation of the large bowel, most prominent in the cecum and rectum. No definite large bowel wall thickening. Vascular/Lymphatic: Atherosclerotic nonaneurysmal abdominal aorta. No pathologically enlarged lymph nodes in the abdomen or pelvis. Reproductive: Grossly normal uterus.  No adnexal mass. Other: No free air. Small volume ascites. No focal fluid collection. Musculoskeletal: Widespread patchy sclerotic osseous metastatic disease throughout the visualized axial and proximal appendicular skeleton, not appreciably changed. Chronic moderate L1 vertebral compression fracture. IMPRESSION: 1. Acute pancreatitis, which appears severe on this noncontrast scan, which precludes assessment for pancreatic necrosis. IV contrast was not administered due to acute renal failure. 2. Small volume ascites. 3. Moderate gaseous dilatation of the large bowel compatible with adynamic ileus. No bowel obstruction. 4. Widespread pulmonary nodularity at the lung bases, improved from 04/13/2018 CT. 5. Liver masses are stable to mildly decreased. 6. Widespread patchy sclerotic osseous metastatic disease throughout the visualized skeleton, unchanged. 7. Cholelithiasis. 8.  Aortic Atherosclerosis (ICD10-I70.0). Electronically Signed   By: Ilona Sorrel M.D.   On: 05/28/2018 19:27   Dg Abdomen Acute W/chest  Result Date: 05/13/2018 CLINICAL DATA:  Abdominal pain EXAM: DG ABDOMEN ACUTE W/ 1V CHEST COMPARISON:  CT 04/14/2018 FINDINGS: Heart is normal size. No confluent airspace opacities or effusions. Old right lateral 8th rib fracture, stable. Mild gaseous distention of the colon may reflect mild ileus. No evidence for bowel obstruction. No organomegaly or free air. Scoliosis and degenerative changes in the lumbar  spine. IMPRESSION: Gaseous distention of the colon diffusely suggests ileus. No evidence of bowel obstruction or free air. No acute cardiopulmonary disease. Electronically Signed   By: Rolm Baptise M.D.   On: 05/09/2018 18:59    Procedures .Critical Care Performed by: Duffy Bruce, MD Authorized by: Duffy Bruce, MD   Critical care provider statement:    Critical care time (minutes):  75   Critical care time was exclusive of:  Separately billable procedures and treating other patients and teaching time   Critical care was necessary to treat or prevent imminent or life-threatening deterioration of the following conditions:  Cardiac failure, circulatory failure and sepsis   Critical care was time spent personally by me on the following activities:  Development of treatment plan with patient or surrogate, discussions with consultants, evaluation of patient's response to treatment, examination of patient, obtaining history from patient or surrogate, ordering and performing treatments and interventions, ordering and review of laboratory studies, ordering and review of radiographic studies, pulse oximetry, re-evaluation of patient's condition and review of old charts   I assumed direction of critical care for this patient from another provider in my specialty: no     (including critical care time)  Medications Ordered in ED Medications  metroNIDAZOLE (FLAGYL) IVPB 500 mg (500 mg Intravenous New Bag/Given 05/31/2018 1921)  dexamethasone (DECADRON) tablet 2 mg (has no administration in time range)  anastrozole (ARIMIDEX) tablet 1 mg (has no administration in time range)  pantoprazole (PROTONIX) EC tablet 40 mg (has no administration in time range)  hydrocortisone sodium succinate (SOLU-CORTEF) 100 MG injection 50 mg (has no administration in time range)  acetaminophen (TYLENOL) tablet 650 mg (has no administration in time range)    Or  acetaminophen (TYLENOL) suppository 650 mg (has no  administration in time range)  ondansetron (ZOFRAN) tablet 4 mg (has no administration in time range)    Or  ondansetron (ZOFRAN) injection 4 mg (has no administration in time range)  ceFEPIme (MAXIPIME) 1 g in sodium chloride 0.9 % 100 mL IVPB (has no administration in time range)  fentaNYL (SUBLIMAZE) injection 25 mcg (25 mcg Intravenous Given 05/08/2018 2157)  Tbo-Filgrastim (GRANIX) injection 300 mcg (300 mcg Subcutaneous Given 05/25/2018 2157)  sodium chloride 0.9 % bolus 1,000 mL (0 mLs Intravenous Stopped  05/18/2018 1820)  ondansetron (ZOFRAN) injection 4 mg (4 mg Intravenous Given 05/29/2018 1657)  0.9 %  sodium chloride infusion ( Intravenous New Bag/Given (Non-Interop) 05/11/2018 1916)  fentaNYL (SUBLIMAZE) injection 25 mcg (25 mcg Intravenous Given 05/13/2018 1654)  fentaNYL (SUBLIMAZE) injection 25 mcg (25 mcg Intravenous Given 05/25/2018 1807)  ceFEPIme (MAXIPIME) 2 g in sodium chloride 0.9 % 100 mL IVPB (0 g Intravenous Stopped 05/17/2018 1848)  vancomycin (VANCOCIN) IVPB 1000 mg/200 mL premix (1,000 mg Intravenous New Bag/Given 05/27/2018 2055)  sodium chloride 0.9 % bolus 500 mL (0 mLs Intravenous Stopped 05/16/2018 1921)  hydrocortisone sodium succinate (SOLU-CORTEF) 100 MG injection 100 mg (100 mg Intravenous Given 05/07/2018 1916)     Initial Impression / Assessment and Plan / ED Course  I have reviewed the triage vital signs and the nursing notes.  Pertinent labs & imaging results that were available during my care of the patient were reviewed by me and considered in my medical decision making (see chart for details).  Clinical Course as of May 27 2319  Sat May 26, 2018  1643 78 yo F here w/ diffuse abd pain. Pt cachectic, hypothermic, ill-appearing on arrival. Abdomen soft though tender throughout. Will check labs, including LA, also cortisol/TSH. She has had significant recent weight loss though I suspect this is cancer-related. BAIR hugger ordered, warm IVF, and initial fluid bolus.   [CI]  1742 BP  stable. Improving w/ BAIR hugger, warm fluids. Labs all pending.   [CI]  1803 Labs show marked pancytopenia, with neutropenia. Sepsis protocol activated, broad spectrum ABX started. CT pending.   [CI]  1831 Labs show AKI, likely pre-renal given BUN/Cr ratio, along with marked transaminitis and elevated ALkP. Concern for cholangitis, hepatitis, new liver mets, profound sepsis. I've added on Hydrocortisone as pt has intermittently been on Decadron.    [CI]    Clinical Course User Index [CI] Duffy Bruce, MD       Labs, imaging c/f severe pancreatitis, possible necrosis. D/w Dr. Michail Sermon of [facility] - will admit for IV ABX, will be seen in AM. Of note, CBD is not dilated on CT. Admit to medicine. Pt improving clinically, BP remains stable, not requiring pressors.  Final Clinical Impressions(s) / ED Diagnoses   Final diagnoses:  Other acute pancreatitis, unspecified complication status  SIRS (systemic inflammatory response syndrome) ([place])  AKI (acute kidney injury) ([place])  Transaminitis    ED Discharge Orders    None       Duffy Bruce, MD 05/20/2018 2320    Duffy Bruce, MD 05/19/2018 2321

## 2018-05-26 NOTE — ED Notes (Signed)
ED TO INPATIENT HANDOFF REPORT  Name/Age/Gender Morgan Morrow 78 y.o. female  Code Status    Code Status Orders  (From admission, onward)         Start     Ordered   05/10/2018 2023  Do not attempt resuscitation (DNR)  Continuous    Question Answer Comment  In the event of cardiac or respiratory ARREST Do not call a "code blue"   In the event of cardiac or respiratory ARREST Do not perform Intubation, CPR, defibrillation or ACLS   In the event of cardiac or respiratory ARREST Use medication by any route, position, wound care, and other measures to relive pain and suffering. May use oxygen, suction and manual treatment of airway obstruction as needed for comfort.      05/09/2018 2025        Code Status History    Date Active Date Inactive Code Status Order ID Comments User Context   04/25/2018 9562 04/30/2018 1804 Full Code [id number]  Reubin Milan, MD ED   04/13/2018 2130 04/20/2018 2232 Full Code [id number]  Wouk, Ailene Rud, MD ED      Home/SNF/Other Home  Chief Complaint Abd. Pain  Level of Care/Admitting Diagnosis ED Disposition    ED Disposition Condition [facility] Area: [place] [100102]  Level of Care: Stepdown [14]  Admit to SDU based on following criteria: Hemodynamic compromise or significant risk of instability:  Patient requiring short term acute titration and management of vasoactive drips, and invasive monitoring (i.e., CVP and Arterial line).  Admit to SDU based on following criteria: Other see comments  Comments: bair hugger  Diagnosis: Febrile neutropenia [facility]) [[id number]]  Admitting Physician: Doreatha Massed  Attending Physician: Etta Quill [phone]  Estimated length of stay: past midnight tomorrow  Certification:: I certify this patient will need inpatient services for at least 2 midnights  PT Class (Do Not Modify): Inpatient [101]  PT Acc Code (Do Not Modify): Private [1]       Medical  History Past Medical History:  Diagnosis Date  . Allergy   . Hypertension     Allergies No Known Allergies  IV Location/Drains/Wounds Patient Lines/Drains/Airways Status   Active Line/Drains/Airways    Name:   Placement date:   Placement time:   Site:   Days:   Peripheral IV 05/09/2018 Left Antecubital   05/19/2018    -    Antecubital   less than 1   Incision (Closed) 04/16/18 Breast   04/16/18    1418     40   Pressure Injury 04/14/18 Stage II -  Partial thickness loss of dermis presenting as a shallow open ulcer with a red, pink wound bed without slough.   04/14/18    0900     42          Labs/Imaging Results for orders placed or performed during the hospital encounter of 05/10/2018 (from the past 48 hour(s))  CBC with Differential     Status: Abnormal   Collection Time: 05/31/2018  4:09 PM  Result Value Ref Range   WBC 0.5 (LL) 4.0 - 10.5 K/uL    Comment: This critical result has verified and been called to JESSEE,B by Newman Nickels on 02 22 2020 at 1750, and has been read back. CRITICAL VERIFIED   RBC 3.84 (L) 3.87 - 5.11 MIL/uL   Hemoglobin 11.7 (L) 12.0 - 15.0 g/dL   HCT 36.3 36.0 - 46.0 %  MCV 94.5 80.0 - 100.0 fL   MCH 30.5 26.0 - 34.0 pg   MCHC 32.2 30.0 - 36.0 g/dL   RDW 14.8 11.5 - 15.5 %   Platelets 43 (L) 150 - 400 K/uL    Comment: REPEATED TO VERIFY PLATELET COUNT CONFIRMED BY SMEAR SPECIMEN CHECKED FOR CLOTS Immature Platelet Fraction may be clinically indicated, consider ordering this additional test [id number]    nRBC 0.0 0.0 - 0.2 %   Neutrophils Relative % 77 %   Neutro Abs 0.4 (L) 1.7 - 7.7 K/uL   Lymphocytes Relative 21 %   Lymphs Abs 0.1 (L) 0.7 - 4.0 K/uL   Monocytes Relative 2 %   Monocytes Absolute 0.0 (L) 0.1 - 1.0 K/uL   Eosinophils Relative 0 %   Eosinophils Absolute 0.0 0.0 - 0.5 K/uL   Basophils Relative 0 %   Basophils Absolute 0.0 0.0 - 0.1 K/uL   RBC Morphology MORPHOLOGY UNREMARKABLE    Immature Granulocytes 0 %   Abs Immature  Granulocytes 0.00 0.00 - 0.07 K/uL    Comment: Performed at [facility], [place] [address], [place], [place] [address]  Comprehensive metabolic panel     Status: Abnormal   Collection Time: 05/07/2018  4:09 PM  Result Value Ref Range   Sodium 134 (L) 135 - 145 mmol/L   Potassium 4.3 3.5 - 5.1 mmol/L   Chloride 100 98 - 111 mmol/L   CO2 24 22 - 32 mmol/L   Glucose, Bld 95 70 - 99 mg/dL   BUN 61 (H) 8 - 23 mg/dL   Creatinine, Ser 1.27 (H) 0.44 - 1.00 mg/dL   Calcium 8.9 8.9 - 10.3 mg/dL   Total Protein 5.7 (L) 6.5 - 8.1 g/dL   Albumin 3.0 (L) 3.5 - 5.0 g/dL   AST 684 (H) 15 - 41 U/L   ALT 665 (H) 0 - 44 U/L   Alkaline Phosphatase 255 (H) 38 - 126 U/L   Total Bilirubin 1.3 (H) 0.3 - 1.2 mg/dL   GFR calc non Af Amer 40 (L) >60 mL/min   GFR calc Af Amer 46 (L) >60 mL/min   Anion gap 10 5 - 15    Comment: Performed at [facility], [place] [address], [place], [place] [address]  Lipase, blood     Status: Abnormal   Collection Time: 05/16/2018  4:09 PM  Result Value Ref Range   Lipase 3,461 (H) 11 - 51 U/L    Comment: RESULTS CONFIRMED BY MANUAL DILUTION Performed at [facility], [place] [address] [place], [place] [address]   Lactic acid, plasma     Status: None   Collection Time: 05/18/2018  4:09 PM  Result Value Ref Range   Lactic Acid, Venous 1.7 0.5 - 1.9 mmol/L    Comment: Performed at [facility], [place] [address] [place], [place] [address]  TSH     Status: None   Collection Time: 05/06/2018  4:16 PM  Result Value Ref Range   TSH 2.311 0.350 - 4.500 uIU/mL    Comment: Performed by a 3rd Generation assay with a functional sensitivity of <=0.01 uIU/mL. Performed at [facility], [place] [address] [place], [place] [address]    Ct Abdomen Pelvis Wo Contrast  Result Date: 05/10/2018 CLINICAL DATA:  Metastatic breast cancer. Generalized weakness and generalized abdominal pain. EXAM: CT ABDOMEN AND  PELVIS WITHOUT CONTRAST TECHNIQUE: Multidetector CT imaging of the abdomen and pelvis was performed following the  standard protocol without IV contrast. COMPARISON:  04/13/2018 CT abdomen/pelvis. FINDINGS: Lower chest: Previously visualized extensive patchy nodularity at both lung bases on 04/13/2018 CT abdomen study has decreased. For example, a 5 mm medial basilar right lower lobe nodule (series 4/image 23), decreased from 7 mm. Basilar left lower lobe 7 mm nodule (series 4/image 18), decreased from 10 mm. Peripheral right lower lobe 7 mm nodule (series 4/image 9), decreased from 10 mm. Coronary atherosclerosis. Hepatobiliary: Normal liver size. Previously visualized hypodense liver masses are less conspicuous on today's noncontrast CT, however appear stable to mildly decreased. For example, a superior right liver 0.9 cm lesion (series 2/image 9), slightly decreased from 1.1 cm. Anterior inferior right liver 1.8 cm lesion (series 2/image 20), stable. Posterior inferior right liver 1.0 cm lesion (series 2/image 17), slightly decreased from 1.2 cm. No appreciable new liver lesions. Cholelithiasis. No gallbladder distention. No gallbladder wall thickening. No biliary ductal dilatation. Pancreas: New diffuse thickening of the pancreas with diffuse peripancreatic fat stranding and peripancreatic fluid, compatible with acute pancreatitis. Inflammatory fluid extends into the bilateral anterior paranephric retroperitoneal spaces. No pancreatic parenchymal gas. Spleen: Normal size. No mass. Adrenals/Urinary Tract: Normal adrenals. No hydronephrosis. No renal stones. Isodense subcentimeter renal cortical lesion in the lateral upper left kidney is too small to characterize and is unchanged. No new contour deforming renal lesions. Normal nondistended bladder. Stomach/Bowel: Normal non-distended stomach. Normal caliber small bowel with no small bowel wall thickening. Appendix not discretely visualized. Mild sigmoid  diverticulosis. Moderate gaseous dilatation of the large bowel, most prominent in the cecum and rectum. No definite large bowel wall thickening. Vascular/Lymphatic: Atherosclerotic nonaneurysmal abdominal aorta. No pathologically enlarged lymph nodes in the abdomen or pelvis. Reproductive: Grossly normal uterus.  No adnexal mass. Other: No free air. Small volume ascites. No focal fluid collection. Musculoskeletal: Widespread patchy sclerotic osseous metastatic disease throughout the visualized axial and proximal appendicular skeleton, not appreciably changed. Chronic moderate L1 vertebral compression fracture. IMPRESSION: 1. Acute pancreatitis, which appears severe on this noncontrast scan, which precludes assessment for pancreatic necrosis. IV contrast was not administered due to acute renal failure. 2. Small volume ascites. 3. Moderate gaseous dilatation of the large bowel compatible with adynamic ileus. No bowel obstruction. 4. Widespread pulmonary nodularity at the lung bases, improved from 04/13/2018 CT. 5. Liver masses are stable to mildly decreased. 6. Widespread patchy sclerotic osseous metastatic disease throughout the visualized skeleton, unchanged. 7. Cholelithiasis. 8.  Aortic Atherosclerosis (ICD10-I70.0). Electronically Signed   By: Ilona Sorrel M.D.   On: 05/06/2018 19:27   Dg Abdomen Acute W/chest  Result Date: 05/10/2018 CLINICAL DATA:  Abdominal pain EXAM: DG ABDOMEN ACUTE W/ 1V CHEST COMPARISON:  CT 04/14/2018 FINDINGS: Heart is normal size. No confluent airspace opacities or effusions. Old right lateral 8th rib fracture, stable. Mild gaseous distention of the colon may reflect mild ileus. No evidence for bowel obstruction. No organomegaly or free air. Scoliosis and degenerative changes in the lumbar spine. IMPRESSION: Gaseous distention of the colon diffusely suggests ileus. No evidence of bowel obstruction or free air. No acute cardiopulmonary disease. Electronically Signed   By: Rolm Baptise M.D.   On: 05/06/2018 18:59    Pending Labs Unresulted Labs (From admission, onward)    Start     Ordered   05/27/18 0500  CBC  Tomorrow morning,   R     05/21/2018 2025   05/27/18 0500  Comprehensive metabolic panel  Tomorrow morning,   R     05/30/2018 2025  05/10/2018 2027  DIC (disseminated intravasc coag) panel  ONCE - STAT,   R     05/09/2018 2026   05/23/2018 1642  Cortisol  Once,   R     05/17/2018 1641   05/05/2018 1616  Blood culture (routine x 2)  BLOOD CULTURE X 2,   STAT     05/28/2018 1616   05/31/2018 1609  Urinalysis, Routine w reflex microscopic  Once,   R     05/13/2018 1608   05/28/2018 1609  Lactic acid, plasma  Now then every 2 hours,   STAT     05/20/2018 1608          Vitals/Pain Today's Vitals   05/18/2018 2000 05/25/2018 2030 05/27/2018 2042 05/09/2018 2100  BP: 104/63 101/61  106/63  Pulse: 71 73  75  Resp: 17 (!) 21  (!) 38  Temp:   97.6 F (36.4 C)   TempSrc:   Oral   SpO2: 99% 97%  96%  Weight:      Height:      PainSc:        Isolation Precautions No active isolations  Medications Medications  metroNIDAZOLE (FLAGYL) IVPB 500 mg (500 mg Intravenous New Bag/Given 05/24/2018 1921)  vancomycin (VANCOCIN) IVPB 1000 mg/200 mL premix (1,000 mg Intravenous New Bag/Given 05/31/2018 2055)  dexamethasone (DECADRON) tablet 2 mg (has no administration in time range)  anastrozole (ARIMIDEX) tablet 1 mg (has no administration in time range)  pantoprazole (PROTONIX) EC tablet 40 mg (has no administration in time range)  hydrocortisone sodium succinate (SOLU-CORTEF) 100 MG injection 50 mg (has no administration in time range)  acetaminophen (TYLENOL) tablet 650 mg (has no administration in time range)    Or  acetaminophen (TYLENOL) suppository 650 mg (has no administration in time range)  ondansetron (ZOFRAN) tablet 4 mg (has no administration in time range)    Or  ondansetron (ZOFRAN) injection 4 mg (has no administration in time range)  ceFEPIme (MAXIPIME) 1 g in sodium  chloride 0.9 % 100 mL IVPB (has no administration in time range)  fentaNYL (SUBLIMAZE) injection 25 mcg (has no administration in time range)  sodium chloride 0.9 % bolus 1,000 mL (0 mLs Intravenous Stopped 05/19/2018 1820)  ondansetron (ZOFRAN) injection 4 mg (4 mg Intravenous Given 05/24/2018 1657)  0.9 %  sodium chloride infusion ( Intravenous New Bag/Given (Non-Interop) 05/19/2018 1916)  fentaNYL (SUBLIMAZE) injection 25 mcg (25 mcg Intravenous Given 05/06/2018 1654)  fentaNYL (SUBLIMAZE) injection 25 mcg (25 mcg Intravenous Given 05/27/2018 1807)  ceFEPIme (MAXIPIME) 2 g in sodium chloride 0.9 % 100 mL IVPB (0 g Intravenous Stopped 05/10/2018 1848)  sodium chloride 0.9 % bolus 500 mL (0 mLs Intravenous Stopped 05/16/2018 1921)  hydrocortisone sodium succinate (SOLU-CORTEF) 100 MG injection 100 mg (100 mg Intravenous Given 05/12/2018 1916)    Mobility walks

## 2018-05-26 NOTE — Progress Notes (Signed)
A consult was received from an ED physician for Vancomycin and Cefepime per pharmacy dosing.  The patient's profile has been reviewed for ht/wt/allergies/indication/available labs.   A one time order has been placed for Cefepime 2g and Vanc 1g.  Further antibiotics/pharmacy consults should be ordered by admitting physician if indicated.                       Thank you,  Gretta Arab PharmD, BCPS Pager [phone] 05/29/2018 6:18 PM

## 2018-05-26 NOTE — H&P (Signed)
History and Physical    Morgan Morrow IHK:[id number] DOB: Jan 03, 1941 DOA: 05/25/2018  PCP: Leighton Ruff, MD  Patient coming from: Home  I have personally briefly reviewed patient's old medical records in [place]  Chief Complaint: Abd pain  HPI: Morgan Morrow is a 78 y.o. 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  Patient was started on Ibrance x20 days ago as well as decadron and anastrozole.  Today she had abrupt onset this morning of severe abd pain and fullness.  No vomiting nor diarrhea.  No urinary symptoms.  Associated generalized weakness and nausea.   ED Course: T 93.5, Severe acute pancreatitis on CT, WBC 0.5, platelets 43, lipase 3461, AST and ALT in the 600s, AKI BUN 61.  Put on cefepime and flagyl.  Hospitalist asked to admit.   Review of Systems: As per HPI otherwise 10 point review of systems negative.   Past Medical History:  Diagnosis Date  . Allergy   . Hypertension     Past Surgical History:  Procedure Laterality Date  . BREAST BIOPSY Right 04/16/2018   Procedure: RIGHT BREAST BIOPSY AND RIGHT POSTERIOR  SCALP BIOPSY AND TOP SCALP BIOPSY;  Surgeon: Johnathan Hausen, MD;  Location: [facility];  Service: General;  Laterality: Right;  . NO PAST SURGERIES       reports that she has never smoked. She has never used smokeless tobacco. She reports that she does not drink alcohol or use drugs.  No Known Allergies  Family History  Problem Relation Age of Onset  . Hypertension Mother   . Depression Mother   . Hypertension Father   . Arthritis Father      Prior to Admission medications   Medication Sig Start Date End Date Taking? Authorizing Provider  acetaminophen (TYLENOL) 500 MG tablet Take 500 mg by mouth every 6 (six) hours as needed for mild pain.   Yes [provider]  amLODipine (NORVASC) 5 MG tablet Take 5 mg by mouth daily.   Yes  [provider]  anastrozole (ARIMIDEX) 1 MG tablet Take 1 mg by mouth daily.   Yes [provider]  dexamethasone (DECADRON) 2 MG tablet Take 2 mg by mouth daily.   Yes [provider]  feeding supplement, ENSURE ENLIVE, (ENSURE ENLIVE) LIQD Take 237 mLs by mouth 2 (two) times daily between meals. 04/20/18  Yes Morgan Morrow., MD  lisinopril (PRINIVIL,ZESTRIL) 20 MG tablet Take 20 mg by mouth daily. 04/23/18  Yes [provider]  palbociclib (IBRANCE) 125 MG capsule Take 1 capsule (125 mg total) by mouth daily with breakfast. Take whole with food. Take for 21 days on, 7 days off, repeat every 28 days. Patient taking differently: Take 125 mg by mouth daily after breakfast.  05/04/18  Yes Morrow, Morgan Dad, MD  pantoprazole (PROTONIX) 40 MG tablet Take 1 tablet (40 mg total) by mouth daily. 04/20/18 04/20/19 Yes Morgan Morrow., MD  simethicone (MYLICON) 80 MG chewable tablet Chew 2 tablets (160 mg total) by mouth 4 (four) times daily as needed for flatulence. Patient taking differently: Chew 160 mg by mouth every 6 (six) hours as needed for flatulence.  04/30/18  Yes Morgan Pour, MD    Physical Exam: Vitals:   05/25/2018 1937 05/20/2018 2000 05/13/2018 2030 05/24/2018 2042  BP: 104/65 104/63 101/61   Pulse: 64 71 73   Resp: 14 17 (!) 21  Temp:    97.6 F (36.4 C)  TempSrc:    Oral  SpO2: 100% 99% 97%   Weight:      Height:        Constitutional: Ill appearing, asleep post fentanyl, cachectic Eyes: PERRL, lids and conjunctivae normal ENMT: Mucous membranes are moist. Posterior pharynx clear of any exudate or lesions.Normal dentition.  Neck: normal, supple, no masses, no thyromegaly Respiratory: clear to auscultation bilaterally, no wheezing, no crackles. Normal respiratory effort. No accessory muscle use.  Cardiovascular: Regular rate and rhythm, no murmurs / rubs / gallops. No extremity edema. 2+ pedal pulses. No carotid bruits.  Abdomen:  TTP Musculoskeletal: no clubbing / cyanosis. No joint deformity upper and lower extremities. Good ROM, no contractures. Normal muscle tone.  Skin: Skin changes about R breast Neurologic: CN 2-12 grossly intact. Sensation intact, DTR normal. Strength 5/5 in all 4.  Psychiatric: Normal judgment and insight. Alert and oriented x 3. Normal mood.    Labs on Admission: I have personally reviewed following labs and imaging studies  CBC: Recent Labs  Lab 05/16/2018 1609  WBC 0.5*  NEUTROABS 0.4*  HGB 11.7*  HCT 36.3  MCV 94.5  PLT 43*   Basic Metabolic Panel: Recent Labs  Lab 05/15/2018 1609  NA 134*  K 4.3  CL 100  CO2 24  GLUCOSE 95  BUN 61*  CREATININE 1.27*  CALCIUM 8.9   GFR: Estimated Creatinine Clearance: 20.6 mL/min (A) (by C-G formula based on SCr of 1.27 mg/dL (H)). Liver Function Tests: Recent Labs  Lab 05/09/2018 1609  AST 684*  ALT 665*  ALKPHOS 255*  BILITOT 1.3*  PROT 5.7*  ALBUMIN 3.0*   Recent Labs  Lab 05/27/2018 1609  LIPASE 3,461*   No results for input(s): AMMONIA in the last 168 hours. Coagulation Profile: No results for input(s): INR, PROTIME in the last 168 hours. Cardiac Enzymes: No results for input(s): CKTOTAL, CKMB, CKMBINDEX, TROPONINI in the last 168 hours. BNP (last 3 results) No results for input(s): PROBNP in the last 8760 hours. HbA1C: No results for input(s): HGBA1C in the last 72 hours. CBG: No results for input(s): GLUCAP in the last 168 hours. Lipid Profile: No results for input(s): CHOL, HDL, LDLCALC, TRIG, CHOLHDL, LDLDIRECT in the last 72 hours. Thyroid Function Tests: Recent Labs    05/29/2018 1616  TSH 2.311   Anemia Panel: No results for input(s): VITAMINB12, FOLATE, FERRITIN, TIBC, IRON, RETICCTPCT in the last 72 hours. Urine analysis:    Component Value Date/Time   COLORURINE STRAW (A) 04/25/2018 1655   APPEARANCEUR HAZY (A) 04/25/2018 1655   LABSPEC 1.009 04/25/2018 1655   PHURINE 8.0 04/25/2018 1655    GLUCOSEU NEGATIVE 04/25/2018 1655   HGBUR NEGATIVE 04/25/2018 [place] 04/25/2018 1655   BILIRUBINUR neg 01/02/2014 1034   KETONESUR 5 (A) 04/25/2018 1655   PROTEINUR NEGATIVE 04/25/2018 1655   UROBILINOGEN 0.2 01/02/2014 1034   NITRITE NEGATIVE 04/25/2018 1655   LEUKOCYTESUR NEGATIVE 04/25/2018 1655    Radiological Exams on Admission: Ct Abdomen Pelvis Wo Contrast  Result Date: 05/30/2018 CLINICAL DATA:  Metastatic breast cancer. Generalized weakness and generalized abdominal pain. EXAM: CT ABDOMEN AND PELVIS WITHOUT CONTRAST TECHNIQUE: Multidetector CT imaging of the abdomen and pelvis was performed following the standard protocol without IV contrast. COMPARISON:  04/13/2018 CT abdomen/pelvis. FINDINGS: Lower chest: Previously visualized extensive patchy nodularity at both lung bases on 04/13/2018 CT abdomen study has decreased. For example, a 5 mm medial basilar right lower lobe nodule (  series 4/image 23), decreased from 7 mm. Basilar left lower lobe 7 mm nodule (series 4/image 18), decreased from 10 mm. Peripheral right lower lobe 7 mm nodule (series 4/image 9), decreased from 10 mm. Coronary atherosclerosis. Hepatobiliary: Normal liver size. Previously visualized hypodense liver masses are less conspicuous on today's noncontrast CT, however appear stable to mildly decreased. For example, a superior right liver 0.9 cm lesion (series 2/image 9), slightly decreased from 1.1 cm. Anterior inferior right liver 1.8 cm lesion (series 2/image 20), stable. Posterior inferior right liver 1.0 cm lesion (series 2/image 17), slightly decreased from 1.2 cm. No appreciable new liver lesions. Cholelithiasis. No gallbladder distention. No gallbladder wall thickening. No biliary ductal dilatation. Pancreas: New diffuse thickening of the pancreas with diffuse peripancreatic fat stranding and peripancreatic fluid, compatible with acute pancreatitis. Inflammatory fluid extends into the bilateral  anterior paranephric retroperitoneal spaces. No pancreatic parenchymal gas. Spleen: Normal size. No mass. Adrenals/Urinary Tract: Normal adrenals. No hydronephrosis. No renal stones. Isodense subcentimeter renal cortical lesion in the lateral upper left kidney is too small to characterize and is unchanged. No new contour deforming renal lesions. Normal nondistended bladder. Stomach/Bowel: Normal non-distended stomach. Normal caliber small bowel with no small bowel wall thickening. Appendix not discretely visualized. Mild sigmoid diverticulosis. Moderate gaseous dilatation of the large bowel, most prominent in the cecum and rectum. No definite large bowel wall thickening. Vascular/Lymphatic: Atherosclerotic nonaneurysmal abdominal aorta. No pathologically enlarged lymph nodes in the abdomen or pelvis. Reproductive: Grossly normal uterus.  No adnexal mass. Other: No free air. Small volume ascites. No focal fluid collection. Musculoskeletal: Widespread patchy sclerotic osseous metastatic disease throughout the visualized axial and proximal appendicular skeleton, not appreciably changed. Chronic moderate L1 vertebral compression fracture. IMPRESSION: 1. Acute pancreatitis, which appears severe on this noncontrast scan, which precludes assessment for pancreatic necrosis. IV contrast was not administered due to acute renal failure. 2. Small volume ascites. 3. Moderate gaseous dilatation of the large bowel compatible with adynamic ileus. No bowel obstruction. 4. Widespread pulmonary nodularity at the lung bases, improved from 04/13/2018 CT. 5. Liver masses are stable to mildly decreased. 6. Widespread patchy sclerotic osseous metastatic disease throughout the visualized skeleton, unchanged. 7. Cholelithiasis. 8.  Aortic Atherosclerosis (ICD10-I70.0). Electronically Signed   By: Ilona Sorrel M.D.   On: 05/08/2018 19:27   Dg Abdomen Acute W/chest  Result Date: 05/15/2018 CLINICAL DATA:  Abdominal pain EXAM: DG ABDOMEN  ACUTE W/ 1V CHEST COMPARISON:  CT 04/14/2018 FINDINGS: Heart is normal size. No confluent airspace opacities or effusions. Old right lateral 8th rib fracture, stable. Mild gaseous distention of the colon may reflect mild ileus. No evidence for bowel obstruction. No organomegaly or free air. Scoliosis and degenerative changes in the lumbar spine. IMPRESSION: Gaseous distention of the colon diffusely suggests ileus. No evidence of bowel obstruction or free air. No acute cardiopulmonary disease. Electronically Signed   By: Rolm Baptise M.D.   On: 05/15/2018 18:59    EKG: Independently reviewed.  Assessment/Plan Principal Problem:   Febrile neutropenia (HCC) Active Problems:   Essential hypertension   Cachexia (HCC)   Metastatic breast cancer (HCC)   Malnutrition of moderate degree   Severe acute pancreatitis   Transaminitis    1. Febrile neutropenia - 1. Now rewarmed with bair hugger 2. BCx pending 3. Cefepime and flagyl 4. Spoke with Dr. Alen Blew: 1. Did recommend that we give Granix, pharmacy to dose. 5. DIC pnl ordered due to thrombocytopenia 1. Though they said RBC morphology on CBC was normal (presumably no  schistiocytes as I would expect with DIC or TTP) 6. Stress dose steroids empirically (given decadron usage), cortisol sent to lab before solucortef ordered according to EDP. 2. Severe acute pancreatitis - 1. ABx as above 2. IVF: NS at 100 cc/hr 3. Pain ctrl with PRN fentanyl 4. Repeat CMP in AM 3. Metastatic BRCA, malnutrition - 1. Pal care consult in AM 2. Family aware that she may not survive this hospitalization. 3. DNR/DNI status reconfirmed 4. HTN - 1. Holding home BP meds  DVT prophylaxis: SCDs - thrombocytopenia Code Status: DNR Family Communication: Family at bedside Disposition Plan: TBD Consults called: Spoke with Dr. Alen Blew on phone Admission status: Admit to inpatient  Severity of Illness: The appropriate patient status for this patient is INPATIENT.  Inpatient status is judged to be reasonable and necessary in order to provide the required intensity of service to ensure the patient's safety. The patient's presenting symptoms, physical exam findings, and initial radiographic and laboratory data in the context of their chronic comorbidities is felt to place them at high risk for further clinical deterioration. Furthermore, it is not anticipated that the patient will be medically stable for discharge from the hospital within 2 midnights of admission. The following factors support the patient status of inpatient.   " The patient's presenting symptoms include Abd pain. " The worrisome physical exam findings include T 93.5, abd TTP. " The initial radiographic and laboratory data are worrisome because of Severe acute pancreatitis on CT, WBC 0.5, platelets 43, lipase 3461, AST and ALT in the 600s, AKI BUN 61. " The chronic co-morbidities include Metastatic BRCA, adult failure to thrive.   * I certify that at the point of admission it is my clinical judgment that the patient will require inpatient hospital care spanning beyond 2 midnights from the point of admission due to high intensity of service, high risk for further deterioration and high frequency of surveillance required.*    GARDNER, JARED M. DO [facility]  How to contact the [facility] Attending or Consulting provider [place] or covering provider during after hours [place], for this patient?  1. Check the care team in [facility] and look for a) attending/consulting [facility] provider listed and b) the [facility] team listed 2. Log into [email or web address]  Amion Physician Scheduling and messaging for groups and whole hospitals  On call and physician scheduling software for group practices, residents, hospitalists and other medical providers for call, clinic, rotation and shift schedules. OnCall [employer] is a hospital-wide system for scheduling doctors and paging doctors on call. EasyPlot is for scientific plotting  and data analysis.  [email or web address]  and use [place]'s universal password to access. If you do not have the password, please contact the hospital operator.  3. Locate the [facility] provider you are looking for under [facility] and page to a number that you can be directly reached. 4. If you still have difficulty reaching the provider, please page the [facility] (Director on Call) for the Hospitalists listed on amion for assistance.  05/06/2018, 9:01 PM

## 2018-05-26 NOTE — ED Triage Notes (Signed)
Pt BIB EMS from home. Pt reports generalized abdominal pain that started this morning. Pt denies N/V/D and fever. Denies urinal symptoms. Pt has hx of cancer. Pt reports generalized weakness possibly from recent cancer treatments.   18G LAC 126/84 HR 84 RR 24 98% RA

## 2018-05-27 DIAGNOSIS — Z515 Encounter for palliative care: Secondary | ICD-10-CM

## 2018-05-27 DIAGNOSIS — Z7189 Other specified counseling: Secondary | ICD-10-CM

## 2018-05-27 LAB — COMPREHENSIVE METABOLIC PANEL
ALT: 527 U/L — ABNORMAL HIGH (ref 0–44)
ANION GAP: 5 (ref 5–15)
AST: 482 U/L — ABNORMAL HIGH (ref 15–41)
Albumin: 2.3 g/dL — ABNORMAL LOW (ref 3.5–5.0)
Alkaline Phosphatase: 204 U/L — ABNORMAL HIGH (ref 38–126)
BUN: 62 mg/dL — AB (ref 8–23)
CO2: 22 mmol/L (ref 22–32)
Calcium: 8.1 mg/dL — ABNORMAL LOW (ref 8.9–10.3)
Chloride: 110 mmol/L (ref 98–111)
Creatinine, Ser: 1.33 mg/dL — ABNORMAL HIGH (ref 0.44–1.00)
GFR calc Af Amer: 44 mL/min — ABNORMAL LOW (ref >60)
GFR calc non Af Amer: 38 mL/min — ABNORMAL LOW (ref >60)
Glucose, Bld: 85 mg/dL (ref 70–99)
Potassium: 4.2 mmol/L (ref 3.5–5.1)
Sodium: 137 mmol/L (ref 135–145)
Total Bilirubin: 1.2 mg/dL (ref 0.3–1.2)
Total Protein: 4.4 g/dL — ABNORMAL LOW (ref 6.5–8.1)

## 2018-05-27 LAB — CBC
HCT: 31.1 % — ABNORMAL LOW (ref 36.0–46.0)
Hemoglobin: 9.6 g/dL — ABNORMAL LOW (ref 12.0–15.0)
MCH: 30.3 pg (ref 26.0–34.0)
MCHC: 30.9 g/dL (ref 30.0–36.0)
MCV: 98.1 fL (ref 80.0–100.0)
Platelets: 28 10*3/uL — CL (ref 150–400)
RBC: 3.17 MIL/uL — ABNORMAL LOW (ref 3.87–5.11)
RDW: 15 % (ref 11.5–15.5)
WBC: 0.6 10*3/uL — CL (ref 4.0–10.5)
nRBC: 0 % (ref 0.0–0.2)

## 2018-05-27 MED ORDER — LORAZEPAM 2 MG/ML IJ SOLN
0.5000 mg | Freq: Four times a day (QID) | INTRAMUSCULAR | Status: DC
  Administered 2018-05-27 – 2018-05-31 (×10): 0.5 mg via INTRAVENOUS
  Filled 2018-05-27 (×10): qty 1

## 2018-05-27 MED ORDER — SODIUM CHLORIDE 0.9 % IV SOLN
INTRAVENOUS | Status: DC
  Administered 2018-05-27 – 2018-05-28 (×3): via INTRAVENOUS
  Administered 2018-05-28: 1000 mL via INTRAVENOUS
  Administered 2018-05-29: 03:00:00 via INTRAVENOUS

## 2018-05-27 MED ORDER — LORAZEPAM 2 MG/ML IJ SOLN: 0.5000 mg | INTRAMUSCULAR | Status: DC | PRN

## 2018-05-27 MED ORDER — BIOTENE DRY MOUTH MT LIQD: 15.0000 mL | OROMUCOSAL | Status: DC | PRN

## 2018-05-27 MED ORDER — POLYVINYL ALCOHOL 1.4 % OP SOLN: 1.0000 [drp] | Freq: Four times a day (QID) | OPHTHALMIC | Status: DC | PRN

## 2018-05-27 MED ORDER — HALOPERIDOL LACTATE 2 MG/ML PO CONC
0.5000 mg | ORAL | Status: DC | PRN
  Filled 2018-05-27: qty 0.3

## 2018-05-27 MED ORDER — GLYCOPYRROLATE 1 MG PO TABS: 1.0000 mg | ORAL_TABLET | ORAL | Status: DC | PRN

## 2018-05-27 MED ORDER — LORAZEPAM 2 MG/ML IJ SOLN: 0.5000 mg | INTRAMUSCULAR | Status: DC

## 2018-05-27 MED ORDER — GLYCOPYRROLATE 0.2 MG/ML IJ SOLN: 0.2000 mg | INTRAMUSCULAR | Status: DC | PRN

## 2018-05-27 MED ORDER — HALOPERIDOL LACTATE 5 MG/ML IJ SOLN: 0.5000 mg | INTRAMUSCULAR | Status: DC | PRN

## 2018-05-27 MED ORDER — HALOPERIDOL 0.5 MG PO TABS: 0.5000 mg | ORAL_TABLET | ORAL | Status: DC | PRN

## 2018-05-27 MED ORDER — HYDROMORPHONE HCL 1 MG/ML IJ SOLN
0.5000 mg | INTRAMUSCULAR | Status: DC
  Administered 2018-05-27 – 2018-05-30 (×17): 0.5 mg via INTRAVENOUS
  Filled 2018-05-27 (×18): qty 1

## 2018-05-27 MED ORDER — HYDROMORPHONE HCL 1 MG/ML IJ SOLN
0.5000 mg | INTRAMUSCULAR | Status: DC | PRN
  Administered 2018-05-28: 0.5 mg via INTRAVENOUS
  Filled 2018-05-27: qty 1

## 2018-05-27 NOTE — Progress Notes (Signed)
CRITICAL VALUE ALERT  Critical Value:  Rectal temp 92.1 F  Date & Time Notied:  05/27/18 @ 1657XU  Provider Notified: On-call [facility]  Orders Received/Actions taken: No orders yet

## 2018-05-27 NOTE — Progress Notes (Addendum)
Report given to [place]. Will transfer shortly  Family made aware

## 2018-05-27 NOTE — Progress Notes (Signed)
Bladder scan 209 ml in bladder while physician at bedside. [place] MD placed orders for a foley catheter. Inserted at bedside by this RN. Urine returned.

## 2018-05-27 NOTE — Progress Notes (Signed)
CRITICAL VALUE ALERT  Critical Value:  WBC 0.6 K/uL & Platelets 28 K/uL  Date & Time Notied:  05/27/18 @ 0422am  Provider Notified: On-call [facility]  Orders Received/Actions taken: No new orders yet

## 2018-05-27 NOTE — Progress Notes (Signed)
Palliative:  Full note to follow. I have spoken with daughter, Morgan Morrow. Ms. Caldas is sedated on pain medication and reportedly confused and agitated with pain when she is awake. Dianna is very clear about GOC and at this time she and her siblings have discussed and would like to focus on full comfort care. Will schedule pain and anxiety medication with liberal prn doses available to ensure comfort. Will also transfer out of ICU to regular room for comfort. Dianna has also inquired about hospice placement. Unclear if she will be stable to transfer to hospice - will reassess tomorrow.   Vinie Sill, NP Palliative Medicine Team Pager # [phone] (M-F 8a-5p) Team Phone # [phone] (Nights/Weekends)

## 2018-05-27 NOTE — Consult Note (Signed)
Consultation Note Date: 05/27/2018   Patient Name: Morgan Morrow  DOB: [birth year]  MRN: [id number]  Age / Sex: 78 y.o., female  PCP: Leighton Ruff, MD Referring Physician: [place] Eric J, DO  Reason for Consultation: Establishing goals of care  HPI/Patient Profile: 78 y.o. female  with past medical history of R breast cancer (mets to lung, bone, liver, brain diagnosed January 2020; follows with Dr. Jana Hakim), HTN admitted on 05/06/2018 with abd pain. Found to have acute pancreatitis, febrile neutropenia, thrombocytopenia with chronic failure to thrive.   Clinical Assessment and Goals of Care: I met today with daughter, Morgan Morrow, as Morgan Morrow slept. Morgan Morrow shares with me that her mother has always avoided doctors and healthcare. Morgan Morrow says that she has known for a long time that her mother was ill but could not convince her to seek treatment or work up. Morgan Morrow shares that she was not surprised at all of her mother's breast cancer diagnosis. However, given her mother's history of avoidance they were all surprised that Morgan Morrow pursued treatment. She describes her mother as very strong and independent.   We discussed aggressive vs comfort pathways. Morgan Morrow shares that all the family are preparing that Morgan Morrow is at the end of her life. She feels her mother is suffering and wishes to focus on comfort. We discussed stopping antibiotics and all medications that are not adding to comfort. We will add scheduled and liberal prn medications for pain and anxiety. Will get Morgan Morrow out of ICU and to regular room. We also discussed consideration of hospice facility if this seems appropriate. Emotional support provided.   Primary Decision Maker 4 adult children; Morgan Morrow is local daughter who speaks for the family When patient is awake she is agitated and confused in pain    SUMMARY OF RECOMMENDATIONS   - DNR - Full  comfort care  Code Status/Advance Care Planning:  DNR   Symptom Management:   Pain/SOB: Dilaudid 0.5 mg IV every 4 hours scheduled and 0.5 mg IV every 1 hour prn.   Anxiety: Lorazepam 0.5 mg IV QID and 0.5 mg IV every 2 hours prn.   Palliative Prophylaxis:   Aspiration, Delirium Protocol, Frequent Pain Assessment, Oral Care and Turn Reposition  Additional Recommendations (Limitations, Scope, Preferences):  Full Comfort Care  Psycho-social/Spiritual:   Desire for further Chaplaincy support:no - Morgan Morrow says that they may want priest for [place] but this would be more for family than her mother (educated that we can contact our spiritual care department to help arrange if desired). They are not connected with a parish.   Additional Recommendations: Caregiving  Support/Resources, Education on Hospice and Grief/Bereavement Support  Prognosis:   Hours - Days  Discharge Planning: Hospital death vs hospice facility      Primary Diagnoses: Present on Admission: . Febrile neutropenia ([place]) . Metastatic breast cancer ([place]) . Essential hypertension . Malnutrition of moderate degree . Cachexia ([place]) . Severe acute pancreatitis . Transaminitis   I have reviewed the medical record, interviewed the  patient and family, and examined the patient. The following aspects are pertinent.  Past Medical History:  Diagnosis Date  . Allergy   . Hypertension    Social History   Socioeconomic History  . Marital status: Widowed    Spouse name: Not on file  . Number of children: Not on file  . Years of education: Not on file  . Highest education level: Not on file  Occupational History  . Occupation: retired  Scientific laboratory technician  . Financial resource strain: Not on file  . Food insecurity:    Worry: Not on file    Inability: Not on file  . Transportation needs:    Medical: Not on file    Non-medical: Not on file  Tobacco Use  . Smoking status: Never Smoker  . Smokeless  tobacco: Never Used  Substance and Sexual Activity  . Alcohol use: No  . Drug use: No  . Sexual activity: Not Currently    Partners: Male  Lifestyle  . Physical activity:    Days per week: Not on file    Minutes per session: Not on file  . Stress: Not on file  Relationships  . Social connections:    Talks on phone: Not on file    Gets together: Not on file    Attends religious service: Not on file    Active member of club or organization: Not on file    Attends meetings of clubs or organizations: Not on file    Relationship status: Not on file  Other Topics Concern  . Not on file  Social History Narrative   DPR FORM SIGNED APPOINTING DAUGHTER, [place]. OK TO LEAVE MESSAGE ON HOME NUMBER [phone]      Walk daily   Family History  Problem Relation Age of Onset  . Hypertension Mother   . Depression Mother   . Hypertension Father   . Arthritis Father    Scheduled Meds: . dexamethasone  2 mg Oral Daily  . hydrocortisone sod succinate (SOLU-CORTEF) inj  50 mg Intravenous Q8H  . pantoprazole  40 mg Oral Daily  . Tbo-filgastrim (GRANIX) SQ  300 mcg Subcutaneous q1800   Continuous Infusions: . sodium chloride 100 mL/hr at 05/27/18 1500  . ceFEPime (MAXIPIME) IV Stopped (05/27/18 4540)  . metronidazole Stopped (05/27/18 1024)   PRN Meds:.acetaminophen **OR** acetaminophen, fentaNYL (SUBLIMAZE) injection, ondansetron **OR** ondansetron (ZOFRAN) IV No Known Allergies Review of Systems  Unable to perform ROS: Acuity of condition    Physical Exam Vitals signs and nursing note reviewed.  Constitutional:      General: She is sleeping.     Appearance: She is cachectic. She is ill-appearing.  Cardiovascular:     Rate and Rhythm: Normal rate and regular rhythm.  Pulmonary:     Effort: Pulmonary effort is normal. No tachypnea, accessory muscle usage or respiratory distress.  Neurological:     Comments: Sleeping, I did not awaken     Vital Signs: BP 115/69   Pulse 77    Temp 98.5 F (36.9 C) (Axillary)   Resp 16   Ht 5' (1.524 m)   Wt 36.3 kg   SpO2 98%   BMI 15.62 kg/m  Pain Scale: 0-10   Pain Score: Asleep   SpO2: SpO2: 98 % O2 Device:SpO2: 98 % O2 Flow Rate: .   IO: Intake/output summary:   Intake/Output Summary (Last 24 hours) at 05/27/2018 1508 Last data filed at 05/27/2018 1500 Gross per 24 hour  Intake 1848.04 ml  Output -  Net 1848.04 ml    LBM: Last BM Date: 05/25/18 Baseline Weight: Weight: 36.3 kg Most recent weight: Weight: 36.3 kg     Palliative Assessment/Data: 20%     Time In: 1500 Time Out: 1610 Time Total: 70 min Greater than 50%  of this time was spent counseling and coordinating care related to the above assessment and plan.  Signed by: Vinie Sill, NP Palliative Medicine Team Pager # [phone] (M-F 8a-5p) Team Phone # [phone] (Nights/Weekends)

## 2018-05-27 NOTE — Progress Notes (Signed)
PROGRESS NOTE    Morgan Morrow  BJY:[id number] DOB: 02-12-1941 DOA: 05/08/2018 PCP: Leighton Ruff, MD   Brief Narrative:  Morgan Morrow is a 78 y.o. 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  Patient was started on Ibrance 20 days ago as well as decadron and anastrozole.  Today she had abrupt onset this morning of severe abd pain and fullness.  No vomiting nor diarrhea.  No urinary symptoms.  Associated generalized weakness and nausea.  ED Course: T 93.5, Severe acute pancreatitis on CT, WBC 0.5, platelets 43, lipase 3461, AST and ALT in the 600s, AKI BUN 61. Put on cefepime and flagyl.  Hospitalist asked to admit.   Assessment & Plan:   Principal Problem:   Febrile neutropenia (HCC) Active Problems:   Essential hypertension   Cachexia (HCC)   Metastatic breast cancer (HCC)   Malnutrition of moderate degree   Severe acute pancreatitis   Transaminitis   Hypothermia Neutropenia Patient presenting from home with progressive decline with acute onset abdominal discomfort.  Recently diagnosed with metastatic breast cancer and started on chemotherapy with Ibrance, anastrozole and Decadron.  On admission patient was noted to have a temperature of 92.1, WBC count of 0.6 with ANC of 0.4.  Lactic acid 1.1.  TSH was 2.311 and random cortisol 6.1.  Urinalysis unrevealing. Patient was started on broad-spectrum antibiotics and stress dose steroids. --Started on Granix by admitting physician after discussion with on-call oncology Dr. Keene Breath --Continue antibiotics with cefepime and Flagyl --Continue stress dose steroids with hydrocortisone 50 mg IV every 8 hours --Bair hugger --Supportive care; grim prognosis  Severe acute pancreatitis Presenting with severe abdominal discomfort, acutely.  Was noted to have an elevated lipase of 3461, with an elevated AST of 684 and ALT of 665.  CT abdomen/pelvis  notable for severe pancreatitis with gaseous to dilation of large bowel consistent with possible ileus; no contrast was utilized during the CT so unable to determine if necrotizing infection.  Gallbladder visualized without wall thickening, no biliary duct dilation, no distention, but noted cholelithiasis.  Question etiology secondary to chemotherapy agents. --BISAP score 2 (age and AMS) --check LDH to calculate ranson score --LFTs downtrending today --Continue antibiotics as above --IV fluids with normal saline at 100 mL's per hour --Pain control with fentanyl prn --Daily CMP with lipase  Thrombocytopenia DIC panel ordered, with elevated d-dimer of 6.32 and low fibrinogen of 1.82.  No schistocytes seen on peripheral smear.  Platelets 28.  No signs of active bleeding.  Hemoglobin 9.6.  INR 1.03 --Even though no schistocytes noted, could still be DIC versus chemotherapy-induced --Continue to monitor platelets daily, transfuse for platelet count less than 10 or active bleeding --Continue antibiotics and supportive care as above.  Acute urinary retention No urinary output since admission, bladder scan with 203 mL's --Foley catheter placed on 05/27/2018, continue to monitor strict I's and O's  Right Breast cancer with metastasis Patient follows in outpatient oncology office with Dr. Jana Hakim.  Per family, she has been declining for several years but refused further medical work-up until recently.  CT chest/abdomen/pelvis Jan 2020 notable for extensive osteolytic abnormalities, liver lesions, and multiple pulmonary nodules bilaterally.  Underwent biopsy of right breast on 04/16/2018 that was remarkable for estrogen and progesterone receptor strongly positive, H ER-2 nonamplified.  MR brain with/without contrast 04/26/2018 notable for multiple calvarial metastasis, dural thickening compatible with dural invasion.  Started on anastrozole, Ibrance, and  Decadron.  Was due to start denosumab/xgeva on  05/28/2018. --Holding anastrozole and Ibrance --On stress dose steroids as above --Discussed extensively with daughter [name] and granddaughter [name] at bedside, patient has extremely grim diagnosis and may not survive this hospitalization, they seem to be understanding as they thought initially she should transition to hospice previously given her significant disease burden.  Adult failure to thrive Severe protein calorie malnutrition Urology likely from progressive stage IV breast cancer with metastasis to bone, lung, liver, and scalp.  Patient's BMI is 15.62.  Apparently has hid her skin findings surrounding her right breast per oncology's notes from her family and physicians for many years.  Continue to progressively decline which prompted her family to assist her seeking care in the ED initially back in January when this was all diagnosed. --Continue IV fluid hydration for acute pancreatitis as above --Supportive care --Palliative care consulted for assistance with goals ofcare and medical decision making, likely hospice candidate.   DVT prophylaxis: SCDs given severe thrombocytopenia with a platelet count of 28 and bleeding risk Code Status: DNR Family Communication: Discussed with [name], daughter; and granddaughter [name] at bedside Disposition Plan: Inpatient level care, likely hospice on discharge if survives hospitalization   Consultants:   Palliative care  Gastroenterology  Procedures:   Foley catheter 05/27/2018  Antimicrobials:   Cefepime 2/22  Flagyl 2/22   Subjective: Patient seen and examined at bedside, nursing staff present.  Very thin and cachectic in appearance.  Will not answer verbally to any commands but will nod and shake head.  Daughter, [name] and granddaughter [name] at bedside, discussed with him extensively how grim her prognosis given her severely low white and platelet counts, hypothermia, and significant disease burden from her metastatic breast  cancer.  Patient continues on IV antibiotics, stress dose steroids, and IV fluids for acute pancreatitis.  Family seems to be understanding regarding that she may not survive this hospitalization; and they thought that even prior should be more of a candidate for hospice.  Patient with noted no urine output since admission and bladder scan with 203 mL's of retained urine, Foley catheter placed.  No other concerns per nursing staff overnight.  Unable to obtain any further information from patient as she is nonverbal currently.  Objective: Vitals:   05/27/18 0600 05/27/18 0700 05/27/18 0800 05/27/18 0900  BP: 101/61 (!) 105/58 116/77 (!) 117/55  Pulse: 63 67 82 88  Resp: 10 11 (!) 22 (!) 28  Temp:   98.1 F (36.7 C)   TempSrc:   Oral   SpO2: 99% 98% 96% 96%  Weight:      Height:        Intake/Output Summary (Last 24 hours) at 05/27/2018 0355 Last data filed at 05/27/2018 0830 Gross per 24 hour  Intake 1199.5 ml  Output -  Net 1199.5 ml   Filed Weights   05/23/2018 1612  Weight: 36.3 kg    Examination:  GEN: 78 yo CF in NAD, thin/cachectic in appearance, nonverbal, will move head to command HEENT: NCAT, PERRL, EOMI, sclera clear, mucous membranes PULM: CTAB w/o wheezes/crackles, normal respiratory effort, on room air CV: RRR w/o M/G/R GI: abd soft, mild epigastric tenderness, nondistended, NABS, no R/G/M MSK: no peripheral edema, moves all extremities independently NEURO: no focal deficits, nonverbal, will move head to command, unsure if appropriate or not PSYCH: Unable to fully assess given patient's mental status Integumentary: Stage II sacral ulcer    Data Reviewed: I have personally reviewed following labs and  imaging studies  CBC: Recent Labs  Lab 05/22/2018 1609 05/27/2018 2154 05/27/18 0349  WBC 0.5*  --  0.6*  NEUTROABS 0.4*  --   --   HGB 11.7*  --  9.6*  HCT 36.3  --  31.1*  MCV 94.5  --  98.1  PLT 43* 33* 28*   Basic Metabolic Panel: Recent Labs  Lab  05/16/2018 1609 05/27/18 0349  NA 134* 137  K 4.3 4.2  CL 100 110  CO2 24 22  GLUCOSE 95 85  BUN 61* 62*  CREATININE 1.27* 1.33*  CALCIUM 8.9 8.1*   GFR: Estimated Creatinine Clearance: 19.7 mL/min (A) (by C-G formula based on SCr of 1.33 mg/dL (H)). Liver Function Tests: Recent Labs  Lab 05/28/2018 1609 05/27/18 0349  AST 684* 482*  ALT 665* 527*  ALKPHOS 255* 204*  BILITOT 1.3* 1.2  PROT 5.7* 4.4*  ALBUMIN 3.0* 2.3*   Recent Labs  Lab 05/05/2018 1609  LIPASE 3,461*   No results for input(s): AMMONIA in the last 168 hours. Coagulation Profile: Recent Labs  Lab 05/29/2018 2154  INR 1.03   Cardiac Enzymes: No results for input(s): CKTOTAL, CKMB, CKMBINDEX, TROPONINI in the last 168 hours. BNP (last 3 results) No results for input(s): PROBNP in the last 8760 hours. HbA1C: No results for input(s): HGBA1C in the last 72 hours. CBG: No results for input(s): GLUCAP in the last 168 hours. Lipid Profile: No results for input(s): CHOL, HDL, LDLCALC, TRIG, CHOLHDL, LDLDIRECT in the last 72 hours. Thyroid Function Tests: Recent Labs    05/27/2018 1616  TSH 2.311   Anemia Panel: No results for input(s): VITAMINB12, FOLATE, FERRITIN, TIBC, IRON, RETICCTPCT in the last 72 hours. Sepsis Labs: Recent Labs  Lab 05/06/2018 1609 05/25/2018 2154  LATICACIDVEN 1.7 1.1    Recent Results (from the past 240 hour(s))  Blood culture (routine x 2)     Status: None (Preliminary result)   Collection Time: 05/13/2018  4:16 PM  Result Value Ref Range Status   Specimen Description BLOOD RIGHT ANTECUBITAL  Final   Special Requests   Final    BOTTLES DRAWN AEROBIC ONLY Blood Culture adequate volume Performed at [facility] [address] [place], [place] [address]    Culture NO GROWTH < 12 HOURS  Final   Report Status PENDING  Incomplete  Blood culture (routine x 2)     Status: None (Preliminary result)   Collection Time: 05/20/2018  9:54 PM  Result Value Ref Range  Status   Specimen Description BLOOD RIGHT ANTECUBITAL  Final   Special Requests   Final    BOTTLES DRAWN AEROBIC ONLY Blood Culture adequate volume Performed at [facility] [address] [place], [place] [address]    Culture NO GROWTH < 12 HOURS  Final   Report Status PENDING  Incomplete  MRSA PCR Screening     Status: None   Collection Time: 05/22/2018 10:08 PM  Result Value Ref Range Status   MRSA by PCR NEGATIVE NEGATIVE Final    Comment:        The GeneXpert MRSA Assay (FDA approved for NASAL specimens only), is one component of a comprehensive MRSA colonization surveillance program. It is not intended to diagnose MRSA infection nor to guide or monitor treatment for MRSA infections. Performed at [facility], [place] [address], [place], [place] [address]          Radiology Studies: Ct Abdomen Pelvis Wo Contrast  Result Date:  05/08/2018 CLINICAL DATA:  Metastatic breast cancer. Generalized weakness and generalized abdominal pain. EXAM: CT ABDOMEN AND PELVIS WITHOUT CONTRAST TECHNIQUE: Multidetector CT imaging of the abdomen and pelvis was performed following the standard protocol without IV contrast. COMPARISON:  04/13/2018 CT abdomen/pelvis. FINDINGS: Lower chest: Previously visualized extensive patchy nodularity at both lung bases on 04/13/2018 CT abdomen study has decreased. For example, a 5 mm medial basilar right lower lobe nodule (series 4/image 23), decreased from 7 mm. Basilar left lower lobe 7 mm nodule (series 4/image 18), decreased from 10 mm. Peripheral right lower lobe 7 mm nodule (series 4/image 9), decreased from 10 mm. Coronary atherosclerosis. Hepatobiliary: Normal liver size. Previously visualized hypodense liver masses are less conspicuous on today's noncontrast CT, however appear stable to mildly decreased. For example, a superior right liver 0.9 cm lesion (series 2/image 9), slightly decreased from 1.1 cm. Anterior inferior  right liver 1.8 cm lesion (series 2/image 20), stable. Posterior inferior right liver 1.0 cm lesion (series 2/image 17), slightly decreased from 1.2 cm. No appreciable new liver lesions. Cholelithiasis. No gallbladder distention. No gallbladder wall thickening. No biliary ductal dilatation. Pancreas: New diffuse thickening of the pancreas with diffuse peripancreatic fat stranding and peripancreatic fluid, compatible with acute pancreatitis. Inflammatory fluid extends into the bilateral anterior paranephric retroperitoneal spaces. No pancreatic parenchymal gas. Spleen: Normal size. No mass. Adrenals/Urinary Tract: Normal adrenals. No hydronephrosis. No renal stones. Isodense subcentimeter renal cortical lesion in the lateral upper left kidney is too small to characterize and is unchanged. No new contour deforming renal lesions. Normal nondistended bladder. Stomach/Bowel: Normal non-distended stomach. Normal caliber small bowel with no small bowel wall thickening. Appendix not discretely visualized. Mild sigmoid diverticulosis. Moderate gaseous dilatation of the large bowel, most prominent in the cecum and rectum. No definite large bowel wall thickening. Vascular/Lymphatic: Atherosclerotic nonaneurysmal abdominal aorta. No pathologically enlarged lymph nodes in the abdomen or pelvis. Reproductive: Grossly normal uterus.  No adnexal mass. Other: No free air. Small volume ascites. No focal fluid collection. Musculoskeletal: Widespread patchy sclerotic osseous metastatic disease throughout the visualized axial and proximal appendicular skeleton, not appreciably changed. Chronic moderate L1 vertebral compression fracture. IMPRESSION: 1. Acute pancreatitis, which appears severe on this noncontrast scan, which precludes assessment for pancreatic necrosis. IV contrast was not administered due to acute renal failure. 2. Small volume ascites. 3. Moderate gaseous dilatation of the large bowel compatible with adynamic ileus. No  bowel obstruction. 4. Widespread pulmonary nodularity at the lung bases, improved from 04/13/2018 CT. 5. Liver masses are stable to mildly decreased. 6. Widespread patchy sclerotic osseous metastatic disease throughout the visualized skeleton, unchanged. 7. Cholelithiasis. 8.  Aortic Atherosclerosis (ICD10-I70.0). Electronically Signed   By: Ilona Sorrel M.D.   On: 05/10/2018 19:27   Dg Abdomen Acute W/chest  Result Date: 05/29/2018 CLINICAL DATA:  Abdominal pain EXAM: DG ABDOMEN ACUTE W/ 1V CHEST COMPARISON:  CT 04/14/2018 FINDINGS: Heart is normal size. No confluent airspace opacities or effusions. Old right lateral 8th rib fracture, stable. Mild gaseous distention of the colon may reflect mild ileus. No evidence for bowel obstruction. No organomegaly or free air. Scoliosis and degenerative changes in the lumbar spine. IMPRESSION: Gaseous distention of the colon diffusely suggests ileus. No evidence of bowel obstruction or free air. No acute cardiopulmonary disease. Electronically Signed   By: Rolm Baptise M.D.   On: 05/20/2018 18:59        Scheduled Meds: . dexamethasone  2 mg Oral Daily  . hydrocortisone sod succinate (SOLU-CORTEF) inj  50 mg  Intravenous Q8H  . pantoprazole  40 mg Oral Daily  . Tbo-filgastrim (GRANIX) SQ  300 mcg Subcutaneous q1800   Continuous Infusions: . sodium chloride 100 mL/hr at 05/27/18 0830  . ceFEPime (MAXIPIME) IV Stopped (05/27/18 6761)  . metronidazole 500 mg (05/27/18 0923)     LOS: 1 day    Time spent: 67 minutes    Mateus Rewerts J [place] DO [facility] Pager [phone]  If 7PM-7AM, please contact night-coverage [email or web address] Password [facility] 05/27/2018, 9:34 AM

## 2018-05-27 NOTE — Consult Note (Signed)
Referring Provider: Dr. Ellender Hose Primary Care Physician:  Leighton Ruff, MD Primary Gastroenterologist:  Althia Forts  Reason for Consultation:  Pancreatitis  HPI: Morgan Morrow is a 78 y.o. female with metastatic breast cancer to the lung, brain, bone, and liver on chemotherapy being seen for a consult due to abdominal pain and was found to have pancreatitis on a non-contrast CT and Lipase 3,461. TB 1.3, ALP 255, AST 684, ALT 665. WBC 0.5, Hgb 11.7, Plts 43. Patient has been receiving pain meds and is not able to answer any questions. Daughter and granddaughter at bedside.  Past Medical History:  Diagnosis Date  . Allergy   . Hypertension     Past Surgical History:  Procedure Laterality Date  . BREAST BIOPSY Right 04/16/2018   Procedure: RIGHT BREAST BIOPSY AND RIGHT POSTERIOR  SCALP BIOPSY AND TOP SCALP BIOPSY;  Surgeon: Johnathan Hausen, MD;  Location: [facility];  Service: General;  Laterality: Right;  . NO PAST SURGERIES      Prior to Admission medications   Medication Sig Start Date End Date Taking? Authorizing Provider  acetaminophen (TYLENOL) 500 MG tablet Take 500 mg by mouth every 6 (six) hours as needed for mild pain.   Yes [provider]  amLODipine (NORVASC) 5 MG tablet Take 5 mg by mouth daily.   Yes [provider]  anastrozole (ARIMIDEX) 1 MG tablet Take 1 mg by mouth daily.   Yes [provider]  dexamethasone (DECADRON) 2 MG tablet Take 2 mg by mouth daily.   Yes [provider]  feeding supplement, ENSURE ENLIVE, (ENSURE ENLIVE) LIQD Take 237 mLs by mouth 2 (two) times daily between meals. 04/20/18  Yes [name], MD  lisinopril (PRINIVIL,ZESTRIL) 20 MG tablet Take 20 mg by mouth daily. 04/23/18  Yes [provider]  palbociclib (IBRANCE) 125 MG capsule Take 1 capsule (125 mg total) by mouth daily with breakfast. Take whole with food. Take for 21 days on, 7 days off, repeat every 28 days. Patient taking differently:  Take 125 mg by mouth daily after breakfast.  05/04/18  Yes [name], [name], MD  pantoprazole (PROTONIX) 40 MG tablet Take 1 tablet (40 mg total) by mouth daily. 04/20/18 04/20/19 Yes [name], MD  simethicone (MYLICON) 80 MG chewable tablet Chew 2 tablets (160 mg total) by mouth 4 (four) times daily as needed for flatulence. Patient taking differently: Chew 160 mg by mouth every 6 (six) hours as needed for flatulence.  04/30/18  Yes [name], MD    Scheduled Meds: . dexamethasone  2 mg Oral Daily  . hydrocortisone sod succinate (SOLU-CORTEF) inj  50 mg Intravenous Q8H  . pantoprazole  40 mg Oral Daily  . Tbo-filgastrim (GRANIX) SQ  300 mcg Subcutaneous q1800   Continuous Infusions: . sodium chloride 100 mL/hr at 05/27/18 1032  . ceFEPime (MAXIPIME) IV Stopped (05/27/18 9476)  . metronidazole Stopped (05/27/18 1024)   PRN Meds:.acetaminophen **OR** acetaminophen, fentaNYL (SUBLIMAZE) injection, ondansetron **OR** ondansetron (ZOFRAN) IV  Allergies as of 05/28/2018  . (No Known Allergies)    Family History  Problem Relation Age of Onset  . Hypertension Mother   . Depression Mother   . Hypertension Father   . Arthritis Father     Social History   Socioeconomic History  . Marital status: Widowed    Spouse name: Not on file  . Number of children: Not on file  . Years of education: Not on file  . Highest education level:  Not on file  Occupational History  . Occupation: retired  Scientific laboratory technician  . Financial resource strain: Not on file  . Food insecurity:    Worry: Not on file    Inability: Not on file  . Transportation needs:    Medical: Not on file    Non-medical: Not on file  Tobacco Use  . Smoking status: Never Smoker  . Smokeless tobacco: Never Used  Substance and Sexual Activity  . Alcohol use: No  . Drug use: No  . Sexual activity: Not Currently    Partners: Male  Lifestyle  . Physical activity:    Days per week: Not on file    Minutes per  session: Not on file  . Stress: Not on file  Relationships  . Social connections:    Talks on phone: Not on file    Gets together: Not on file    Attends religious service: Not on file    Active member of club or organization: Not on file    Attends meetings of clubs or organizations: Not on file    Relationship status: Not on file  . Intimate partner violence:    Fear of current or ex partner: Not on file    Emotionally abused: Not on file    Physically abused: Not on file    Forced sexual activity: Not on file  Other Topics Concern  . Not on file  Social History Narrative   [facility] SIGNED APPOINTING DAUGHTER, [place]. OK TO LEAVE MESSAGE ON HOME NUMBER [phone]      Walk daily    Review of Systems: All negative except as stated above in HPI.  Physical Exam: Vital signs: Vitals:   05/27/18 1000 05/27/18 1100  BP: 116/61 117/63  Pulse: 92 88  Resp: (!) 32 (!) 29  Temp:    SpO2: 96% 97%  T 92.1  Last BM Date: 05/25/18 General:   Thin, elderly, somnolent, no acute distress Head: normocephalic, atraumatic ENT: lips pursed Neck: supple, nontender Lungs:  Clear throughout to auscultation.   No wheezes, crackles, or rhonchi. No acute distress. Heart:  Regular rate and rhythm; no murmurs, clicks, rubs,  or gallops. Abdomen: diffuse tenderness with guarding, mild distention, +BS  Rectal:  Deferred Ext: no edema  GI:  Lab Results: Recent Labs    05/11/2018 1609 05/12/2018 2154 05/27/18 0349  WBC 0.5*  --  0.6*  HGB 11.7*  --  9.6*  HCT 36.3  --  31.1*  PLT 43* 33* 28*   BMET Recent Labs    05/17/2018 1609 05/27/18 0349  NA 134* 137  K 4.3 4.2  CL 100 110  CO2 24 22  GLUCOSE 95 85  BUN 61* 62*  CREATININE 1.27* 1.33*  CALCIUM 8.9 8.1*   LFT Recent Labs    05/27/18 0349  PROT 4.4*  ALBUMIN 2.3*  AST 482*  ALT 527*  ALKPHOS 204*  BILITOT 1.2   PT/INR Recent Labs    05/10/2018 2154  LABPROT 13.4  INR 1.03     Studies/Results: Ct Abdomen  Pelvis Wo Contrast  Result Date: 05/24/2018 CLINICAL DATA:  Metastatic breast cancer. Generalized weakness and generalized abdominal pain. EXAM: CT ABDOMEN AND PELVIS WITHOUT CONTRAST TECHNIQUE: Multidetector CT imaging of the abdomen and pelvis was performed following the standard protocol without IV contrast. COMPARISON:  04/13/2018 CT abdomen/pelvis. FINDINGS: Lower chest: Previously visualized extensive patchy nodularity at both lung bases on 04/13/2018 CT abdomen study has decreased. For example, a 5  mm medial basilar right lower lobe nodule (series 4/image 23), decreased from 7 mm. Basilar left lower lobe 7 mm nodule (series 4/image 18), decreased from 10 mm. Peripheral right lower lobe 7 mm nodule (series 4/image 9), decreased from 10 mm. Coronary atherosclerosis. Hepatobiliary: Normal liver size. Previously visualized hypodense liver masses are less conspicuous on today's noncontrast CT, however appear stable to mildly decreased. For example, a superior right liver 0.9 cm lesion (series 2/image 9), slightly decreased from 1.1 cm. Anterior inferior right liver 1.8 cm lesion (series 2/image 20), stable. Posterior inferior right liver 1.0 cm lesion (series 2/image 17), slightly decreased from 1.2 cm. No appreciable new liver lesions. Cholelithiasis. No gallbladder distention. No gallbladder wall thickening. No biliary ductal dilatation. Pancreas: New diffuse thickening of the pancreas with diffuse peripancreatic fat stranding and peripancreatic fluid, compatible with acute pancreatitis. Inflammatory fluid extends into the bilateral anterior paranephric retroperitoneal spaces. No pancreatic parenchymal gas. Spleen: Normal size. No mass. Adrenals/Urinary Tract: Normal adrenals. No hydronephrosis. No renal stones. Isodense subcentimeter renal cortical lesion in the lateral upper left kidney is too small to characterize and is unchanged. No new contour deforming renal lesions. Normal nondistended bladder.  Stomach/Bowel: Normal non-distended stomach. Normal caliber small bowel with no small bowel wall thickening. Appendix not discretely visualized. Mild sigmoid diverticulosis. Moderate gaseous dilatation of the large bowel, most prominent in the cecum and rectum. No definite large bowel wall thickening. Vascular/Lymphatic: Atherosclerotic nonaneurysmal abdominal aorta. No pathologically enlarged lymph nodes in the abdomen or pelvis. Reproductive: Grossly normal uterus.  No adnexal mass. Other: No free air. Small volume ascites. No focal fluid collection. Musculoskeletal: Widespread patchy sclerotic osseous metastatic disease throughout the visualized axial and proximal appendicular skeleton, not appreciably changed. Chronic moderate L1 vertebral compression fracture. IMPRESSION: 1. Acute pancreatitis, which appears severe on this noncontrast scan, which precludes assessment for pancreatic necrosis. IV contrast was not administered due to acute renal failure. 2. Small volume ascites. 3. Moderate gaseous dilatation of the large bowel compatible with adynamic ileus. No bowel obstruction. 4. Widespread pulmonary nodularity at the lung bases, improved from 04/13/2018 CT. 5. Liver masses are stable to mildly decreased. 6. Widespread patchy sclerotic osseous metastatic disease throughout the visualized skeleton, unchanged. 7. Cholelithiasis. 8.  Aortic Atherosclerosis (ICD10-I70.0). Electronically Signed   By: Ilona Sorrel M.D.   On: 05/08/2018 19:27   Dg Abdomen Acute W/chest  Result Date: 05/09/2018 CLINICAL DATA:  Abdominal pain EXAM: DG ABDOMEN ACUTE W/ 1V CHEST COMPARISON:  CT 04/14/2018 FINDINGS: Heart is normal size. No confluent airspace opacities or effusions. Old right lateral 8th rib fracture, stable. Mild gaseous distention of the colon may reflect mild ileus. No evidence for bowel obstruction. No organomegaly or free air. Scoliosis and degenerative changes in the lumbar spine. IMPRESSION: Gaseous distention  of the colon diffusely suggests ileus. No evidence of bowel obstruction or free air. No acute cardiopulmonary disease. Electronically Signed   By: Rolm Baptise M.D.   On: 05/11/2018 18:59    Impression/Plan: 78 yo with metastatic breast cancer admitted for pancreatitis likely due to passage of a gallstone and/or from chemotherapy or malignancy. She is also neutropenic and on broad spectrum antibiotics. LFTs are improving. No bile duct dilation. I doubt she has a choledocholithiasis and due to her neutropenic state on chemotherapy for metastatic breast cancer and improvement in LFTs without ductal dilation would NOT recommend an ERCP. Continue supportive care. Would favor palliative measures and daughter is in agreement. Will sign off. Call us back if questions.  LOS: 1 day   Lear Ng  05/27/2018, 11:08 AM  Questions please call [phone]

## 2018-05-28 ENCOUNTER — Inpatient Hospital Stay: Payer: Medicare Other

## 2018-05-28 ENCOUNTER — Inpatient Hospital Stay: Payer: Medicare Other | Admitting: Adult Health

## 2018-05-28 ENCOUNTER — Telehealth: Payer: Self-pay | Admitting: Internal Medicine

## 2018-05-28 NOTE — Progress Notes (Signed)
Palliative:  I met today at Ms. Mallon's bedside. She is resting comfortably although she did have a slight grimace briefly with no stimulation. RN reports no needs or concerns. No family at bedside.   I called and spoke with [name]. [name] has been in and out as she lives close by. She has spoken with her siblings and they have no concerns regarding comfort care. We discussed prognosis as hours to days. Given Ms. Ellinger's level of pain and discomfort I would avoid hospice facility but would reassess tomorrow if there is no change.   [name] says that her sister have requested for her mother to have [facility] and [name] would like to be a part of this. They are not associated with a parish. I have requested to spiritual care department to arrange for priest visit tomorrow morning if possible.   All questions/concerns addressed. Emotional support provided.   Exam: Sleeping, mostly comfortable. No distress. Thin, frail, cachectic. Pallor.   Plan: - Continue scheduled comfort meds as ordered. PLEASE DO NOT HOLD SCHEDULED MEDICATIONS.  - PRN medications available liberally to ensure comfort.  - Will reassess risk vs benefit of hospice facility tomorrow.   25 min  Vinie Sill, NP Palliative Medicine Team Pager # [phone] (M-F 8a-5p) Team Phone # [phone] (Nights/Weekends)

## 2018-05-28 NOTE — Progress Notes (Signed)
    [facility] Consult Note Telephone: [phone]  Fax: [phone]  PATIENT NAME: Morgan Morrow DOB: Jun 06, 1940 MRN: [id number]  PRIMARY CARE PROVIDER:   Leighton Ruff, MD  REFERRING PROVIDER: De Blanch, NP / Dr Ottis Stain    [facility]  RESPONSIBLE PARTY:    [name] (daughter)  [phone]    RECOMMENDATIONS and PLAN:   Palliative Care Encounter Z51.5  1. Anorexia with weight loss:  Related to advanced metastatic breast cancer and failure to thrive.  Unchanged.  No updated weight available beyond 77#.  No expectation for improvement.   2.  Advanced care planning: Readdressed goals of care.  Pt is to be discharged to home on 05/19/18.  Informed patient that she could have Hospice services at home.  She declined at this time and would like to continue with Palliative services. She denied knowing that the cancer was metastatic as I explained to her.   She plans on keeping appointment for PET scan, continue to take Anastrazole and F/U with Dr. [name](oncology).  Previous phone conversation with oncologist who states pt has terminal condition without use of Anastrozole. He prefers Palliative care at this time. MOST form reviewed and pt made selections of full scope of treatment but would still like to have code status as DNAR.   I do not feel that patient is realistic with her long term health descisions.  Chaplain services offered but she declined. Plan on Palliative visit at home after PET scan on 05/28/18.  Patient and daughter [name] agree with plan.  Daughter agrees that pt would benefit more from Hospice care.  I spent 60 minutes providing this consultation at bedside and at nurses station with phone conversation with daughter,  from 1500 to 16000. More than 50% of the time in this consultation was spent coordinating communication with patient, clinical staff and daughter [name] via phone.   HISTORY OF PRESENT ILLNESS:  Follow-up with [name].  No acute illnesses or falls since last visit.  Her nutritional intake has increased very slightly per staff.  She remains very under weight at less than 80#  CODE STATUS:  DNAR  PPS: 30% with bites HOSPICE ELIGIBILITY/DIAGNOSIS: YES/ Metastatic breast cancer but receiving physical therapy and declined by patient  PAST MEDICAL HISTORY:  Past Medical History:  Diagnosis Date  . Allergy   . Hypertension     PHYSICAL EXAM:   General: NAD, cachetic frail appearing Cardiovascular: regular rate and rhythm Pulmonary: clear ant fields Abdomen: soft, nontender, + bowel sound Extremities: absence of muscle mass Skin: exposed skin is intact Neurological: A&O x3, speaks in a whisper, weakness  Psych:  Appropriate mood.  Cooperative  Gonzella Lex, NP-C

## 2018-05-28 NOTE — Progress Notes (Signed)
PROGRESS NOTE    Morgan Morrow  NOM:[id number] DOB: Apr 03, 1941 DOA: 05/11/2018 PCP: Morgan Ruff, MD   Brief Narrative:  Morgan Morrow is a 78 y.o. 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  Patient was started on Ibrance 20 days ago as well as decadron and anastrozole.  Today she had abrupt onset this morning of severe abd pain and fullness.  No vomiting nor diarrhea.  No urinary symptoms.  Associated generalized weakness and nausea.  ED Course: T 93.5, Severe acute pancreatitis on CT, WBC 0.5, platelets 43, lipase 3461, AST and ALT in the 600s, AKI BUN 61. Put on cefepime and flagyl.  Hospitalist asked to admit.   Assessment & Plan:   Principal Problem:   Febrile neutropenia (HCC) Active Problems:   Essential hypertension   Cachexia (HCC)   Metastatic breast cancer (HCC)   Malnutrition of moderate degree   Severe acute pancreatitis   Transaminitis   Goals of care, counseling/discussion   Palliative care encounter   Hypothermia Neutropenia Patient presenting from home with progressive decline with acute onset abdominal discomfort.  Recently diagnosed with metastatic breast cancer and started on chemotherapy with Ibrance, anastrozole and Decadron.  On admission patient was noted to have a temperature of 92.1, WBC count of 0.6 with ANC of 0.4.  Lactic acid 1.1.  TSH was 2.311 and random cortisol 6.1.  Urinalysis unrevealing. Patient was started on broad-spectrum antibiotics with cefepime/Flagyl and stress dose steroids. Started on Granix by admitting physician after discussion with on-call oncology Morgan Morrow.  --Aggressive measures now discontinued as transition to comfort measures  Severe acute pancreatitis Presenting with severe abdominal discomfort, acutely.  Was noted to have an elevated lipase of 3461, with an elevated AST of 684 and ALT of 665.  CT abdomen/pelvis notable for  severe pancreatitis with gaseous to dilation of large bowel consistent with possible ileus; no contrast was utilized during the CT so unable to determine if necrotizing infection.  Gallbladder visualized without wall thickening, no biliary duct dilation, no distention, but noted cholelithiasis.  Question etiology secondary to chemotherapy agents. BISAP score 2 (age and AMS). --Care now de-escalated to focus on comfort measures.  Thrombocytopenia DIC panel ordered, with elevated d-dimer of 6.32 and low fibrinogen of 1.82.  No schistocytes seen on peripheral smear.  Platelets 28.  No signs of active bleeding.  Hemoglobin 9.6.  INR 1.03.  --Comfort measures as above  Acute urinary retention No urinary output since admission, bladder scan with 203 mL's. Foley catheter placed on 05/27/2018, will continue now transition to comfort measures.  Right Breast cancer with metastasis Patient follows in outpatient oncology office with Dr. Jana Morrow.  Per family, she has been declining for several years but refused further medical work-up until recently.  CT chest/abdomen/pelvis Jan 2020 notable for extensive osteolytic abnormalities, liver lesions, and multiple pulmonary nodules bilaterally.  Underwent biopsy of right breast on 04/16/2018 that was remarkable for estrogen and progesterone receptor strongly positive, H ER-2 nonamplified.  MR brain with/without contrast 04/26/2018 notable for multiple calvarial metastasis, dural thickening compatible with dural invasion.  Started on anastrozole, Ibrance, and Decadron.  Was due to start denosumab/xgeva on 05/28/2018. Discussed extensively with daughter Morgan Morrow and granddaughter Morgan Morrow, patient has extremely grim diagnosis and may not survive this hospitalization, they seem to be understanding as they thought initially she should transition to hospice previously given her significant disease burden.  Palliative care was consulted  and continues to follow.  Overnight patient has  been transitioned to comfort measures given her severe illness and disease burden. --Continue supportive care with pain control, glycopyrrolate, Haldol, Ativan --Case management for possible transfer to hospice home  Adult failure to thrive Severe protein calorie malnutrition Urology likely from progressive stage IV breast cancer with metastasis to bone, lung, liver, and scalp.  Patient's BMI is 15.62.  Apparently has hid her skin findings surrounding her right breast per oncology's notes from her family and physicians for many years.  Continue to progressively decline which prompted her family to assist her seeking care in the ED initially back in January when this was all diagnosed. --Palliative care following, appreciate assistance, transition to comfort measures on 05/27/2018.   --Possible transfer to residential hospice if able   DVT prophylaxis: None, comfort measures Code Status: DNR Family Communication: None Disposition Plan: Hospice, likely residential; but may not survive hospitalization   Consultants:   Palliative care  Gastroenterology - signed off 05/27/2018  Procedures:   Foley catheter 05/27/2018  Antimicrobials:   Cefepime 2/22 - 2/23  Flagyl 2/22 - 2/23   Subjective: Patient seen and examined at bedside, will open eyes and mouth to command.  Nonverbal.  No family present.  Transition to comfort measures by family yesterday.  No concerns overnight per nursing staff.    Objective: Vitals:   05/27/18 2000 05/27/18 2100 05/27/18 2200 05/28/18 0527  BP: 110/65 103/67 114/64 109/68  Pulse: 83 85 81 79  Resp: 11 11 (!) 1 (!) 6  Temp:    (!) 97.3 F (36.3 C)  TempSrc:    Oral  SpO2: 94% 96% 96% 99%  Weight:      Height:        Intake/Output Summary (Last 24 hours) at 05/28/2018 1417 Last data filed at 05/27/2018 2245 Gross per 24 hour  Intake 421.64 ml  Output 100 ml  Net 321.64 ml   Filed Weights   05/20/2018 1612  Weight: 36.3 kg     Examination:  GEN: 78 yo CF in NAD, thin/cachectic in appearance, nonverbal, opens eyes/mouth to command HEENT: NCAT, PERRL, EOMI, sclera clear, mucous membranes PULM: CTAB w/o wheezes/crackles, normal respiratory effort, on room air CV: RRR w/o M/G/R GI: abd soft, mild epigastric tenderness, nondistended, NABS, no R/G/M MSK: no peripheral edema, moves all extremities independently NEURO: no focal deficits, nonverbal, will move head to command, unsure if appropriate or not PSYCH: Unable to fully assess given patient's mental status Integumentary: Stage II sacral ulcer    Data Reviewed: I have personally reviewed following labs and imaging studies  CBC: Recent Labs  Lab 05/16/2018 1609 05/14/2018 2154 05/27/18 0349  WBC 0.5*  --  0.6*  NEUTROABS 0.4*  --   --   HGB 11.7*  --  9.6*  HCT 36.3  --  31.1*  MCV 94.5  --  98.1  PLT 43* 33* 28*   Basic Metabolic Panel: Recent Labs  Lab 05/29/2018 1609 05/27/18 0349  NA 134* 137  K 4.3 4.2  CL 100 110  CO2 24 22  GLUCOSE 95 85  BUN 61* 62*  CREATININE 1.27* 1.33*  CALCIUM 8.9 8.1*   GFR: Estimated Creatinine Clearance: 19.7 mL/min (A) (by C-G formula based on SCr of 1.33 mg/dL (H)). Liver Function Tests: Recent Labs  Lab 05/23/2018 1609 05/27/18 0349  AST 684* 482*  ALT 665* 527*  ALKPHOS 255* 204*  BILITOT 1.3* 1.2  PROT 5.7* 4.4*  ALBUMIN 3.0* 2.3*  Recent Labs  Lab 05/22/2018 1609  LIPASE 3,461*   No results for input(s): AMMONIA in the last 168 hours. Coagulation Profile: Recent Labs  Lab 05/18/2018 2154  INR 1.03   Cardiac Enzymes: No results for input(s): CKTOTAL, CKMB, CKMBINDEX, TROPONINI in the last 168 hours. BNP (last 3 results) No results for input(s): PROBNP in the last 8760 hours. HbA1C: No results for input(s): HGBA1C in the last 72 hours. CBG: No results for input(s): GLUCAP in the last 168 hours. Lipid Profile: No results for input(s): CHOL, HDL, LDLCALC, TRIG, CHOLHDL, LDLDIRECT in the  last 72 hours. Thyroid Function Tests: Recent Labs    05/06/2018 1616  TSH 2.311   Anemia Panel: No results for input(s): VITAMINB12, FOLATE, FERRITIN, TIBC, IRON, RETICCTPCT in the last 72 hours. Sepsis Labs: Recent Labs  Lab 05/10/2018 1609 05/13/2018 2154  LATICACIDVEN 1.7 1.1    Recent Results (from the past 240 hour(s))  Blood culture (routine x 2)     Status: None (Preliminary result)   Collection Time: 05/24/2018  4:16 PM  Result Value Ref Range Status   Specimen Description BLOOD RIGHT ANTECUBITAL  Final   Special Requests   Final    BOTTLES DRAWN AEROBIC ONLY Blood Culture adequate volume Performed at [facility] [address], [place], [place] [address]    Culture NO GROWTH < 24 HOURS  Final   Report Status PENDING  Incomplete  Blood culture (routine x 2)     Status: None (Preliminary result)   Collection Time: 05/27/2018  9:54 PM  Result Value Ref Range Status   Specimen Description BLOOD RIGHT ANTECUBITAL  Final   Special Requests   Final    BOTTLES DRAWN AEROBIC ONLY Blood Culture adequate volume Performed at [facility] [address] [place], [place] [address]    Culture NO GROWTH < 24 HOURS  Final   Report Status PENDING  Incomplete  MRSA PCR Screening     Status: None   Collection Time: 05/21/2018 10:08 PM  Result Value Ref Range Status   MRSA by PCR NEGATIVE NEGATIVE Final    Comment:        The GeneXpert MRSA Assay (FDA approved for NASAL specimens only), is one component of a comprehensive MRSA colonization surveillance program. It is not intended to diagnose MRSA infection nor to guide or monitor treatment for MRSA infections. Performed at [facility], [place] [address], [place], [place] [address]          Radiology Studies: Ct Abdomen Pelvis Wo Contrast  Result Date: 05/06/2018 CLINICAL DATA:  Metastatic breast cancer. Generalized weakness and generalized abdominal pain. EXAM: CT  ABDOMEN AND PELVIS WITHOUT CONTRAST TECHNIQUE: Multidetector CT imaging of the abdomen and pelvis was performed following the standard protocol without IV contrast. COMPARISON:  04/13/2018 CT abdomen/pelvis. FINDINGS: Lower chest: Previously visualized extensive patchy nodularity at both lung bases on 04/13/2018 CT abdomen study has decreased. For example, a 5 mm medial basilar right lower lobe nodule (series 4/image 23), decreased from 7 mm. Basilar left lower lobe 7 mm nodule (series 4/image 18), decreased from 10 mm. Peripheral right lower lobe 7 mm nodule (series 4/image 9), decreased from 10 mm. Coronary atherosclerosis. Hepatobiliary: Normal liver size. Previously visualized hypodense liver masses are less conspicuous on today's noncontrast CT, however appear stable to mildly decreased. For example, a superior right liver 0.9 cm lesion (series 2/image 9), slightly decreased from 1.1 cm. Anterior inferior right liver 1.8 cm lesion (series  2/image 20), stable. Posterior inferior right liver 1.0 cm lesion (series 2/image 17), slightly decreased from 1.2 cm. No appreciable new liver lesions. Cholelithiasis. No gallbladder distention. No gallbladder wall thickening. No biliary ductal dilatation. Pancreas: New diffuse thickening of the pancreas with diffuse peripancreatic fat stranding and peripancreatic fluid, compatible with acute pancreatitis. Inflammatory fluid extends into the bilateral anterior paranephric retroperitoneal spaces. No pancreatic parenchymal gas. Spleen: Normal size. No mass. Adrenals/Urinary Tract: Normal adrenals. No hydronephrosis. No renal stones. Isodense subcentimeter renal cortical lesion in the lateral upper left kidney is too small to characterize and is unchanged. No new contour deforming renal lesions. Normal nondistended bladder. Stomach/Bowel: Normal non-distended stomach. Normal caliber small bowel with no small bowel wall thickening. Appendix not discretely visualized. Mild sigmoid  diverticulosis. Moderate gaseous dilatation of the large bowel, most prominent in the cecum and rectum. No definite large bowel wall thickening. Vascular/Lymphatic: Atherosclerotic nonaneurysmal abdominal aorta. No pathologically enlarged lymph nodes in the abdomen or pelvis. Reproductive: Grossly normal uterus.  No adnexal mass. Other: No free air. Small volume ascites. No focal fluid collection. Musculoskeletal: Widespread patchy sclerotic osseous metastatic disease throughout the visualized axial and proximal appendicular skeleton, not appreciably changed. Chronic moderate L1 vertebral compression fracture. IMPRESSION: 1. Acute pancreatitis, which appears severe on this noncontrast scan, which precludes assessment for pancreatic necrosis. IV contrast was not administered due to acute renal failure. 2. Small volume ascites. 3. Moderate gaseous dilatation of the large bowel compatible with adynamic ileus. No bowel obstruction. 4. Widespread pulmonary nodularity at the lung bases, improved from 04/13/2018 CT. 5. Liver masses are stable to mildly decreased. 6. Widespread patchy sclerotic osseous metastatic disease throughout the visualized skeleton, unchanged. 7. Cholelithiasis. 8.  Aortic Atherosclerosis (ICD10-I70.0). Electronically Signed   By: Ilona Sorrel M.D.   On: 05/20/2018 19:27   Dg Abdomen Acute W/chest  Result Date: 05/25/2018 CLINICAL DATA:  Abdominal pain EXAM: DG ABDOMEN ACUTE W/ 1V CHEST COMPARISON:  CT 04/14/2018 FINDINGS: Heart is normal size. No confluent airspace opacities or effusions. Old right lateral 8th rib fracture, stable. Mild gaseous distention of the colon may reflect mild ileus. No evidence for bowel obstruction. No organomegaly or free air. Scoliosis and degenerative changes in the lumbar spine. IMPRESSION: Gaseous distention of the colon diffusely suggests ileus. No evidence of bowel obstruction or free air. No acute cardiopulmonary disease. Electronically Signed   By: Rolm Baptise M.D.   On: 05/16/2018 18:59        Scheduled Meds: .  HYDROmorphone (DILAUDID) injection  0.5 mg Intravenous Q4H  . LORazepam  0.5 mg Intravenous Q6H   Continuous Infusions: . sodium chloride 100 mL/hr at 05/28/18 3474     LOS: 2 days    Time spent: 27 minutes     J [place] DO [facility] Pager [phone]  If 7PM-7AM, please contact night-coverage [email or web address] Password [facility] 05/28/2018, 2:17 PM

## 2018-05-28 NOTE — Progress Notes (Signed)
**Note Morgan-Identified via Obfuscation** [facility] Consult Note Telephone: [phone]  Fax: [phone]  PATIENT NAME: Morgan Morrow DOB: Apr 08, 1940 MRN: [id number]  PRIMARY CARE PROVIDER:   Leighton Ruff, MD  REFERRING PROVIDER: De Blanch, NP / Dr Morgan Morrow    [facility]  RESPONSIBLE PARTY:    Morgan Morrow (daughter)  [phone]    RECOMMENDATIONS and PLAN:   Palliative Care Encounter Z51.5  1. Anorexia with weight loss:  Related to advanced metastatic breast cancer and failure to thrive.  Remeron 25m nightly.  Nutritional shakes TID.  Comfort meals.  Daily weights. Consider behavioral health evaluation. Very poor prognosis in light of advanced disease. Continue to monitor.  2.  Advanced care planning:  Introduced Palliative vs Hospice care to patient and daughters via phone.  Pt's goals are to return to home and continue to take anti-estrogen med as recommended by Oncologist.  Pt is not aware of potential life prognosis.  She would also like to continue therapies at SNF to regain her strength.  She currently has a DNR status and is without a living will.  MOST form discussed but she is unable to make any decisions at this time.  She would like for me to discuss updates with her daughter.  Joint phone call to daughters Morgan Morrow Morgan Kelpwho report understanding that their mother is very ill and has not been forthcoming with information related to her health prior to this hospital admission.   Explained that she is eligible for Hospice services due to extensive disease process and poor nutritional state.  She will have f/u with oncologist after discharge from SNF an a PET scan.  They would like to readdress potential transition to Hospice at home after F/U with oncology.  Palliative care will return for FU this week.   I spent 60 minutes providing this consultation at bedside and at nurses station with phone conversation with daughters,  from 11:00am to 12:00pm.  More than 50% of the time in this consultation was spent coordinating communication with patient, clinical staff and daughters Morgan Morrow Morgan Morrow   HISTORY OF PRESENT ILLNESS:  Morgan GRANDERSONis a 78y.o. year old female with multiple medical problems including metastatic R breast cancer to the lung, liver and bone.  She also has had progressive weight loss prior to and since hospitalization from 1/22-1/27/20.  She was admitted due to progressive weakness and weight loss.  She was diagnosed with the above cancers during this admission. She has been prescribed Anastrozole for tx. No plans for IV chemo or radiation therapies.   Pt. Normally lives alone in an apartment and has been independent in her ADLs.  Staff reports that she requires assistance with ADLs and she has transferred and walked minimally with a walker while in PT.  Poor appetite.  Weights have ranged from 100# on 1/10 to 80# on 1/31 ad 77# currently.   Palliative Care was asked to help address goals of care.   CODE STATUS:  DNAR  PPS: 30% with bites HOSPICE ELIGIBILITY/DIAGNOSIS: YES/ Metastatic breast cancer but receiving physical therapy.    PAST MEDICAL HISTORY:  Past Medical History:  Diagnosis Date  . Allergy   . Hypertension     PHYSICAL EXAM:   General: NAD, cachetic frail appearing Cardiovascular: regular rate and rhythm Pulmonary: clear ant fields Abdomen: soft, nontender, + bowel sound Extremities: absence of muscle mass Skin: exposed skin is intact Neurological: A&O x3, speaks in a whisper, weakness   SEnid Morrow  Morgan Foley, NP-C

## 2018-05-28 NOTE — Progress Notes (Signed)
Patient turned and repositioned q 3 hours. When patient was repositioned she grimaced. Mouth care was done q 3 hours when repositioned. Lips were lubricated, and patient would wipe it off every time.I asked her how she was doing and she responded  OK. Family has been in to see patient.

## 2018-05-28 NOTE — Telephone Encounter (Signed)
Returned a call to pt's daughter Yarisbel Miranda.  She states that her mother became very ill at home with abdominal pain and she was admitted to the hospital.  She informs me that pt has pancreatitis and is nearing end of life.  She is more comfortable now and she and her siblings are in agreement with comfort care at the hospital. She thanked me for working with her she and her mother.    Support given.         Gonzella Lex, NP-C

## 2018-05-29 NOTE — Progress Notes (Signed)
Assessed need for [facility] with pt daughter. Wishes [facility].   Pt is not connected with parish.    Contacted Fr. Bill at [facility] and left voicemail re: need for [place].  Forwarded to [employer] to monitor pager this morning and continue to arrange priest for sacrament.      Jerene Pitch, MDiv, [facility], [facility]

## 2018-05-29 NOTE — Progress Notes (Signed)
Palliative Medicine RN Note: Per PMT NP Vinie Sill, I reached out to the chaplain & get return calls from both [facility] and Lattie Haw in [facility]. The patient is not affiliated with a parish & would like to receive the [place]. They will find a priest to help with this.  Marjie Skiff Aleathia Purdy, RN, BSN, [facility] Palliative Medicine Team 05/29/2018 9:08 AM Office [phone]

## 2018-05-29 NOTE — Progress Notes (Signed)
PROGRESS NOTE    Morgan Morrow  NOM:[id number] DOB: Apr 03, 1941 DOA: 05/27/2018 PCP: Morgan Morrow   Brief Narrative:  Morgan Morrow is a 78 y.o. 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  Patient was started on Ibrance 20 days ago as well as decadron and anastrozole.  Today she had abrupt onset this morning of severe abd pain and fullness.  No vomiting nor diarrhea.  No urinary symptoms.  Associated generalized weakness and nausea.  ED Course: T 93.5, Severe acute pancreatitis on CT, WBC 0.5, platelets 43, lipase 3461, AST and ALT in the 600s, AKI BUN 61. Put on cefepime and flagyl.  Hospitalist asked to admit.   Assessment & Plan:   Principal Problem:   Febrile neutropenia (HCC) Active Problems:   Essential hypertension   Cachexia (HCC)   Metastatic breast cancer (HCC)   Malnutrition of moderate degree   Severe acute pancreatitis   Transaminitis   Goals of care, counseling/discussion   Palliative care encounter   Hypothermia Neutropenia Patient presenting from home with progressive decline with acute onset abdominal discomfort.  Recently diagnosed with metastatic breast cancer and started on chemotherapy with Ibrance, anastrozole and Decadron.  On admission patient was noted to have a temperature of 92.1, WBC count of 0.6 with ANC of 0.4.  Lactic acid 1.1.  TSH was 2.311 and random cortisol 6.1.  Urinalysis unrevealing. Patient was started on broad-spectrum antibiotics with cefepime/Flagyl and stress dose steroids. Started on Granix by admitting physician after discussion with on-call oncology Morgan Morrow.  --Aggressive measures now discontinued as transition to comfort measures  Severe acute pancreatitis Presenting with severe abdominal discomfort, acutely.  Was noted to have an elevated lipase of 3461, with an elevated AST of 684 and ALT of 665.  CT abdomen/pelvis notable for  severe pancreatitis with gaseous to dilation of large bowel consistent with possible ileus; no contrast was utilized during the CT so unable to determine if necrotizing infection.  Gallbladder visualized without wall thickening, no biliary duct dilation, no distention, but noted cholelithiasis.  Question etiology secondary to chemotherapy agents. BISAP score 2 (age and AMS). --Care now de-escalated to focus on comfort measures.  Thrombocytopenia DIC panel ordered, with elevated d-dimer of 6.32 and low fibrinogen of 1.82.  No schistocytes seen on peripheral smear.  Platelets 28.  No signs of active bleeding.  Hemoglobin 9.6.  INR 1.03.  --Comfort measures as above  Acute urinary retention No urinary output since admission, bladder scan with 203 mL's. Foley catheter placed on 05/27/2018, will continue now transition to comfort measures.  Right Breast cancer with metastasis Patient follows in outpatient oncology office with Dr. Jana Morrow.  Per family, she has been declining for several years but refused further medical work-up until recently.  CT chest/abdomen/pelvis Jan 2020 notable for extensive osteolytic abnormalities, liver lesions, and multiple pulmonary nodules bilaterally.  Underwent biopsy of right breast on 04/16/2018 that was remarkable for estrogen and progesterone receptor strongly positive, H ER-2 nonamplified.  MR brain with/without contrast 04/26/2018 notable for multiple calvarial metastasis, dural thickening compatible with dural invasion.  Started on anastrozole, Ibrance, and Decadron.  Was due to start denosumab/xgeva on 05/28/2018. Discussed extensively with daughter Morgan Morrow and granddaughter Morgan Morrow, patient has extremely grim diagnosis and may not survive this hospitalization, they seem to be understanding as they thought initially she should transition to hospice previously given her significant disease burden.  Palliative care was consulted  and continues to follow.  Overnight patient has  been transitioned to comfort measures given her severe illness and disease burden. --Continue supportive care with pain control, glycopyrrolate, Haldol, Ativan  Adult failure to thrive Severe protein calorie malnutrition Urology likely from progressive stage IV breast cancer with metastasis to bone, lung, liver, and scalp.  Patient's BMI is 15.62.  Apparently has hid her skin findings surrounding her right breast per oncology's notes from her family and physicians for many years.  Continue to progressively decline which prompted her family to assist her seeking care in the ED initially back in January when this was all diagnosed. --Palliative care following, appreciate assistance, transition to comfort measures on 05/27/2018.     DVT prophylaxis: None, comfort measures Code Status: DNR Family Communication: None Disposition Plan: Hospice, likely residential; but may not survive hospitalization   Consultants:   Palliative care  Gastroenterology - signed off 05/27/2018  Procedures:   Foley catheter 05/27/2018  Antimicrobials:   Cefepime 2/22 - 2/23  Flagyl 2/22 - 2/23   Subjective: Patient seen and examined at bedside, daughter Morgan Morrow present.  Patient with decreased respirations.  Patient appears comfortable.  Continue comfort measures.  No overnight concerns per nursing staff.   Objective: Vitals:   05/28/18 0527 05/28/18 2115 05/29/18 0130 05/29/18 0449  BP: 109/68   107/62  Pulse: 79   87  Resp: (!) 6 (!) 6 (!) 3 20  Temp: (!) 97.3 F (36.3 C)   97.8 F (36.6 C)  TempSrc: Oral   Axillary  SpO2: 99%   95%  Weight:      Height:        Intake/Output Summary (Last 24 hours) at 05/29/2018 0857 Last data filed at 05/29/2018 0344 Gross per 24 hour  Intake 2150.56 ml  Output 150 ml  Net 2000.56 ml   Filed Weights   05/14/2018 1612  Weight: 36.3 kg    Examination:  GEN: 78 yo CF in NAD, thin/cachectic in appearance, nonverbal, appears comfortable HEENT: NCAT, PERRL,  EOMI, sclera clear, mucous membranes PULM: CTAB w/o wheezes/crackles, decreased respirations, on room air CV: RRR w/o M/G/R GI: abd soft, mild epigastric tenderness, nondistended, NABS, no R/G/M MSK: no peripheral edema, moves all extremities independently NEURO: no focal deficits, nonverbal PSYCH: Unable to fully assess given patient's mental status Integumentary: Stage II sacral ulcer    Data Reviewed: I have personally reviewed following labs and imaging studies  CBC: Recent Labs  Lab 05/27/2018 1609 05/07/2018 2154 05/27/18 0349  WBC 0.5*  --  0.6*  NEUTROABS 0.4*  --   --   HGB 11.7*  --  9.6*  HCT 36.3  --  31.1*  MCV 94.5  --  98.1  PLT 43* 33* 28*   Basic Metabolic Panel: Recent Labs  Lab 05/13/2018 1609 05/27/18 0349  NA 134* 137  K 4.3 4.2  CL 100 110  CO2 24 22  GLUCOSE 95 85  BUN 61* 62*  CREATININE 1.27* 1.33*  CALCIUM 8.9 8.1*   GFR: Estimated Creatinine Clearance: 19.7 mL/min (A) (by C-G formula based on SCr of 1.33 mg/dL (H)). Liver Function Tests: Recent Labs  Lab 05/13/2018 1609 05/27/18 0349  AST 684* 482*  ALT 665* 527*  ALKPHOS 255* 204*  BILITOT 1.3* 1.2  PROT 5.7* 4.4*  ALBUMIN 3.0* 2.3*   Recent Labs  Lab 05/25/2018 1609  LIPASE 3,461*   No results for input(s): AMMONIA in the last 168 hours. Coagulation Profile: Recent Labs  Lab 05/06/2018 2154  INR 1.03   Cardiac Enzymes: No results for input(s): CKTOTAL, CKMB, CKMBINDEX, TROPONINI in the last 168 hours. BNP (last 3 results) No results for input(s): PROBNP in the last 8760 hours. HbA1C: No results for input(s): HGBA1C in the last 72 hours. CBG: No results for input(s): GLUCAP in the last 168 hours. Lipid Profile: No results for input(s): CHOL, HDL, LDLCALC, TRIG, CHOLHDL, LDLDIRECT in the last 72 hours. Thyroid Function Tests: Recent Labs    05/29/2018 1616  TSH 2.311   Anemia Panel: No results for input(s): VITAMINB12, FOLATE, FERRITIN, TIBC, IRON, RETICCTPCT in the last  72 hours. Sepsis Labs: Recent Labs  Lab 05/31/2018 1609 05/18/2018 2154  LATICACIDVEN 1.7 1.1    Recent Results (from the past 240 hour(s))  Blood culture (routine x 2)     Status: None (Preliminary result)   Collection Time: 05/29/2018  4:16 PM  Result Value Ref Range Status   Specimen Description   Final    BLOOD RIGHT ANTECUBITAL Performed at [facility] [address] [place], [place] [address]    Special Requests   Final    BOTTLES DRAWN AEROBIC ONLY Blood Culture adequate volume Performed at [facility] [address], [place], [place] [address]    Culture   Final    NO GROWTH 2 DAYS Performed at [facility] [address], [place], [place] [address]    Report Status PENDING  Incomplete  Blood culture (routine x 2)     Status: None (Preliminary result)   Collection Time: 05/25/2018  9:54 PM  Result Value Ref Range Status   Specimen Description   Final    BLOOD RIGHT ANTECUBITAL Performed at [facility] [address] [place], [place] [address]    Special Requests   Final    BOTTLES DRAWN AEROBIC ONLY Blood Culture adequate volume Performed at [facility] [address], [place], [place] [address]    Culture   Final    NO GROWTH 2 DAYS Performed at [facility] [address], [place], [place] [address]    Report Status PENDING  Incomplete  MRSA PCR Screening     Status: None   Collection Time: 05/27/2018 10:08 PM  Result Value Ref Range Status   MRSA by PCR NEGATIVE NEGATIVE Final    Comment:        The GeneXpert MRSA Assay (FDA approved for NASAL specimens only), is one component of a comprehensive MRSA colonization surveillance program. It is not intended to diagnose MRSA infection nor to guide or monitor treatment for MRSA infections. Performed at [facility] [place], [place] [address] [place],  [address]          Radiology Studies: No  results found.      Scheduled Meds: .  HYDROmorphone (DILAUDID) injection  0.5 mg Intravenous Q4H  . LORazepam  0.5 mg Intravenous Q6H   Continuous Infusions: . sodium chloride 100 mL/hr at 05/29/18 0241     LOS: 3 days    Time spent: 25 minutes    Dewon Mendizabal J [place] DO [facility] Pager [phone]  If 7PM-7AM, please contact night-coverage [email or web address] Password [facility] 05/29/2018, 8:57 AM

## 2018-05-29 NOTE — Progress Notes (Signed)
Palliative:  I met today at Morgan Morrow's bedside. Morgan Morrow is at bedside. She has been waiting on priest to arrive to provide [employer] of [facility] [place]. We discussed the changes to her mother such as apneic episodes, drawing back of facial muscles, and I feel that she continues to progress towards end of life and anticipate she could pass within hours and likely not to live past 24 hours. With this I do not feel a transfer to hospice would be in patient/family best interest and will continue to provide comfort care during hospital stay.   Exam: Unresponsive. Cachectic. No distress. Extremities cool to touch. Apneic episodes noted.   Plan: - Anticipate hospital death - Continue medications as ordered to ensure comfort  25 min  Vinie Sill, NP Palliative Medicine Team Pager # [phone] (M-F 8a-5p) Team Phone # [phone] (Nights/Weekends)

## 2018-05-30 DIAGNOSIS — N179 Acute kidney failure, unspecified: Secondary | ICD-10-CM

## 2018-05-30 MED ORDER — GLYCOPYRROLATE 0.2 MG/ML IJ SOLN
0.4000 mg | INTRAMUSCULAR | Status: DC
  Administered 2018-05-30 – 2018-06-01 (×6): 0.4 mg via INTRAVENOUS
  Filled 2018-05-30 (×7): qty 2

## 2018-05-30 MED ORDER — HYDROMORPHONE BOLUS VIA INFUSION
0.5000 mg | INTRAVENOUS | Status: DC | PRN
  Administered 2018-05-30: 0.5 mg via INTRAVENOUS
  Filled 2018-05-30: qty 1

## 2018-05-30 MED ORDER — SODIUM CHLORIDE 0.9 % IV SOLN
0.5000 mg/h | INTRAVENOUS | Status: DC
  Administered 2018-05-30 – 2018-05-31 (×2): 0.5 mg/h via INTRAVENOUS
  Filled 2018-05-30 (×2): qty 2.5

## 2018-05-30 MED ORDER — HYDROMORPHONE HCL 1 MG/ML IJ SOLN: 0.5000 mg | INTRAMUSCULAR | Status: DC | PRN

## 2018-05-30 NOTE — Care Management Important Message (Signed)
Important Message  Patient Details  Name: Morgan Morrow MRN: [id number] Date of Birth: September 29, 1940   Medicare Important Message Given:  Yes    Kerin Salen 05/30/2018, 11:24 AMImportant Message  Patient Details  Name: Morgan Morrow MRN: [id number] Date of Birth: [birth year]   Medicare Important Message Given:  Yes    Kerin Salen 05/30/2018, 11:24 AM

## 2018-05-30 NOTE — Progress Notes (Signed)
[facility]                                                                              Patient Demographics  Morgan Morrow, is a 78 y.o. female, DOB - 06/05/1940, HER:[id number]  Admit date - 05/12/2018   Admitting Physician Etta Quill, DO  Outpatient Primary MD for the patient is Leighton Ruff, MD  Outpatient specialists:   LOS - 4  days   Medical records reviewed and are as summarized below:    Chief Complaint  Patient presents with  . Abdominal Pain       Brief summary   Morgan Morrow a 78 y.o.femalewith medical history significant ofHTN, metastatic BRCAdiagnosis just last month with mets to lung, brain, bone, scalp, liver. Patient was started on Ibrance20days ago as well as decadron and anastrozole.On admission, she had abrupt onset of severe abdpain and fullness. No vomiting nor diarrhea. No urinary symptoms. Associated generalized weakness and nausea. ED Course:T 93.5, Severe acute pancreatitis on CT, WBC 0.5, platelets 43, lipase 3461, AST and ALT in the 600s, AKI BUN 61. Put on cefepime and flagyl. Hospitalistasked to admit.   Assessment & Plan   Hypothermia with neutropenia -Patient presented from home with progressive decline, acute onset abdominal pain.  She was recently diagnosed with metastatic breast CA and started on chemotherapy with Ibrance, anastrozole and Decadron.  -  Patient was started on broad-spectrum antibiotics with cefepime/Flagyl and stress dose steroids.  Patient was started on Granix.   --Patient has been transitioned to comfort measures, anticipating hospital death -Palliative care following  Severe acute pancreatitis -Patient presented with pancreatitis, lipase of 3461, transaminitis. - CT abdomen/pelvis notable for severe pancreatitis with gaseous to dilation of large bowel consistent with possible ileus; no contrast was utilized during the CT so unable to determine if necrotizing infection.   Gallbladder visualized without wall thickening, no biliary duct dilation, no distention, but noted cholelithiasis.   -Possibly could have been due to chemotherapy, now transitioned to comfort measures, anticipating hospital death  Thrombocytopenia -DIC panel showed elevated d-dimer of 6.32, fibrinogen 1.8, no schistocytes.  Platelets 28, INR 1.0 -Comfort measures  Acute urinary retention Foley catheter was placed on 2/23, now transitioned to comfort measures  Right breast cancer with metastasis -Patient had followed Dr. Jana Hakim, per family has been declining for several years but refused further medical work-up until recently. - CT chest/abdomen/pelvis Jan 2020 notable for extensive osteolytic abnormalities, liver lesions, and multiple pulmonary nodules bilaterally.  Underwent biopsy of right breast on 04/16/2018 that was remarkable for estrogen and progesterone receptor strongly positive, H ER-2 nonamplified.  MR brain with/without contrast 04/26/2018 notable for multiple calvarial metastasis, dural thickening compatible with dural invasion.  Started on anastrozole, Ibrance, and Decadron.  Was due to start denosumab/xgeva on 05/28/2018. 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, lung, liver and scalp, BMI 15.6 -Transition to comfort measures,   Code Status: DNR DVT Prophylaxis:   Family Communication: Discussed in detail with the patient,  all imaging results, lab results explained to family members at the bedside   Disposition Plan: Anticipating hospital death  Time Spent in minutes   20 minutes  Procedures:    Consultants:   Palliative care  Antimicrobials:   Anti-infectives (From admission, onward)   Start     Dose/Rate Route Frequency Ordered Stop   05/27/18 0600  ceFEPIme (MAXIPIME) 1 g in sodium  chloride 0.9 % 100 mL IVPB  Status:  Discontinued     1 g 200 mL/hr over 30 Minutes Intravenous Every 12 hours 05/16/2018 2033 05/27/18 1556   05/06/2018 1815  ceFEPIme (MAXIPIME) 2 g in sodium chloride 0.9 % 100 mL IVPB     2 g 200 mL/hr over 30 Minutes Intravenous  Once 05/25/2018 1800 05/23/2018 1848   05/25/2018 1815  metroNIDAZOLE (FLAGYL) IVPB 500 mg  Status:  Discontinued     500 mg 100 mL/hr over 60 Minutes Intravenous Every 8 hours 05/28/2018 1800 05/27/18 1556   05/25/2018 1815  vancomycin (VANCOCIN) IVPB 1000 mg/200 mL premix     1,000 mg 200 mL/hr over 60 Minutes Intravenous  Once 05/16/2018 1800 05/27/18 0700          Medications  Scheduled Meds: . glycopyrrolate  0.4 mg Intravenous Q4H  . LORazepam  0.5 mg Intravenous Q6H   Continuous Infusions: . HYDROmorphone 0.5 mg/hr (05/30/18 0940)   PRN Meds:.acetaminophen **OR** acetaminophen, antiseptic oral rinse, haloperidol **OR** haloperidol **OR** haloperidol lactate, HYDROmorphone, HYDROmorphone (DILAUDID) injection, LORazepam, ondansetron **OR** ondansetron (ZOFRAN) IV, polyvinyl alcohol      Subjective:   Morgan Morrow was seen and examined today.  Unresponsive, shallow breathing  Objective:   Vitals:   05/29/18 2358 05/30/18 0118 05/30/18 0546 05/30/18 0559  BP:   (!) 101/59   Pulse:   91   Resp: (!) 5 (!) 5 (!) 7 (!) 6  Temp:      TempSrc:      SpO2:   (!) 72%   Weight:      Height:        Intake/Output Summary (Last 24 hours) at 05/30/2018 1511 Last data filed at 05/30/2018 1300 Gross per 24 hour  Intake 800 ml  Output 500 ml  Net 300 ml     Wt Readings from Last 3 Encounters:  05/25/2018 36.3 kg  05/04/18 36.6 kg  04/25/18 41.7 kg     Exam  General: Unresponsive  Eyes:   HEENT:    Cardiovascular: S1 S2 auscultated,  Regular rate and rhythm.  Respiratory: Shallow breathing  Gastrointestinal: Soft, nontender, nondistended, + bowel sounds  Ext: no pedal edema bilaterally, cachectic  Neuro:  Unresponsive  Musculoskeletal:  Skin: No rashes  Psych: Unresponsive   Data Reviewed:  I have personally reviewed following labs and imaging studies  Micro Results Recent Results (from the past 240 hour(s))  Blood culture (routine x 2)     Status: None (Preliminary result)   Collection Time: 05/18/2018  4:16 PM  Result Value Ref Range Status   Specimen Description   Final    BLOOD RIGHT ANTECUBITAL Performed at [facility] [address], [place], [place] [address]    Special Requests   Final    BOTTLES DRAWN AEROBIC ONLY Blood Culture adequate volume Performed at [facility] [address] [place], [place] [address]    Culture   Final    NO GROWTH 4 DAYS Performed at [facility], [place] [address] [place], [place] [address]  Report Status PENDING  Incomplete  Blood culture (routine x 2)     Status: None (Preliminary result)   Collection Time: 05/22/2018  9:54 PM  Result Value Ref Range Status   Specimen Description   Final    BLOOD RIGHT ANTECUBITAL Performed at [facility] [address], [place], [place] [address]    Special Requests   Final    BOTTLES DRAWN AEROBIC ONLY Blood Culture adequate volume Performed at [facility] [address], [place], [place] [address]    Culture   Final    NO GROWTH 4 DAYS Performed at [facility], [place] [address], [place], [place] [address]    Report Status PENDING  Incomplete  MRSA PCR Screening     Status: None   Collection Time: 05/25/2018 10:08 PM  Result Value Ref Range Status   MRSA by PCR NEGATIVE NEGATIVE Final    Comment:        The GeneXpert MRSA Assay (FDA approved for NASAL specimens only), is one component of a comprehensive MRSA colonization surveillance program. It is not intended to diagnose MRSA infection nor to guide or monitor treatment for MRSA infections. Performed at [facility], [place]  [address] [place], [place] [address]     Radiology Reports Ct Abdomen Pelvis Wo Contrast  Result Date: 05/23/2018 CLINICAL DATA:  Metastatic breast cancer. Generalized weakness and generalized abdominal pain. EXAM: CT ABDOMEN AND PELVIS WITHOUT CONTRAST TECHNIQUE: Multidetector CT imaging of the abdomen and pelvis was performed following the standard protocol without IV contrast. COMPARISON:  04/13/2018 CT abdomen/pelvis. FINDINGS: Lower chest: Previously visualized extensive patchy nodularity at both lung bases on 04/13/2018 CT abdomen study has decreased. For example, a 5 mm medial basilar right lower lobe nodule (series 4/image 23), decreased from 7 mm. Basilar left lower lobe 7 mm nodule (series 4/image 18), decreased from 10 mm. Peripheral right lower lobe 7 mm nodule (series 4/image 9), decreased from 10 mm. Coronary atherosclerosis. Hepatobiliary: Normal liver size. Previously visualized hypodense liver masses are less conspicuous on today's noncontrast CT, however appear stable to mildly decreased. For example, a superior right liver 0.9 cm lesion (series 2/image 9), slightly decreased from 1.1 cm. Anterior inferior right liver 1.8 cm lesion (series 2/image 20), stable. Posterior inferior right liver 1.0 cm lesion (series 2/image 17), slightly decreased from 1.2 cm. No appreciable new liver lesions. Cholelithiasis. No gallbladder distention. No gallbladder wall thickening. No biliary ductal dilatation. Pancreas: New diffuse thickening of the pancreas with diffuse peripancreatic fat stranding and peripancreatic fluid, compatible with acute pancreatitis. Inflammatory fluid extends into the bilateral anterior paranephric retroperitoneal spaces. No pancreatic parenchymal gas. Spleen: Normal size. No mass. Adrenals/Urinary Tract: Normal adrenals. No hydronephrosis. No renal stones. Isodense subcentimeter renal cortical lesion in the lateral upper left kidney is too small to characterize and is unchanged. No  new contour deforming renal lesions. Normal nondistended bladder. Stomach/Bowel: Normal non-distended stomach. Normal caliber small bowel with no small bowel wall thickening. Appendix not discretely visualized. Mild sigmoid diverticulosis. Moderate gaseous dilatation of the large bowel, most prominent in the cecum and rectum. No definite large bowel wall thickening. Vascular/Lymphatic: Atherosclerotic nonaneurysmal abdominal aorta. No pathologically enlarged lymph nodes in the abdomen or pelvis. Reproductive: Grossly normal uterus.  No adnexal mass. Other: No free air. Small volume ascites. No focal fluid collection. Musculoskeletal: Widespread patchy sclerotic osseous metastatic disease throughout the visualized axial and proximal appendicular skeleton, not appreciably changed. Chronic moderate L1 vertebral compression fracture.  IMPRESSION: 1. Acute pancreatitis, which appears severe on this noncontrast scan, which precludes assessment for pancreatic necrosis. IV contrast was not administered due to acute renal failure. 2. Small volume ascites. 3. Moderate gaseous dilatation of the large bowel compatible with adynamic ileus. No bowel obstruction. 4. Widespread pulmonary nodularity at the lung bases, improved from 04/13/2018 CT. 5. Liver masses are stable to mildly decreased. 6. Widespread patchy sclerotic osseous metastatic disease throughout the visualized skeleton, unchanged. 7. Cholelithiasis. 8.  Aortic Atherosclerosis (ICD10-I70.0). Electronically Signed   By: Ilona Sorrel M.D.   On: 05/25/2018 19:27   Dg Abdomen Acute W/chest  Result Date: 05/16/2018 CLINICAL DATA:  Abdominal pain EXAM: DG ABDOMEN ACUTE W/ 1V CHEST COMPARISON:  CT 04/14/2018 FINDINGS: Heart is normal size. No confluent airspace opacities or effusions. Old right lateral 8th rib fracture, stable. Mild gaseous distention of the colon may reflect mild ileus. No evidence for bowel obstruction. No organomegaly or free air. Scoliosis and  degenerative changes in the lumbar spine. IMPRESSION: Gaseous distention of the colon diffusely suggests ileus. No evidence of bowel obstruction or free air. No acute cardiopulmonary disease. Electronically Signed   By: Rolm Baptise M.D.   On: 05/21/2018 18:59    Lab Data:  CBC: Recent Labs  Lab 05/31/2018 1609 05/18/2018 2154 05/27/18 0349  WBC 0.5*  --  0.6*  NEUTROABS 0.4*  --   --   HGB 11.7*  --  9.6*  HCT 36.3  --  31.1*  MCV 94.5  --  98.1  PLT 43* 33* 28*   Basic Metabolic Panel: Recent Labs  Lab 05/25/2018 1609 05/27/18 0349  NA 134* 137  K 4.3 4.2  CL 100 110  CO2 24 22  GLUCOSE 95 85  BUN 61* 62*  CREATININE 1.27* 1.33*  CALCIUM 8.9 8.1*   GFR: Estimated Creatinine Clearance: 19.7 mL/min (A) (by C-G formula based on SCr of 1.33 mg/dL (H)). Liver Function Tests: Recent Labs  Lab 05/08/2018 1609 05/27/18 0349  AST 684* 482*  ALT 665* 527*  ALKPHOS 255* 204*  BILITOT 1.3* 1.2  PROT 5.7* 4.4*  ALBUMIN 3.0* 2.3*   Recent Labs  Lab 05/21/2018 1609  LIPASE 3,461*   No results for input(s): AMMONIA in the last 168 hours. Coagulation Profile: Recent Labs  Lab 05/30/2018 2154  INR 1.03   Cardiac Enzymes: No results for input(s): CKTOTAL, CKMB, CKMBINDEX, TROPONINI in the last 168 hours. BNP (last 3 results) No results for input(s): PROBNP in the last 8760 hours. HbA1C: No results for input(s): HGBA1C in the last 72 hours. CBG: No results for input(s): GLUCAP in the last 168 hours. Lipid Profile: No results for input(s): CHOL, HDL, LDLCALC, TRIG, CHOLHDL, LDLDIRECT in the last 72 hours. Thyroid Function Tests: No results for input(s): TSH, T4TOTAL, FREET4, T3FREE, THYROIDAB in the last 72 hours. Anemia Panel: No results for input(s): VITAMINB12, FOLATE, FERRITIN, TIBC, IRON, RETICCTPCT in the last 72 hours. Urine analysis:    Component Value Date/Time   COLORURINE STRAW (A) 04/25/2018 1655   APPEARANCEUR HAZY (A) 04/25/2018 1655   LABSPEC 1.009  04/25/2018 1655   PHURINE 8.0 04/25/2018 1655   GLUCOSEU NEGATIVE 04/25/2018 1655   HGBUR NEGATIVE 04/25/2018 [place] 04/25/2018 1655   BILIRUBINUR neg 01/02/2014 1034   KETONESUR 5 (A) 04/25/2018 1655   PROTEINUR NEGATIVE 04/25/2018 1655   UROBILINOGEN 0.2 01/02/2014 1034   NITRITE NEGATIVE 04/25/2018 1655   LEUKOCYTESUR NEGATIVE 04/25/2018 [place]  [facility] 05/30/2018, 3:11 PM  Pager: [phone] Between 7am to 7pm - call Pager - [phone]  After 7pm go to [email or web address] - password [email or web address]  Call night coverage person covering after 7pm

## 2018-05-30 NOTE — Progress Notes (Signed)
   05/30/18 1000  Clinical Encounter Type  Visited With Patient and family together  Visit Type Initial;Spiritual support;Psychological support;Patient actively dying  Referral From Nurse  Consult/Referral To Chaplain  Spiritual Encounters  Spiritual Needs Ritual;Emotional;Other (Comment) (Spiritual Care Conversation/Support)  Stress Factors  Patient Stress Factors Not reviewed  Family Stress Factors Health changes;Major life changes;Other (Comment) (Priest Request )   I visited with Morgan Morrow per referral and spiritual care consult. Morgan Morrow was not able to speak with me during my visit, but her son and daughter were present at the bedside. They stated that they had requested a priest yesterday to perform the sacrament of the sick, but that he had not visited yet. 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  I contacted [facility] and Father Rush Landmark told me that he would stop by to visit this morning. I relayed this information over to Morgan Morrow's children.  I provided spiritual support and grief support for the family.  Please, contact Spiritual Care for further assistance.   Chaplain Morgan Morrow M.Div., [facility]

## 2018-05-30 NOTE — Progress Notes (Signed)
Palliative:  I met today at Ms. Glance's bedside. She is unresponsive and no family at bedside. Dilaudid infusion was ordered at request of family to further ensure comfort. Ms. Shippee appears comfortable. She continues to have apnea with very shallow respirations. Actively dying. RN reports that priest has come today to administer Sacrament of the Sick - appreciate assistance from chaplain to arrange.   Exam: Unresponsive. No distress. Apneic. Shallow breathes.   Plan: - Low dose dilaudid infusion began per family wishes.  - Anticipate hospital death.   15 min  Vinie Sill, NP Palliative Medicine Team Pager # [phone] (M-F 8a-5p) Team Phone # [phone] (Nights/Weekends)

## 2018-05-30 NOTE — Progress Notes (Signed)
Patient has edema forming in both arms-I have called the daughter due to a decrease in respirations as well has patient developing the "death rattle" within the last 30 mins - oxygen stats have dropped-daughter asked when the morphine would be started I advised her that would be up to the physician when he came around to assess her - I would rather not make that decision at least until the daughter gets here due to patient's current state.

## 2018-05-31 DIAGNOSIS — R6510 Systemic inflammatory response syndrome (SIRS) of non-infectious origin without acute organ dysfunction: Secondary | ICD-10-CM

## 2018-05-31 DIAGNOSIS — K858 Other acute pancreatitis without necrosis or infection: Principal | ICD-10-CM

## 2018-05-31 LAB — CULTURE, BLOOD (ROUTINE X 2)
Culture: NO GROWTH
Culture: NO GROWTH
Special Requests: ADEQUATE
Special Requests: ADEQUATE

## 2018-05-31 NOTE — Progress Notes (Signed)
CSW has been following for disposition needs- pt receiving comfort care and anticipating hospital demise.   Sharren Bridge, MSW, LCSW Clinical Social Work 05/31/2018 [phone]

## 2018-05-31 NOTE — Progress Notes (Signed)
Daily Progress Note   Patient Name: Morgan Morrow       Date: 05/31/2018 DOB: [birth year]  Age: 78 y.o. MRN#: [id number] Attending Physician: Mendel Corning, MD Primary Care Physician: Leighton Ruff, MD Admit Date: 05/14/2018  Reason for Consultation/Follow-up: Establishing goals of care, Non pain symptom management, Pain control and Terminal Care  Subjective: I saw and examined Morgan Morrow today.  She is actively dying.  Somnolent, does not arouse.  No family present at bedside.  No signs of distress observing her for several minutes.  Apneic periods noted.  Length of Stay: 5  Current Medications: Scheduled Meds:  . glycopyrrolate  0.4 mg Intravenous Q4H  . LORazepam  0.5 mg Intravenous Q6H    Continuous Infusions: . HYDROmorphone 0.5 mg/hr (05/30/18 1517)    PRN Meds: acetaminophen **OR** acetaminophen, antiseptic oral rinse, haloperidol **OR** haloperidol **OR** haloperidol lactate, HYDROmorphone, HYDROmorphone (DILAUDID) injection, LORazepam, ondansetron **OR** ondansetron (ZOFRAN) IV, polyvinyl alcohol  Physical Exam        General: Somnolent with periods of apnea.  No signs of distress or agitation HEENT: No bruits, no goiter, no JVD Heart: Regular. Lungs: Diminished air movement Abdomen: Soft, nondistended.  Ext: Cool, no significant edema  Vital Signs: BP (!) 101/59 (BP Location: Right Arm)   Pulse 91   Temp 97.8 F (36.6 C) (Axillary)   Resp 12   Ht 5' (1.524 m)   Wt 36.3 kg   SpO2 (!) 72% Comment: RN notified  BMI 15.62 kg/m  SpO2: SpO2: (!) 72 %(RN notified) O2 Device: O2 Device: Room Air O2 Flow Rate:    Intake/output summary:   Intake/Output Summary (Last 24 hours) at 05/31/2018 0956 Last data filed at 05/31/2018 0600 Gross per 24 hour  Intake  6.61 ml  Output 270 ml  Net -263.39 ml   LBM: Last BM Date: 05/25/18 Baseline Weight: Weight: 36.3 kg Most recent weight: Weight: 36.3 kg       Palliative Assessment/Data:    Flowsheet Rows     Most Recent Value  Intake Tab  Referral Department  Hospitalist  Unit at Time of Referral  ER  Palliative Care Primary Diagnosis  Cancer  Date Notified  05/27/18  Palliative Care Type  New Palliative care  Reason for referral  Clarify Goals of Care  Date of Admission  05/08/2018  Date first seen by Palliative Care  05/27/18  # of days Palliative referral response time  0 Day(s)  # of days IP prior to Palliative referral  1  Clinical Assessment  Psychosocial & Spiritual Assessment  Palliative Care Outcomes      Patient Active Problem List   Diagnosis Date Noted  . Goals of care, counseling/discussion   . Palliative care encounter   . Febrile neutropenia ([place]) 05/12/2018  . Severe acute pancreatitis 05/13/2018  . Transaminitis 05/24/2018  . Bone metastases ([place]) 05/04/2018  . Lung metastases ([place]) 05/04/2018  . Liver metastases ([place]) 05/04/2018  . Cerebral edema ([facility])   . Altered mental status 04/25/2018  . Malnutrition of moderate degree 04/17/2018  . Breast mass, probable cancer 04/15/2018  . Scalp mass, probable breast cancer metastasis 04/15/2018  . Stercoral proctocolitis of rectum 04/15/2018  . Metastatic breast cancer ([place]) 04/14/2018  . FTT (failure to thrive) in adult 04/14/2018  . Anemia 04/14/2018  . Murmur, cardiac 04/14/2018  . Diarrhea   . Fecal impaction in rectum ([place])   . Hypokalemia   . Cachexia ([place]) 04/13/2018  . Hematochezia 04/13/2018  . Community acquired pneumonia 04/13/2018  . Constipation, chronic 04/13/2018  . 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 Cough 11/01/2010  . HYPERGLYCEMIA 10/23/2009  . PAP SMEAR, ABNORMAL 02/10/2007  . HEMOCCULT POSITIVE STOOL 02/06/2007  . Essential hypertension 10/23/2006  . ALLERGIC RHINITIS 10/23/2006     Palliative Care Assessment & Plan   Patient Profile: 78 y.o. female  with past medical history of R breast cancer (mets to lung, bone, liver, brain diagnosed January 2020; follows with Dr. Jana Hakim), HTN admitted on 05/27/2018 with abd pain. Found to have acute pancreatitis, febrile neutropenia, thrombocytopenia with chronic failure to thrive.  Now full comfort care.  Recommendations/Plan:  Actively dying.  Appears comfortable.    Medications reviewed.  Continue current regimen.  Prognosis likely hours to possibly days.  She is not stable enough to consider transfer from the hospital and will have hospital death.  Goals of Care and Additional Recommendations:  Limitations on Scope of Treatment: Full Comfort Care  Code Status:    Code Status Orders  (From admission, onward)         Start     Ordered   05/27/18 1555  Do not attempt resuscitation (DNR)  Continuous    Question Answer Comment  In the event of cardiac or respiratory ARREST Do not call a "code blue"   In the event of cardiac or respiratory ARREST Do not perform Intubation, CPR, defibrillation or ACLS   In the event of cardiac or respiratory ARREST Use medication by any route, position, wound care, and other measures to relive pain and suffering. May use oxygen, suction and manual treatment of airway obstruction as needed for comfort.      05/27/18 1556        Code Status History    Date Active Date Inactive Code Status Order ID Comments User Context   05/22/2018 2025 05/27/2018 1556 DNR [id number]  Etta Quill, DO ED   04/25/2018 1634 04/30/2018 1804 Full Code [id number]  Reubin Milan, MD ED   04/13/2018 2130 04/20/2018 2232 Full Code [id number]  Wouk, Ailene Rud, MD ED       Prognosis:   Hours - Days  Discharge Planning:  Anticipated Hospital Death  Care plan was discussed with RN, Dr. Tana Coast  Thank you for allowing the Palliative Medicine Team to assist in the care of this  patient.   Total  Time 20 Prolonged Time Billed No      Greater than 50%  of this time was spent counseling and coordinating care related to the above assessment and plan.  Micheline Rough, MD  Please contact Palliative Medicine Team phone at [phone] for questions and concerns.

## 2018-05-31 NOTE — Progress Notes (Signed)
[facility]                                                                              Patient Demographics  Morgan Morrow, is a 78 y.o. female, DOB - 09-22-1940, LTJ:[id number]  Admit date - 05/07/2018   Admitting Physician Etta Quill, DO  Outpatient Primary MD for the patient is Leighton Ruff, MD  Outpatient specialists:   LOS - 5  days   Medical records reviewed and are as summarized below:    Chief Complaint  Patient presents with  . Abdominal Pain       Brief summary   Morgan Morrow a 78 y.o.femalewith medical history significant ofHTN, metastatic BRCAdiagnosis just last month with mets to lung, brain, bone, scalp, liver. Patient was started on Ibrance20days ago as well as decadron and anastrozole.On admission, she had abrupt onset of severe abdpain and fullness. No vomiting nor diarrhea. No urinary symptoms. Associated generalized weakness and nausea. ED Course:T 93.5, Severe acute pancreatitis on CT, WBC 0.5, platelets 43, lipase 3461, AST and ALT in the 600s, AKI BUN 61. Put on cefepime and flagyl. Hospitalistasked to admit.   Assessment & Plan   Hypothermia with neutropenia -Patient presented from home with progressive decline, acute onset abdominal pain.  She was recently diagnosed with metastatic breast CA and started on chemotherapy with Ibrance, anastrozole and Decadron.  -  Patient was started on broad-spectrum antibiotics with cefepime/Flagyl and stress dose steroids.  Patient was started on Granix.   --Patient has been transitioned to comfort measures, anticipating hospital death, palliative care following -Actively dying with shallow breathing, unresponsive  Severe acute pancreatitis -Patient presented with pancreatitis, lipase of 3461, transaminitis. - CT abdomen/pelvis notable for severe pancreatitis with gaseous to dilation of large bowel consistent with possible ileus; no contrast was utilized during the CT so  unable to determine if necrotizing infection.  Gallbladder visualized without wall thickening, no biliary duct dilation, no distention, but noted cholelithiasis.   -Possibly could have been due to chemotherapy, now transitioned to comfort measures, anticipating hospital death  Thrombocytopenia -DIC panel showed elevated d-dimer of 6.32, fibrinogen 1.8, no schistocytes.  Platelets 28, INR 1.0 -Comfort measures  Acute urinary retention Foley catheter was placed on 2/23, now transitioned to comfort measures  Right breast cancer with metastasis -Patient had followed Dr. Jana Hakim, per family has been declining for several years but refused further medical work-up until recently. - CT chest/abdomen/pelvis Jan 2020 notable for extensive osteolytic abnormalities, liver lesions, and multiple pulmonary nodules bilaterally.  Underwent biopsy of right breast on 04/16/2018 that was remarkable for estrogen and progesterone receptor strongly positive, H ER-2 nonamplified.  MR brain with/without contrast 04/26/2018 notable for multiple calvarial metastasis, dural thickening compatible with dural invasion.  Started on anastrozole, Ibrance, and Decadron.  Was due to start denosumab/xgeva on 05/28/2018. 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, lung, liver and scalp, BMI 15.6 -Transition to comfort measures,   Code Status: DNR DVT Prophylaxis:   Family Communication:  No family members at the bedside   Disposition Plan: Anticipating hospital death, prognosis hours, shallow breathing, unresponsive with apneic periods  Time Spent in minutes 15 minutes  Procedures:    Consultants:   Palliative care  Antimicrobials:   Anti-infectives (From admission, onward)   Start     Dose/Rate Route Frequency Ordered Stop   05/27/18 0600   ceFEPIme (MAXIPIME) 1 g in sodium chloride 0.9 % 100 mL IVPB  Status:  Discontinued     1 g 200 mL/hr over 30 Minutes Intravenous Every 12 hours 05/18/2018 2033 05/27/18 1556   05/30/2018 1815  ceFEPIme (MAXIPIME) 2 g in sodium chloride 0.9 % 100 mL IVPB     2 g 200 mL/hr over 30 Minutes Intravenous  Once 05/28/2018 1800 05/12/2018 1848   05/07/2018 1815  metroNIDAZOLE (FLAGYL) IVPB 500 mg  Status:  Discontinued     500 mg 100 mL/hr over 60 Minutes Intravenous Every 8 hours 05/24/2018 1800 05/27/18 1556   05/12/2018 1815  vancomycin (VANCOCIN) IVPB 1000 mg/200 mL premix     1,000 mg 200 mL/hr over 60 Minutes Intravenous  Once 05/11/2018 1800 05/27/18 0700         Medications  Scheduled Meds: . glycopyrrolate  0.4 mg Intravenous Q4H  . LORazepam  0.5 mg Intravenous Q6H   Continuous Infusions: . HYDROmorphone 0.5 mg/hr (05/30/18 1517)   PRN Meds:.acetaminophen **OR** acetaminophen, antiseptic oral rinse, haloperidol **OR** haloperidol **OR** haloperidol lactate, HYDROmorphone, HYDROmorphone (DILAUDID) injection, LORazepam, ondansetron **OR** ondansetron (ZOFRAN) IV, polyvinyl alcohol      Subjective:   Morgan Morrow was seen and examined today.  Appears to be actively dying, shallow breathing with apneic periods.  Unresponsive  Objective:   Vitals:   05/30/18 0118 05/30/18 0546 05/30/18 0559 05/31/18 0300  BP:  (!) 101/59    Pulse:  91    Resp: (!) 5 (!) 7 (!) 6 12  Temp:      TempSrc:      SpO2:  (!) 72%    Weight:      Height:        Intake/Output Summary (Last 24 hours) at 05/31/2018 1347 Last data filed at 05/31/2018 0600 Gross per 24 hour  Intake 6.61 ml  Output 70 ml  Net -63.39 ml     Wt Readings from Last 3 Encounters:  05/25/2018 36.3 kg  05/04/18 36.6 kg  04/25/18 41.7 kg   Physical Exam  General: Unresponsive, shallow breathing, apneic episode, appears comfortable  Eyes:  HEENT:    Cardiovascular: S1 S2 clear,  RRR. No pedal edema b/l  Respiratory:  Scattered rhonchi with diminished air movement  Gastrointestinal: Soft, nontender, nondistended  Ext: Cachectic  Neuro: no new deficits  Musculoskeletal: No cyanosis, clubbing  Skin: No rashes  Psych: Unresponsive    Data Reviewed:  I have personally reviewed following labs and imaging studies  Micro Results Recent Results (from the past 240 hour(s))  Blood culture (routine x 2)     Status: None   Collection Time: 05/17/2018  4:16 PM  Result Value Ref Range Status   Specimen Description   Final    BLOOD RIGHT ANTECUBITAL Performed at [facility] [address] [place], [place] [address]    Special Requests   Final    BOTTLES DRAWN AEROBIC ONLY Blood Culture adequate volume Performed at [facility] [address], [place], [place] [address]    Culture   Final    NO GROWTH 5 DAYS Performed  at [facility], [place] [address] [place], [place] [address]    Report Status 05/31/2018 FINAL  Final  Blood culture (routine x 2)     Status: None   Collection Time: 05/29/2018  9:54 PM  Result Value Ref Range Status   Specimen Description   Final    BLOOD RIGHT ANTECUBITAL Performed at [facility] [address] [place], [place] [address]    Special Requests   Final    BOTTLES DRAWN AEROBIC ONLY Blood Culture adequate volume Performed at [facility] [address], [place], [place] [address]    Culture   Final    NO GROWTH 5 DAYS Performed at [facility], [place] [address], [place], [place] [address]    Report Status 05/31/2018 FINAL  Final  MRSA PCR Screening     Status: None   Collection Time: 05/25/2018 10:08 PM  Result Value Ref Range Status   MRSA by PCR NEGATIVE NEGATIVE Final    Comment:        The GeneXpert MRSA Assay (FDA approved for NASAL specimens only), is one component of a comprehensive MRSA colonization surveillance program. It is not intended to diagnose  MRSA infection nor to guide or monitor treatment for MRSA infections. Performed at [facility], [place] [address], [place], [place] [address]     Radiology Reports Ct Abdomen Pelvis Wo Contrast  Result Date: 05/27/2018 CLINICAL DATA:  Metastatic breast cancer. Generalized weakness and generalized abdominal pain. EXAM: CT ABDOMEN AND PELVIS WITHOUT CONTRAST TECHNIQUE: Multidetector CT imaging of the abdomen and pelvis was performed following the standard protocol without IV contrast. COMPARISON:  04/13/2018 CT abdomen/pelvis. FINDINGS: Lower chest: Previously visualized extensive patchy nodularity at both lung bases on 04/13/2018 CT abdomen study has decreased. For example, a 5 mm medial basilar right lower lobe nodule (series 4/image 23), decreased from 7 mm. Basilar left lower lobe 7 mm nodule (series 4/image 18), decreased from 10 mm. Peripheral right lower lobe 7 mm nodule (series 4/image 9), decreased from 10 mm. Coronary atherosclerosis. Hepatobiliary: Normal liver size. Previously visualized hypodense liver masses are less conspicuous on today's noncontrast CT, however appear stable to mildly decreased. For example, a superior right liver 0.9 cm lesion (series 2/image 9), slightly decreased from 1.1 cm. Anterior inferior right liver 1.8 cm lesion (series 2/image 20), stable. Posterior inferior right liver 1.0 cm lesion (series 2/image 17), slightly decreased from 1.2 cm. No appreciable new liver lesions. Cholelithiasis. No gallbladder distention. No gallbladder wall thickening. No biliary ductal dilatation. Pancreas: New diffuse thickening of the pancreas with diffuse peripancreatic fat stranding and peripancreatic fluid, compatible with acute pancreatitis. Inflammatory fluid extends into the bilateral anterior paranephric retroperitoneal spaces. No pancreatic parenchymal gas. Spleen: Normal size. No mass. Adrenals/Urinary Tract: Normal adrenals. No hydronephrosis. No renal stones.  Isodense subcentimeter renal cortical lesion in the lateral upper left kidney is too small to characterize and is unchanged. No new contour deforming renal lesions. Normal nondistended bladder. Stomach/Bowel: Normal non-distended stomach. Normal caliber small bowel with no small bowel wall thickening. Appendix not discretely visualized. Mild sigmoid diverticulosis. Moderate gaseous dilatation of the large bowel, most prominent in the cecum and rectum. No definite large bowel wall thickening. Vascular/Lymphatic: Atherosclerotic nonaneurysmal abdominal aorta. No pathologically enlarged lymph nodes in the abdomen or pelvis. Reproductive: Grossly normal uterus.  No adnexal mass. Other: No free air. Small volume ascites. No focal fluid collection. Musculoskeletal: Widespread patchy sclerotic osseous metastatic disease throughout the  visualized axial and proximal appendicular skeleton, not appreciably changed. Chronic moderate L1 vertebral compression fracture. IMPRESSION: 1. Acute pancreatitis, which appears severe on this noncontrast scan, which precludes assessment for pancreatic necrosis. IV contrast was not administered due to acute renal failure. 2. Small volume ascites. 3. Moderate gaseous dilatation of the large bowel compatible with adynamic ileus. No bowel obstruction. 4. Widespread pulmonary nodularity at the lung bases, improved from 04/13/2018 CT. 5. Liver masses are stable to mildly decreased. 6. Widespread patchy sclerotic osseous metastatic disease throughout the visualized skeleton, unchanged. 7. Cholelithiasis. 8.  Aortic Atherosclerosis (ICD10-I70.0). Electronically Signed   By: Ilona Sorrel M.D.   On: 05/30/2018 19:27   Dg Abdomen Acute W/chest  Result Date: 05/10/2018 CLINICAL DATA:  Abdominal pain EXAM: DG ABDOMEN ACUTE W/ 1V CHEST COMPARISON:  CT 04/14/2018 FINDINGS: Heart is normal size. No confluent airspace opacities or effusions. Old right lateral 8th rib fracture, stable. Mild gaseous  distention of the colon may reflect mild ileus. No evidence for bowel obstruction. No organomegaly or free air. Scoliosis and degenerative changes in the lumbar spine. IMPRESSION: Gaseous distention of the colon diffusely suggests ileus. No evidence of bowel obstruction or free air. No acute cardiopulmonary disease. Electronically Signed   By: Rolm Baptise M.D.   On: 05/12/2018 18:59    Lab Data:  CBC: Recent Labs  Lab 05/23/2018 1609 05/24/2018 2154 05/27/18 0349  WBC 0.5*  --  0.6*  NEUTROABS 0.4*  --   --   HGB 11.7*  --  9.6*  HCT 36.3  --  31.1*  MCV 94.5  --  98.1  PLT 43* 33* 28*   Basic Metabolic Panel: Recent Labs  Lab 05/18/2018 1609 05/27/18 0349  NA 134* 137  K 4.3 4.2  CL 100 110  CO2 24 22  GLUCOSE 95 85  BUN 61* 62*  CREATININE 1.27* 1.33*  CALCIUM 8.9 8.1*   GFR: Estimated Creatinine Clearance: 19.7 mL/min (A) (by C-G formula based on SCr of 1.33 mg/dL (H)). Liver Function Tests: Recent Labs  Lab 05/27/2018 1609 05/27/18 0349  AST 684* 482*  ALT 665* 527*  ALKPHOS 255* 204*  BILITOT 1.3* 1.2  PROT 5.7* 4.4*  ALBUMIN 3.0* 2.3*   Recent Labs  Lab 05/12/2018 1609  LIPASE 3,461*   No results for input(s): AMMONIA in the last 168 hours. Coagulation Profile: Recent Labs  Lab 05/27/2018 2154  INR 1.03   Cardiac Enzymes: No results for input(s): CKTOTAL, CKMB, CKMBINDEX, TROPONINI in the last 168 hours. BNP (last 3 results) No results for input(s): PROBNP in the last 8760 hours. HbA1C: No results for input(s): HGBA1C in the last 72 hours. CBG: No results for input(s): GLUCAP in the last 168 hours. Lipid Profile: No results for input(s): CHOL, HDL, LDLCALC, TRIG, CHOLHDL, LDLDIRECT in the last 72 hours. Thyroid Function Tests: No results for input(s): TSH, T4TOTAL, FREET4, T3FREE, THYROIDAB in the last 72 hours. Anemia Panel: No results for input(s): VITAMINB12, FOLATE, FERRITIN, TIBC, IRON, RETICCTPCT in the last 72 hours. Urine analysis:      Component Value Date/Time   COLORURINE STRAW (A) 04/25/2018 1655   APPEARANCEUR HAZY (A) 04/25/2018 1655   LABSPEC 1.009 04/25/2018 1655   PHURINE 8.0 04/25/2018 1655   GLUCOSEU NEGATIVE 04/25/2018 1655   HGBUR NEGATIVE 04/25/2018 1655   BILIRUBINUR NEGATIVE 04/25/2018 1655   BILIRUBINUR neg 01/02/2014 1034   KETONESUR 5 (A) 04/25/2018 1655   PROTEINUR NEGATIVE 04/25/2018 1655   UROBILINOGEN 0.2 01/02/2014 1034   NITRITE  NEGATIVE 04/25/2018 1655   LEUKOCYTESUR NEGATIVE 04/25/2018 1655     Ripudeep Rai M.D. [facility] 05/31/2018, 1:47 PM  Pager: [phone] Between 7am to 7pm - call Pager - [phone]  After 7pm go to [email or web address] - password [email or web address]  Call night coverage person covering after 7pm

## 2018-06-01 ENCOUNTER — Encounter: Payer: Self-pay | Admitting: Oncology

## 2018-06-03 NOTE — Progress Notes (Signed)
12m hydromorphone drip wasted in stericycle.

## 2018-06-03 NOTE — Death Summary Note (Signed)
DEATH SUMMARY   Patient Details  Name: Morgan Morrow MRN: [id number] DOB: Jan 09, 1941  Admission/Discharge Information   Admit Date:  Jun 16, 2018  Date of Death: Date of Death: 06-22-18  Time of Death: Time of Death: 0900  Length of Stay: May 31, 2022  Referring Physician: Leighton Ruff, MD   Reason(s) for Hospitalization   Morgan Morrow was a 78 y.o.femalewith medical history significant ofHTN, metastatic BRCAdiagnosis just last month with mets to lung, brain, bone, scalp, liver. Patient was started on Ibrance20days ago as well as decadron and anastrozole. On admission, she had abrupt onset of severe abdpain and fullness. No vomiting nor diarrhea. No urinary symptoms. Associated generalized weakness and nausea. ED Course:T 93.5, Severe acute pancreatitis on CT, WBC 0.5, platelets 43, lipase 3461, AST and ALT in the 600s, AKI BUN 61.  Patient was placed on cefepime and Flagyl and admitted for further work-up.     Diagnoses   Preliminary cause of death: Severe acute pancreatitis with hypothermia and neutropenia Secondary Diagnoses (including complications and co-morbidities):     Febrile neutropenia (HCC)   Essential hypertension   Cachexia (HCC)   Pancytopenia with thrombocytopenia, neutropenia  Right breast cancer with diffuse metastasis  Failure to thrive with severe protein calorie malnutrition      Transaminitis    Brief Hospital Course (including significant findings, care, treatment, and services provided and events leading to death)   Hypothermia with neutropenia -Patient had presented from home with progressive decline and acute onset abdominal pain.  She was recently diagnosed with metastatic breast CA to lungs, brain, bone, scalp, liver.  She was started on chemotherapy with Ibrance, anastrozole and Decadron.  -Patient was found to have severe acute pancreatitis on CT abdomen, neutropenia, pancytopenia, transaminitis.   -She was started on broad-spectrum  antibiotics with cefepime/Flagyl and stress dose steroids.  Patient was started on Granix with recommendations from oncology, Dr. Alen Blew --Patient has been transitioned to comfort measures, anticipating hospital death, palliative care following -No significant improvement, goals of care were addressed, palliative care consult was obtained.  Family requested for comfort measures.  Severe acute pancreatitis -Patient presented with severe acute pancreatitis on CT, lipase of 3461, transaminitis, pancytopenia. -CT abdomen/pelvis notable for severe pancreatitis with gaseous to dilation of large bowel consistent with possible ileus; no contrast was utilized during the CT so unable to determine if necrotizing infection. Gallbladder visualized without wall thickening, no biliary duct dilation, no distention, but noted cholelithiasis.  She was followed closely by gastroenterology, felt likely due to passage of a gallstone or from chemotherapy or malignancy.  GI, Dr. Michail Sermon recommended supportive care and palliative consult -Patient was transitioned to comfort measures and follow-up was followed closely by palliative medicine  Thrombocytopenia -DIC panel showed elevated d-dimer of 6.32, fibrinogen 1.8, no schistocytes.  Platelets 28, INR 1.0.  Patient was placed on comfort measures  Acute urinary retention Foley catheter was placed on 2/23, transitioned to comfort measures  Right breast cancer with metastasis -Patient had followed Dr. Jana Hakim, per family has been declining for several years but refused further medical work-up until recently. - CT chest/abdomen/pelvis Jan 2020 notable for extensive osteolytic abnormalities, liver lesions, and multiple pulmonary nodules bilaterally. Underwent biopsy of right breast on 04/16/2018 that was remarkable for estrogen and progesterone receptor strongly positive, H ER-2 nonamplified. MR brain with/without contrast 04/26/2018 notable for multiple calvarial  metastasis, dural thickening compatible with dural invasion. Started on anastrozole, Ibrance, and Decadron. Was due to start denosumab/xgeva on 05/28/2018. -Patient had poor prognosis, transitioned  to comfort care  Adult failure to thrive with severe protein calorie malnutrition  -In the setting of stage IV breast cancer with metastasis to bone, lung, liver and scalp, BMI 15.6  Patient passed on 2018-07-01 at 2022/06/12 AM.  Family was informed.    Pertinent Labs and Studies  Significant Diagnostic Studies Ct Abdomen Pelvis Wo Contrast  Result Date: 05/29/2018 CLINICAL DATA:  Metastatic breast cancer. Generalized weakness and generalized abdominal pain. EXAM: CT ABDOMEN AND PELVIS WITHOUT CONTRAST TECHNIQUE: Multidetector CT imaging of the abdomen and pelvis was performed following the standard protocol without IV contrast. COMPARISON:  04/13/2018 CT abdomen/pelvis. FINDINGS: Lower chest: Previously visualized extensive patchy nodularity at both lung bases on 04/13/2018 CT abdomen study has decreased. For example, a 5 mm medial basilar right lower lobe nodule (series 4/image 23), decreased from 7 mm. Basilar left lower lobe 7 mm nodule (series 4/image 18), decreased from 10 mm. Peripheral right lower lobe 7 mm nodule (series 4/image 9), decreased from 10 mm. Coronary atherosclerosis. Hepatobiliary: Normal liver size. Previously visualized hypodense liver masses are less conspicuous on today's noncontrast CT, however appear stable to mildly decreased. For example, a superior right liver 0.9 cm lesion (series 2/image 9), slightly decreased from 1.1 cm. Anterior inferior right liver 1.8 cm lesion (series 2/image 20), stable. Posterior inferior right liver 1.0 cm lesion (series 2/image 17), slightly decreased from 1.2 cm. No appreciable new liver lesions. Cholelithiasis. No gallbladder distention. No gallbladder wall thickening. No biliary ductal dilatation. Pancreas: New diffuse thickening of the pancreas with  diffuse peripancreatic fat stranding and peripancreatic fluid, compatible with acute pancreatitis. Inflammatory fluid extends into the bilateral anterior paranephric retroperitoneal spaces. No pancreatic parenchymal gas. Spleen: Normal size. No mass. Adrenals/Urinary Tract: Normal adrenals. No hydronephrosis. No renal stones. Isodense subcentimeter renal cortical lesion in the lateral upper left kidney is too small to characterize and is unchanged. No new contour deforming renal lesions. Normal nondistended bladder. Stomach/Bowel: Normal non-distended stomach. Normal caliber small bowel with no small bowel wall thickening. Appendix not discretely visualized. Mild sigmoid diverticulosis. Moderate gaseous dilatation of the large bowel, most prominent in the cecum and rectum. No definite large bowel wall thickening. Vascular/Lymphatic: Atherosclerotic nonaneurysmal abdominal aorta. No pathologically enlarged lymph nodes in the abdomen or pelvis. Reproductive: Grossly normal uterus.  No adnexal mass. Other: No free air. Small volume ascites. No focal fluid collection. Musculoskeletal: Widespread patchy sclerotic osseous metastatic disease throughout the visualized axial and proximal appendicular skeleton, not appreciably changed. Chronic moderate L1 vertebral compression fracture. IMPRESSION: 1. Acute pancreatitis, which appears severe on this noncontrast scan, which precludes assessment for pancreatic necrosis. IV contrast was not administered due to acute renal failure. 2. Small volume ascites. 3. Moderate gaseous dilatation of the large bowel compatible with adynamic ileus. No bowel obstruction. 4. Widespread pulmonary nodularity at the lung bases, improved from 04/13/2018 CT. 5. Liver masses are stable to mildly decreased. 6. Widespread patchy sclerotic osseous metastatic disease throughout the visualized skeleton, unchanged. 7. Cholelithiasis. 8.  Aortic Atherosclerosis (ICD10-I70.0). Electronically Signed   By:  Ilona Sorrel M.D.   On: 05/31/2018 19:27   Dg Abdomen Acute W/chest  Result Date: 05/06/2018 CLINICAL DATA:  Abdominal pain EXAM: DG ABDOMEN ACUTE W/ 1V CHEST COMPARISON:  CT 04/14/2018 FINDINGS: Heart is normal size. No confluent airspace opacities or effusions. Old right lateral 8th rib fracture, stable. Mild gaseous distention of the colon may reflect mild ileus. No evidence for bowel obstruction. No organomegaly or free air. Scoliosis and degenerative changes in the  lumbar spine. IMPRESSION: Gaseous distention of the colon diffusely suggests ileus. No evidence of bowel obstruction or free air. No acute cardiopulmonary disease. Electronically Signed   By: Rolm Baptise M.D.   On: 05/18/2018 18:59    Microbiology Recent Results (from the past 240 hour(s))  Blood culture (routine x 2)     Status: None   Collection Time: 05/31/2018  4:16 PM  Result Value Ref Range Status   Specimen Description   Final    BLOOD RIGHT ANTECUBITAL Performed at [facility] [address] [place], [place] [address]    Special Requests   Final    BOTTLES DRAWN AEROBIC ONLY Blood Culture adequate volume Performed at [facility] [address] [place], [place] [address]    Culture   Final    NO GROWTH 5 DAYS Performed at [facility], [place] [address] [place], [place] [address]    Report Status 05/31/2018 FINAL  Final  Blood culture (routine x 2)     Status: None   Collection Time: 05/10/2018  9:54 PM  Result Value Ref Range Status   Specimen Description   Final    BLOOD RIGHT ANTECUBITAL Performed at [facility] [address] [place], [place] [address]    Special Requests   Final    BOTTLES DRAWN AEROBIC ONLY Blood Culture adequate volume Performed at [facility] [address] [place], [place] [address]    Culture   Final    NO GROWTH 5 DAYS Performed at [facility], [place] [address], [place], [place]  [address]    Report Status 05/31/2018 FINAL  Final  MRSA PCR Screening     Status: None   Collection Time: 05/12/2018 10:08 PM  Result Value Ref Range Status   MRSA by PCR NEGATIVE NEGATIVE Final    Comment:        The GeneXpert MRSA Assay (FDA approved for NASAL specimens only), is one component of a comprehensive MRSA colonization surveillance program. It is not intended to diagnose MRSA infection nor to guide or monitor treatment for MRSA infections. Performed at [facility], [place] [address] [place], [place] [address]     Lab Basic Metabolic Panel: Recent Labs  Lab 05/31/2018 1609 05/27/18 0349  NA 134* 137  K 4.3 4.2  CL 100 110  CO2 24 22  GLUCOSE 95 85  BUN 61* 62*  CREATININE 1.27* 1.33*  CALCIUM 8.9 8.1*   Liver Function Tests: Recent Labs  Lab 05/06/2018 1609 05/27/18 0349  AST 684* 482*  ALT 665* 527*  ALKPHOS 255* 204*  BILITOT 1.3* 1.2  PROT 5.7* 4.4*  ALBUMIN 3.0* 2.3*   Recent Labs  Lab 05/20/2018 1609  LIPASE 3,461*   No results for input(s): AMMONIA in the last 168 hours. CBC: Recent Labs  Lab 05/10/2018 1609 05/07/2018 2154 05/27/18 0349  WBC 0.5*  --  0.6*  NEUTROABS 0.4*  --   --   HGB 11.7*  --  9.6*  HCT 36.3  --  31.1*  MCV 94.5  --  98.1  PLT 43* 33* 28*   Cardiac Enzymes: No results for input(s): CKTOTAL, CKMB, CKMBINDEX, TROPONINI in the last 168 hours. Sepsis Labs: Recent Labs  Lab 05/25/2018 1609 05/11/2018 2154 05/27/18 0349  WBC 0.5*  --  0.6*  LATICACIDVEN 1.7 1.1  --     Procedures/Operations     Ripudeep Rai June 06, 2018, 11:18 AM

## 2018-06-03 DEATH — deceased

## 2020-07-25 IMAGING — CT CT CHEST W/ CM
2 of 4 series · 15 of 36 positions shown, 18 images · IV contrast (omnipaque)
Comparison: Chest x-ray 04/13/2018

CLINICAL DATA: Right middle lobe pneumonia.

EXAM:
CT CHEST WITH CONTRAST
TECHNIQUE: Multidetector CT imaging of the chest was performed during
intravenous contrast administration.
CONTRAST:  75mL OMNIPAQUE IOHEXOL 300 MG/ML  SOLN

[Series 3: axial st · axial · 0.64mm/px · z∈[-804,-514]mm · 12 of 171 slices shown, 15 images]
[im 13/171  mediastinal]
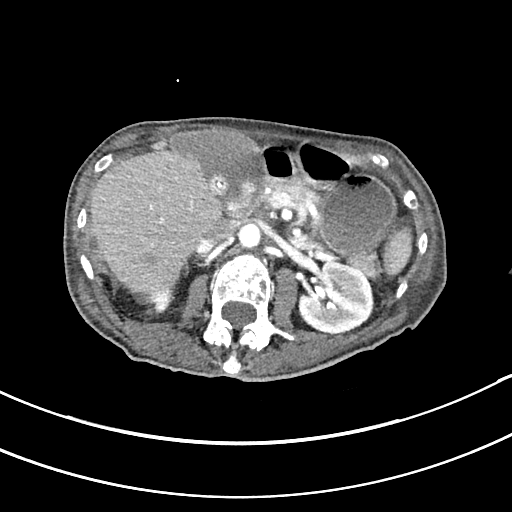
[im 13/171  lung]
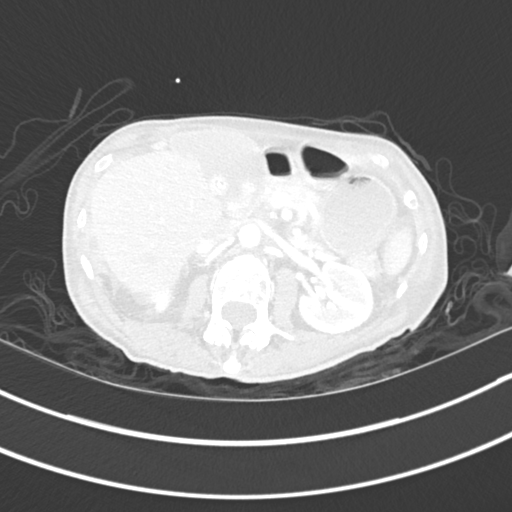
[im 25/171  lung]
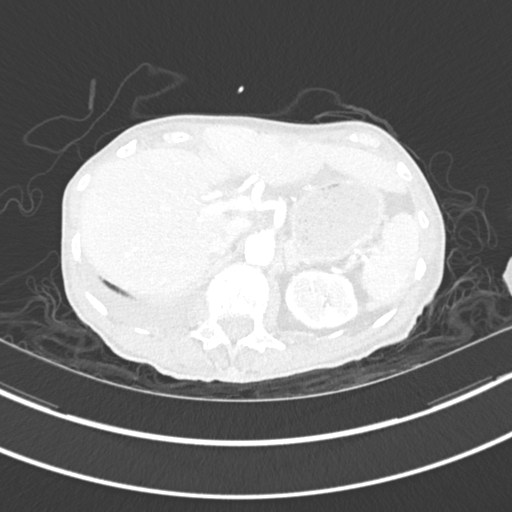
[im 37/171  lung]
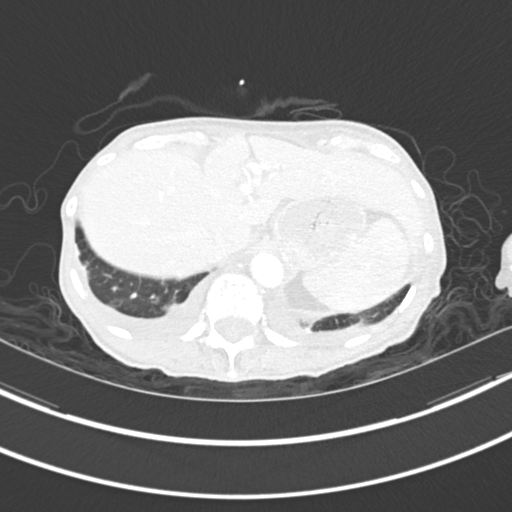
[im 49/171  lung]
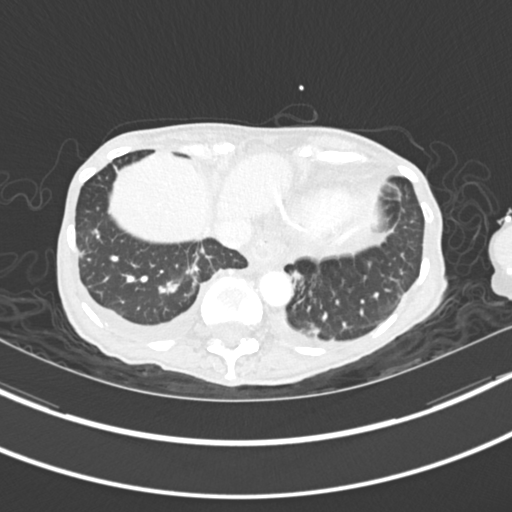
[im 61/171  mediastinal]
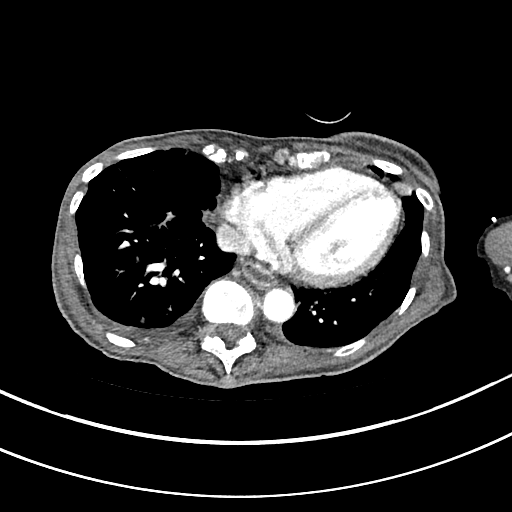
[im 61/171  lung]
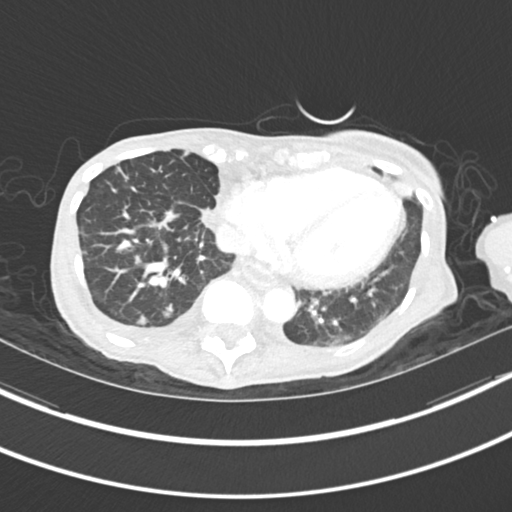
[im 73/171  lung]
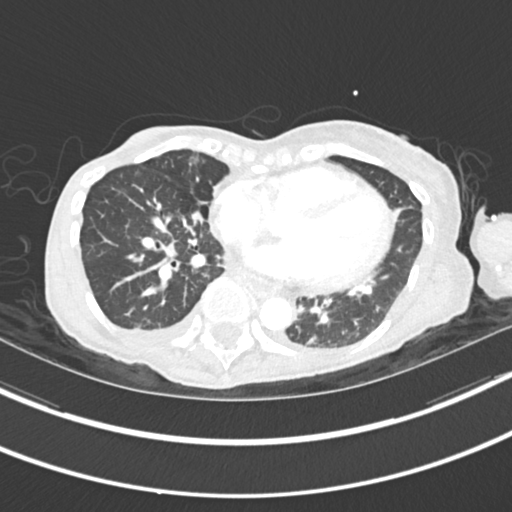
[im 98/171  lung]
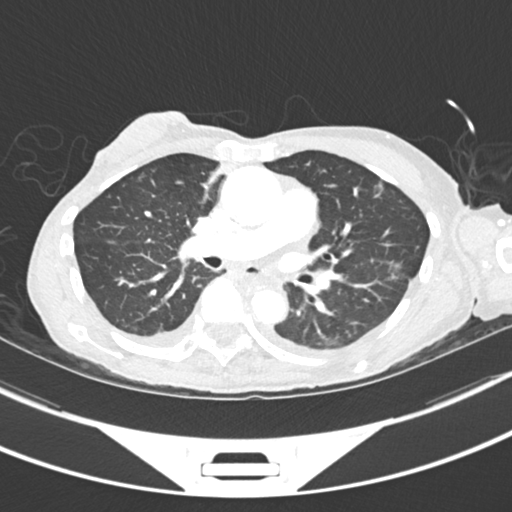
[im 110/171  lung]
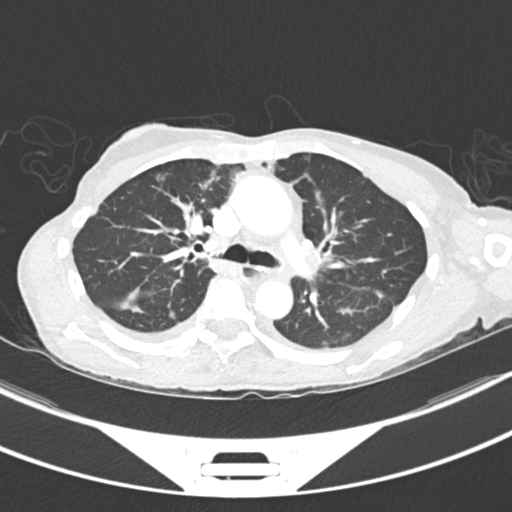
[im 122/171  mediastinal]
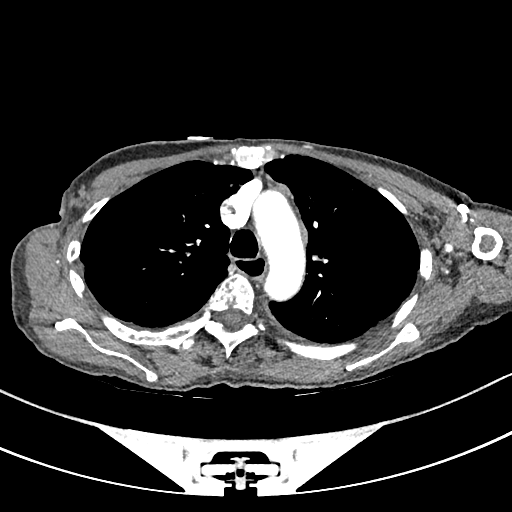
[im 122/171  lung]
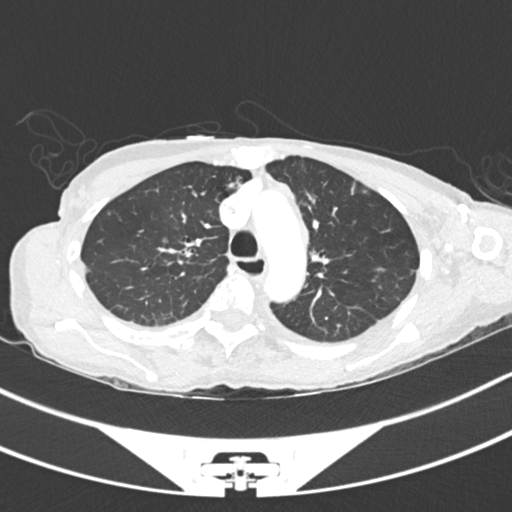
[im 134/171  lung]
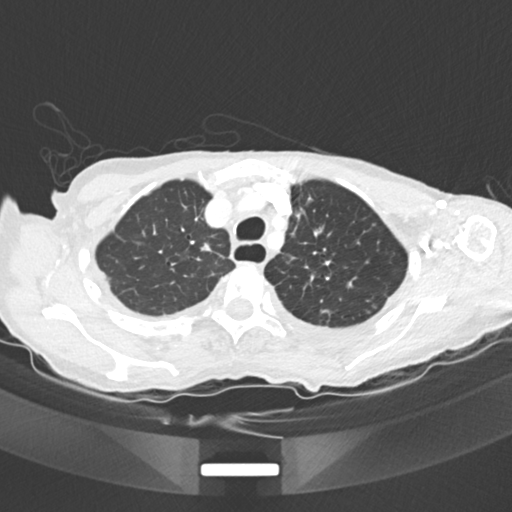
[im 146/171  lung]
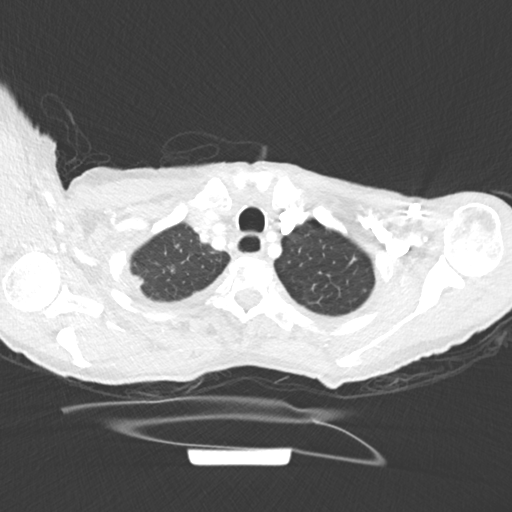
[im 158/171  lung]
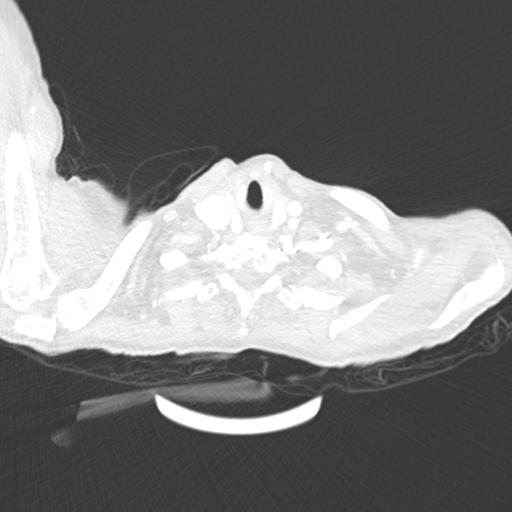

[Series 10: coronal · coronal · 0.63mm/px · 3 of 98 slices shown]
[im 20/98  lung]
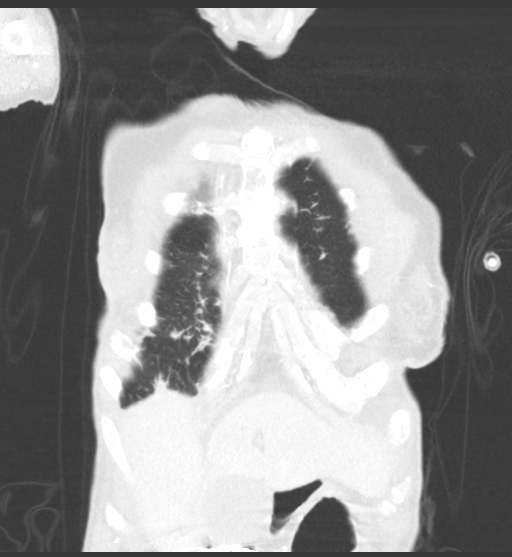
[im 39/98  lung]
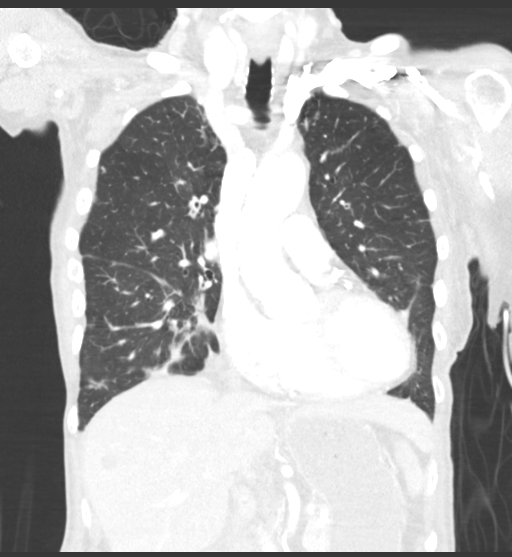
[im 59/98  lung]
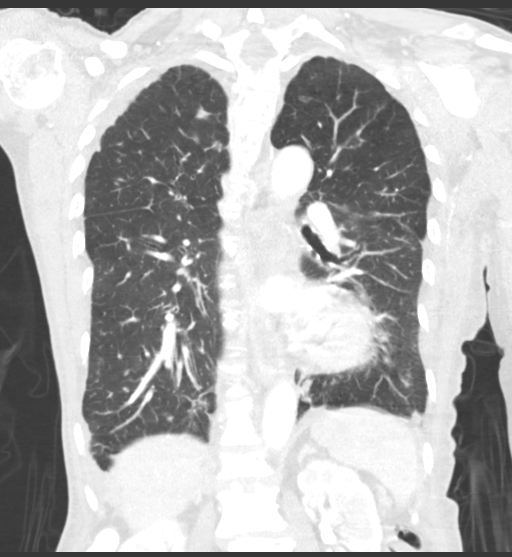

[15 of 36 positions shown; findings below may reference images not displayed]

FINDINGS: Cardiovascular: Heart size upper normal. No substantial pericardial
effusion. Coronary artery calcification is evident. Atherosclerotic
calcification is noted in the wall of the thoracic aorta. Ascending
thoracic aorta measures 3.8 cm diameter.

Mediastinum/Nodes: Soft tissue attenuation is identified in the AP
window without a discrete lymph node. Small prevascular lymph nodes
are so seated with small lymph nodes in each hilar region.
Circumferential wall thickening is noted in the distal esophagus and
fluid within the esophageal lumen is compatible with reflux or
dysmotility. There is no axillary lymphadenopathy.

Lungs/Pleura: The central tracheobronchial airways are patent.
Numerous ill-defined pulmonary nodules are scattered through both
lungs. Index nodule medial right upper lobe (45/11) measures 5 mm.
Index right lower lobe nodule seen posteriorly on 102/11 measures 8
mm. Representative peripheral left lower lobe nodule (83/11)
measures 6 mm. Small bilateral pleural effusions evident.

Upper Abdomen: 11 mm low-density lesion in the liver, better
characterized on yesterday's abdomen CT. Additional small
low-density lesions in the liver. Calcified gallstones evident.
Small cyst noted right kidney.

Musculoskeletal: Widespread bony metastatic involvement is evident.
7.7 x 6.8 x 2.0 cm mass identified in the right breast.
IMPRESSION: 1. No evidence for right middle lobe pneumonia. Opacity seen over
the right breast on recent chest x-ray likely represents the
patient's known large right breast mass.
2. Numerous ill-defined pulmonary nodules scattered through both
lungs, highly suspicious for metastatic disease.
3. Widespread bony metastatic involvement.
4. Hypodense liver lesions concerning for metastatic involvement.
5. Small bilateral pleural effusions.
6.  Aortic Atherosclerois ([id number])

## 2020-07-25 IMAGING — CT CT HEAD WO/W CM
3 of 6 series · 15 of 47 positions shown, 18 images · IV contrast (omnipaque)
Comparison: None.

CLINICAL DATA: Progressive weakness and weight loss for several
months. Diarrhea. Liver and bone metastases. Unknown primary.
Bilateral pulmonary nodules.

EXAM:
CT HEAD WITHOUT AND WITH CONTRAST
TECHNIQUE: Contiguous axial images were obtained from the base of the skull
through the vertex without and with intravenous contrast
CONTRAST:  75mL OMNIPAQUE IOHEXOL 300 MG/ML  SOLN

[Series 6: head wo · axial · 0.46mm/px · z∈[-398,-268]mm · 10 of 32 slices shown, 13 images]
[im 3/32  brain]
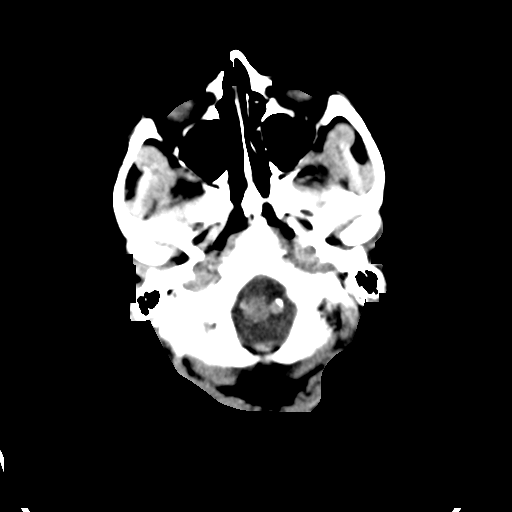
[im 3/32  bone]
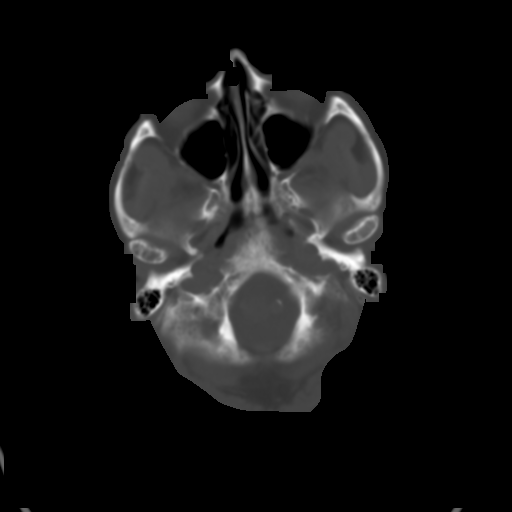
[im 5/32  brain]
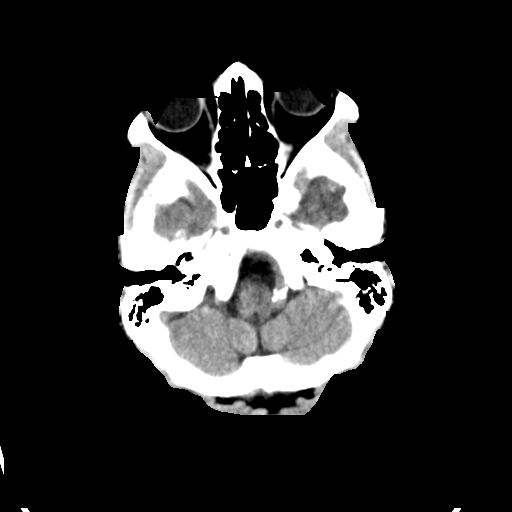
[im 9/32  brain]
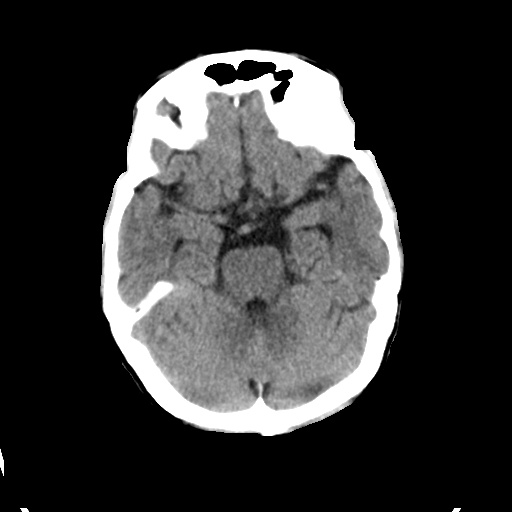
[im 12/32  brain]
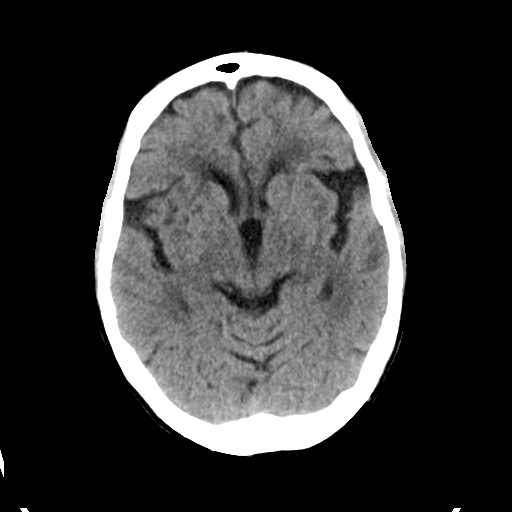
[im 14/32  brain]
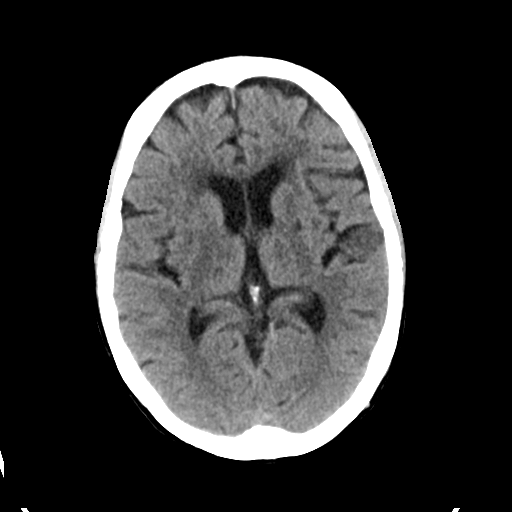
[im 14/32  bone]
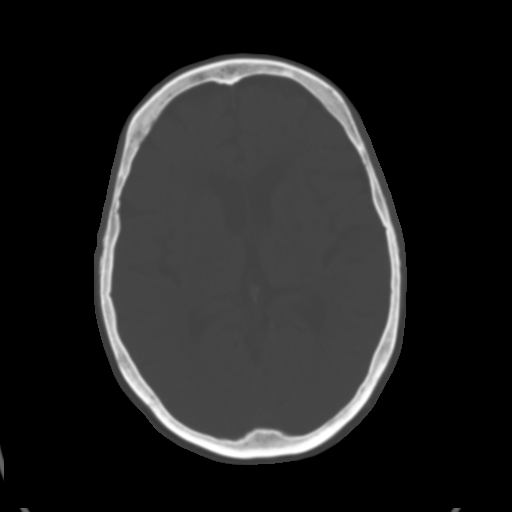
[im 18/32  brain]
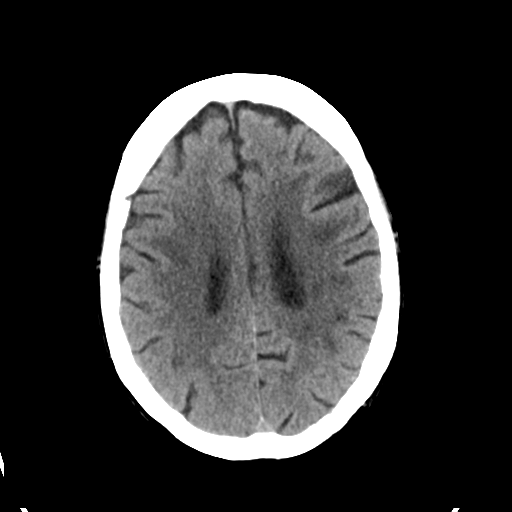
[im 20/32  brain]
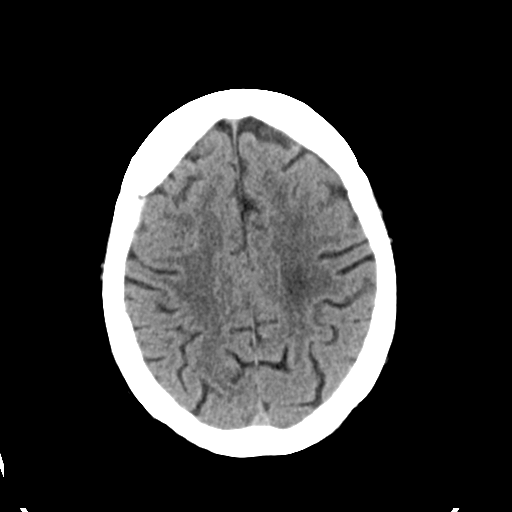
[im 23/32  brain]
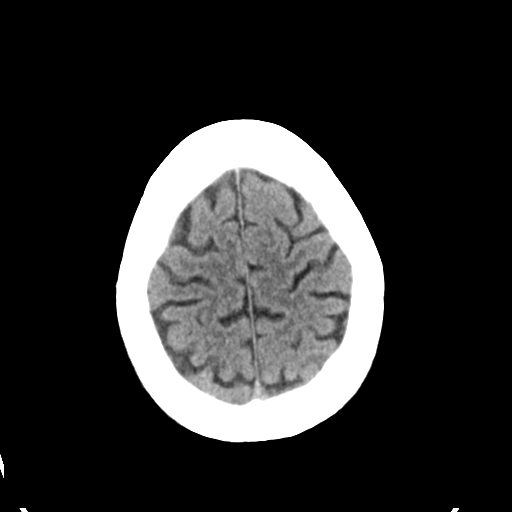
[im 27/32  brain]
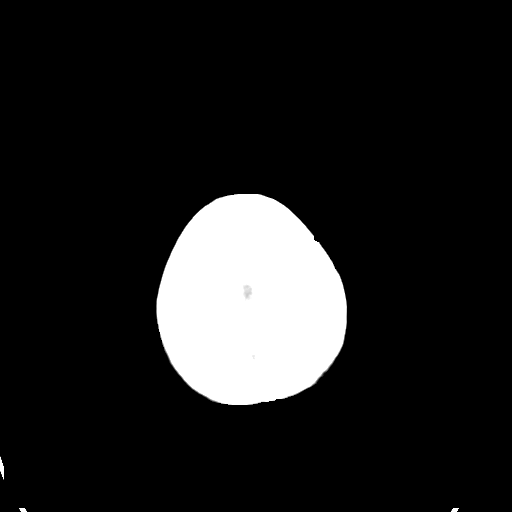
[im 27/32  bone]
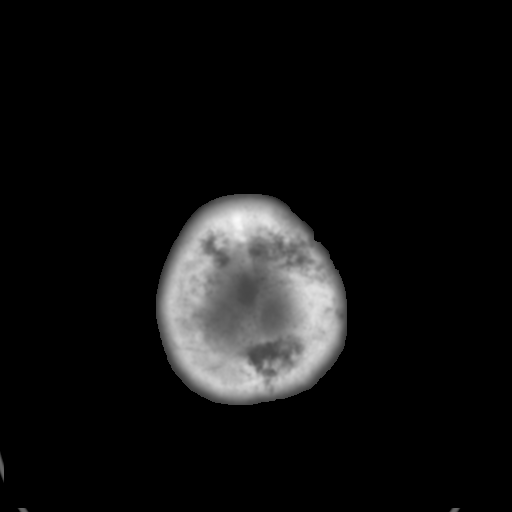
[im 29/32  brain]
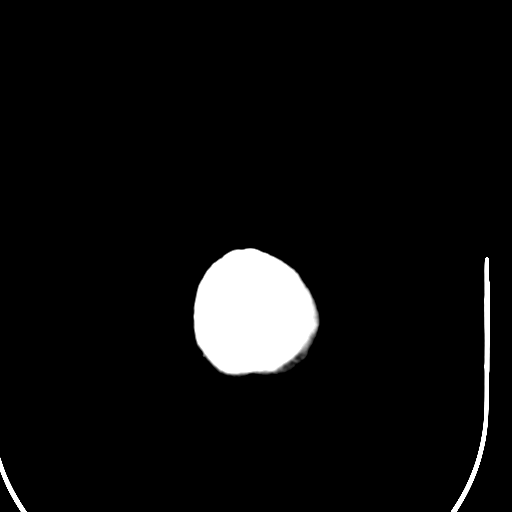

[Series 15: coronal soft tissue · coronal · 0.30mm/px · 3 of 68 slices shown]
[im 17/68  brain]
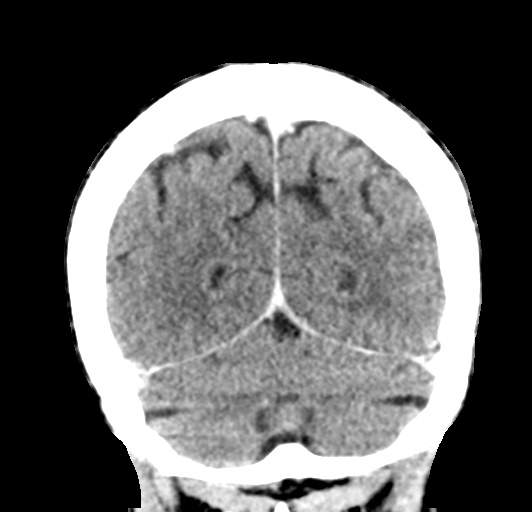
[im 34/68  brain]
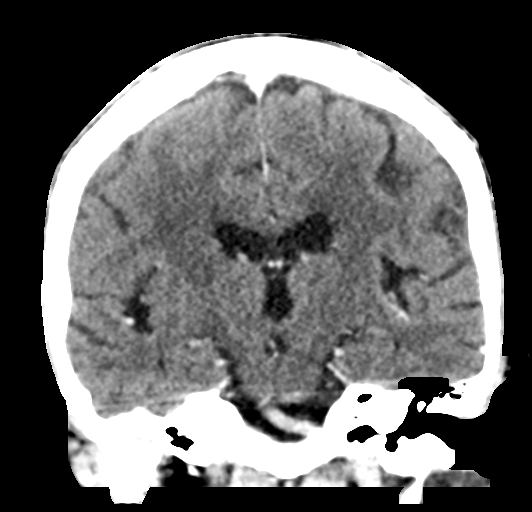
[im 51/68  brain]
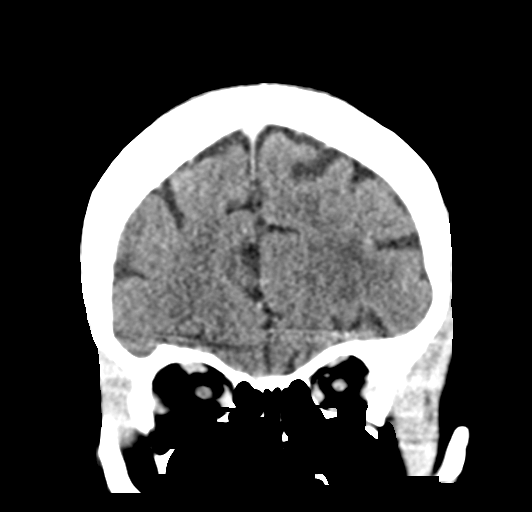

[Series 16: sagittal soft tissue · sagittal · 0.31mm/px · 2 of 56 slices shown]
[im 19/56  brain]
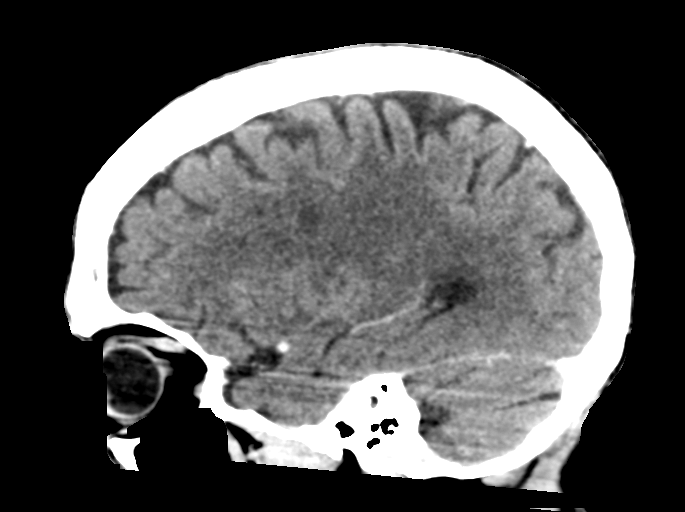
[im 37/56  brain]
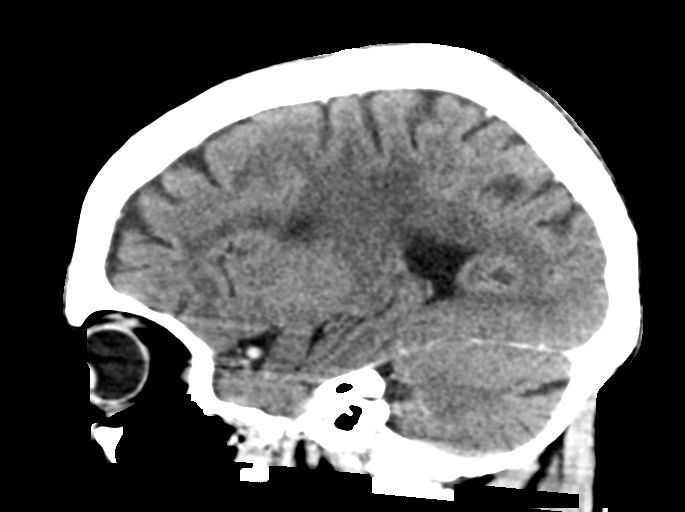

[15 of 47 positions shown; findings below may reference images not displayed]

FINDINGS: Brain: Mild atrophy and moderate diffuse white matter disease is
present. There is no pathologic enhancement to suggest metastatic
disease to the brain. The ventricles are of proportionate to the
degree of atrophy. Remote lacunar infarcts are present in the basal
ganglia bilaterally. Brainstem and cerebellum are normal.

Vascular: Atherosclerotic calcifications are present within the
cavernous internal carotid arteries and at the dural margin the left
vertebral artery. There is no hyperdense vessel.

Skull: Scattered lytic lesions present throughout the calvarium
compatible with known bone metastases. No expansile lesions are
present. An indeterminate scalp soft tissue lesion over the left
parietal skull near the vertex measures 2.5 x 2.4 x 0.6 cm. There is
diffuse infiltration of the calvarium subjacent to this area.

Sinuses/Orbits: The paranasal sinuses and mastoid air cells are
clear. The globes and orbits are within normal limits.
IMPRESSION: 1. No evidence for metastatic disease to brain.
2. Extensive osseous metastases throughout the skull.
3. Focal soft tissue in the high left parietal scalp. This could
represent a metastatic deposit.

## 2020-09-10 ENCOUNTER — Other Ambulatory Visit: Payer: Self-pay | Admitting: Hematology and Oncology
